# Patient Record
Sex: Female | Born: 1960 | Race: White | Hispanic: No | Marital: Married | Smoking: Never smoker
Health system: Southern US, Community
[De-identification: ages and names within clinical notes are randomized; demographics above are authoritative.]

## PROBLEM LIST (undated history)

## (undated) DIAGNOSIS — R740 Nonspecific elevation of levels of transaminase and lactic acid dehydrogenase [LDH]: Secondary | ICD-10-CM

## (undated) DIAGNOSIS — K2970 Gastritis, unspecified, without bleeding: Secondary | ICD-10-CM

## (undated) DIAGNOSIS — R7401 Elevation of levels of liver transaminase levels: Secondary | ICD-10-CM

## (undated) DIAGNOSIS — N951 Menopausal and female climacteric states: Secondary | ICD-10-CM

## (undated) DIAGNOSIS — J329 Chronic sinusitis, unspecified: Secondary | ICD-10-CM

## (undated) DIAGNOSIS — M549 Dorsalgia, unspecified: Secondary | ICD-10-CM

## (undated) DIAGNOSIS — J309 Allergic rhinitis, unspecified: Secondary | ICD-10-CM

## (undated) DIAGNOSIS — R7402 Elevation of levels of lactic acid dehydrogenase (LDH): Secondary | ICD-10-CM

## (undated) DIAGNOSIS — K2990 Gastroduodenitis, unspecified, without bleeding: Secondary | ICD-10-CM

## (undated) DIAGNOSIS — M2550 Pain in unspecified joint: Secondary | ICD-10-CM

## (undated) DIAGNOSIS — S92909A Unspecified fracture of unspecified foot, initial encounter for closed fracture: Secondary | ICD-10-CM

## (undated) DIAGNOSIS — R51 Headache: Secondary | ICD-10-CM

## (undated) HISTORY — DX: Pain in unspecified joint: M25.50

## (undated) HISTORY — PX: COLONOSCOPY: SHX174

## (undated) HISTORY — DX: Headache: R51

## (undated) HISTORY — DX: Gastroduodenitis, unspecified, without bleeding: K29.90

## (undated) HISTORY — DX: Allergic rhinitis, unspecified: J30.9

## (undated) HISTORY — DX: Unspecified fracture of unspecified foot, initial encounter for closed fracture: S92.909A

## (undated) HISTORY — DX: Nonspecific elevation of levels of transaminase and lactic acid dehydrogenase (ldh): R74.0

## (undated) HISTORY — DX: Chronic sinusitis, unspecified: J32.9

## (undated) HISTORY — DX: Dorsalgia, unspecified: M54.9

## (undated) HISTORY — DX: Gastritis, unspecified, without bleeding: K29.70

## (undated) HISTORY — DX: Elevation of levels of liver transaminase levels: R74.01

## (undated) HISTORY — DX: Elevation of levels of lactic acid dehydrogenase (LDH): R74.02

## (undated) HISTORY — DX: Menopausal and female climacteric states: N95.1

---

## 1999-09-12 ENCOUNTER — Other Ambulatory Visit: Admission: RE | Admit: 1999-09-12 | Discharge: 1999-09-12 | Payer: Self-pay | Admitting: Obstetrics & Gynecology

## 2000-10-14 ENCOUNTER — Other Ambulatory Visit: Admission: RE | Admit: 2000-10-14 | Discharge: 2000-10-14 | Payer: Self-pay | Admitting: Obstetrics & Gynecology

## 2000-12-27 ENCOUNTER — Encounter: Payer: Self-pay | Admitting: Internal Medicine

## 2000-12-27 ENCOUNTER — Encounter: Admission: RE | Admit: 2000-12-27 | Discharge: 2000-12-27 | Payer: Self-pay | Admitting: Internal Medicine

## 2001-10-21 ENCOUNTER — Other Ambulatory Visit: Admission: RE | Admit: 2001-10-21 | Discharge: 2001-10-21 | Payer: Self-pay | Admitting: Obstetrics & Gynecology

## 2002-01-26 ENCOUNTER — Emergency Department (HOSPITAL_COMMUNITY): Admission: EM | Admit: 2002-01-26 | Discharge: 2002-01-26 | Payer: Self-pay | Admitting: Emergency Medicine

## 2002-01-26 ENCOUNTER — Encounter: Payer: Self-pay | Admitting: Emergency Medicine

## 2002-05-29 ENCOUNTER — Encounter: Payer: Self-pay | Admitting: Obstetrics & Gynecology

## 2002-05-29 ENCOUNTER — Encounter: Admission: RE | Admit: 2002-05-29 | Discharge: 2002-05-29 | Payer: Self-pay | Admitting: Obstetrics & Gynecology

## 2002-11-18 ENCOUNTER — Other Ambulatory Visit: Admission: RE | Admit: 2002-11-18 | Discharge: 2002-11-18 | Payer: Self-pay | Admitting: Obstetrics & Gynecology

## 2003-09-01 ENCOUNTER — Ambulatory Visit: Admission: RE | Admit: 2003-09-01 | Discharge: 2003-09-01 | Payer: Self-pay | Admitting: Orthopedic Surgery

## 2003-10-13 ENCOUNTER — Emergency Department (HOSPITAL_COMMUNITY): Admission: EM | Admit: 2003-10-13 | Discharge: 2003-10-13 | Payer: Self-pay | Admitting: Emergency Medicine

## 2003-11-23 ENCOUNTER — Other Ambulatory Visit: Admission: RE | Admit: 2003-11-23 | Discharge: 2003-11-23 | Payer: Self-pay | Admitting: Obstetrics and Gynecology

## 2003-11-30 ENCOUNTER — Encounter: Admission: RE | Admit: 2003-11-30 | Discharge: 2003-11-30 | Payer: Self-pay | Admitting: Orthopedic Surgery

## 2003-11-30 IMAGING — NM NM BONE 3 PHASE
10 series · 10 of 10 positions shown · non-contrast
Comparison: none

CLINICAL DATA: Tendonitis.  Rule out stress fracture.

[st statics, dual detec · 2.33mm/px · 1 of 1 slices shown (1 of 5)]
[im 1/1]
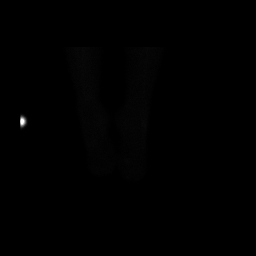

[bf bone flow · 2.36mm/px · 1 of 1 slices shown (1 of 5)]
[im 1/1]
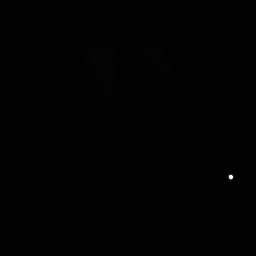

[bf bone flow · 2.33mm/px · 1 of 1 slices shown (2 of 5)]
[im 1/1]
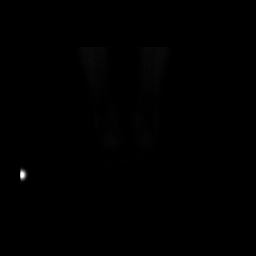

[st statics, dual detec · 2.36mm/px · 1 of 1 slices shown (2 of 5)]
[im 1/1]
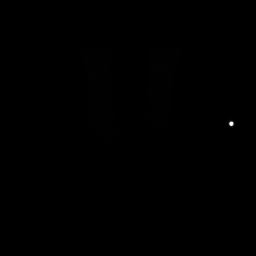

[st statics, dual detec · 2.33mm/px · 1 of 1 slices shown (3 of 5)]
[im 1/1]
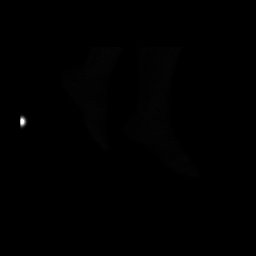

[st statics, dual detec · 2.36mm/px · 1 of 1 slices shown (4 of 5)]
[im 1/1]
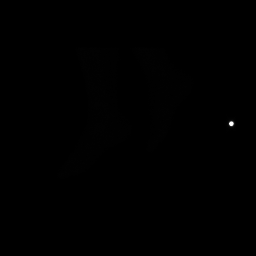

[bf bone flow · 2.33mm/px · 1 of 1 slices shown (3 of 5)]
[im 1/1]
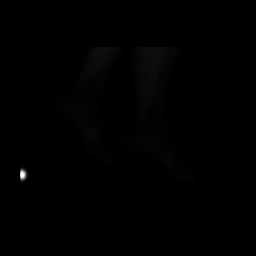

[bf bone flow · 2.36mm/px · 1 of 1 slices shown (4 of 5)]
[im 1/1]
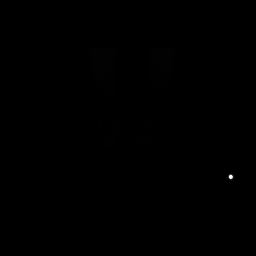

[st statics, dual detec · 2.36mm/px · 1 of 1 slices shown (5 of 5)]
[im 1/1]
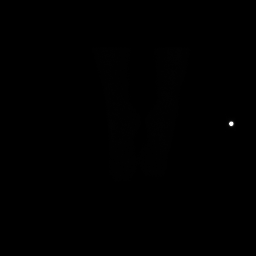

[bf bone flow · 2.36mm/px · 1 of 1 slices shown (5 of 5)]
[im 1/1]
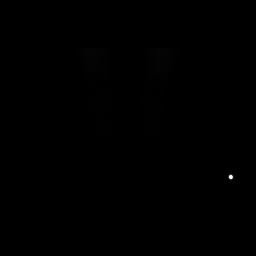

[10 of 10 positions shown; findings below may reference images not displayed]

NUCLEAR MEDICINE BONE SCAN ? THREE PHASE STUDY

 Following the IV injection of 23.1 mCi of [JQ] labeled MDP, flow scans were obtained followed by static soft tissue images and static bone images of the ankles and feet.  There are no areas of increased uptake seen to suggest a stress fracture.  Flow and soft tissue images do not show significant asymmetry. 

 IMPRESSION

 Normal study.

## 2003-12-30 ENCOUNTER — Ambulatory Visit (HOSPITAL_BASED_OUTPATIENT_CLINIC_OR_DEPARTMENT_OTHER): Admission: RE | Admit: 2003-12-30 | Discharge: 2003-12-30 | Payer: Self-pay | Admitting: Orthopedic Surgery

## 2003-12-30 ENCOUNTER — Encounter (INDEPENDENT_AMBULATORY_CARE_PROVIDER_SITE_OTHER): Payer: Self-pay | Admitting: *Deleted

## 2003-12-30 ENCOUNTER — Ambulatory Visit (HOSPITAL_COMMUNITY): Admission: RE | Admit: 2003-12-30 | Discharge: 2003-12-30 | Payer: Self-pay | Admitting: Orthopedic Surgery

## 2004-08-20 HISTORY — PX: ESOPHAGOGASTRODUODENOSCOPY: SHX1529

## 2004-08-20 HISTORY — PX: OTHER SURGICAL HISTORY: SHX169

## 2004-11-09 ENCOUNTER — Ambulatory Visit: Payer: Self-pay | Admitting: Family Medicine

## 2004-12-05 ENCOUNTER — Other Ambulatory Visit: Admission: RE | Admit: 2004-12-05 | Discharge: 2004-12-05 | Payer: Self-pay | Admitting: Obstetrics & Gynecology

## 2005-01-08 ENCOUNTER — Ambulatory Visit: Payer: Self-pay | Admitting: Family Medicine

## 2005-03-21 ENCOUNTER — Ambulatory Visit: Payer: Self-pay | Admitting: Family Medicine

## 2005-05-03 ENCOUNTER — Ambulatory Visit: Payer: Self-pay | Admitting: Family Medicine

## 2005-07-10 ENCOUNTER — Ambulatory Visit (HOSPITAL_COMMUNITY): Admission: EM | Admit: 2005-07-10 | Discharge: 2005-07-11 | Payer: Self-pay | Admitting: Emergency Medicine

## 2005-07-10 ENCOUNTER — Encounter (INDEPENDENT_AMBULATORY_CARE_PROVIDER_SITE_OTHER): Payer: Self-pay | Admitting: Specialist

## 2005-07-10 IMAGING — US US ABDOMEN COMPLETE
1 series · 14 of 25 positions shown · non-contrast
Comparison: None

CLINICAL DATA: Epigastric pain

ABDOMEN ULTRASOUND
TECHNIQUE: Complete abdominal ultrasound examination was performed including
evaluation of the liver, gallbladder, bile ducts, pancreas, kidneys, spleen,
IVC, and abdominal aorta.

[Series 1: unknown · 0.33mm/px · 14 of 50 slices shown]
[im 1/50]
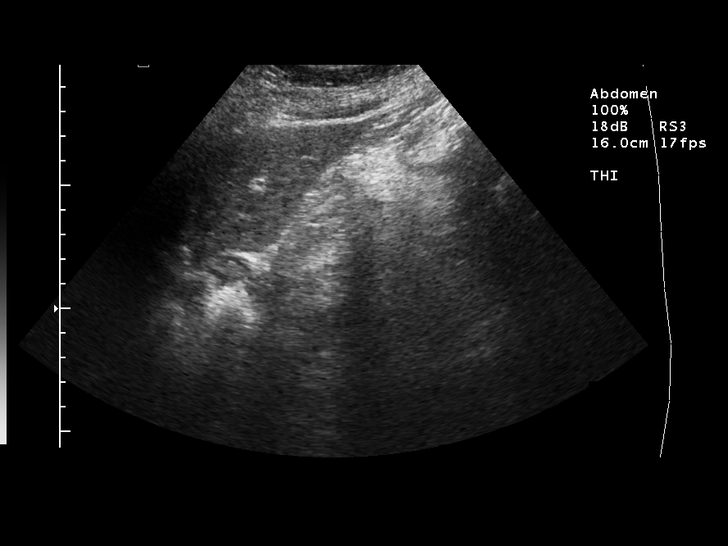
[im 5/50]
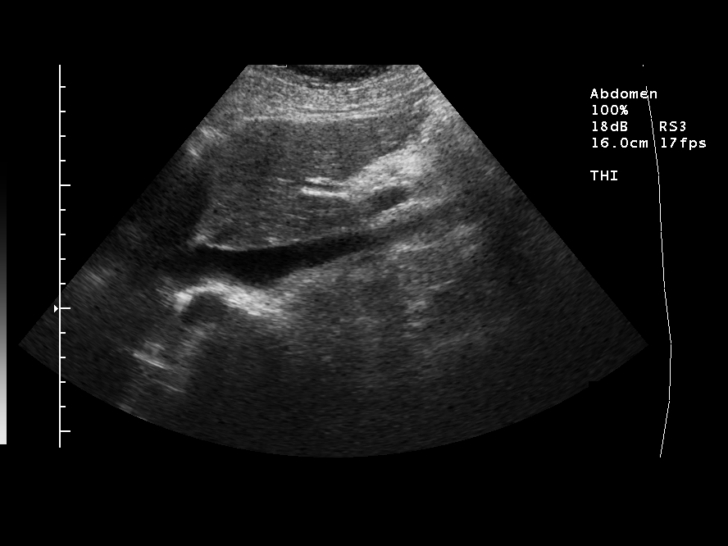
[im 9/50]
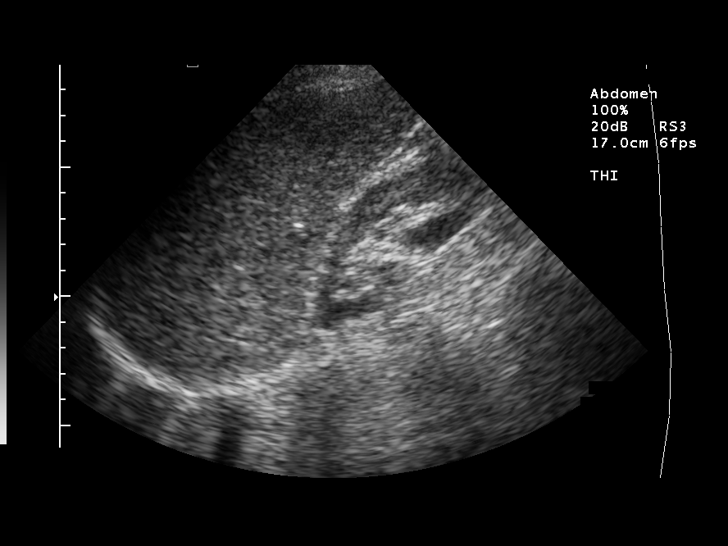
[im 13/50]
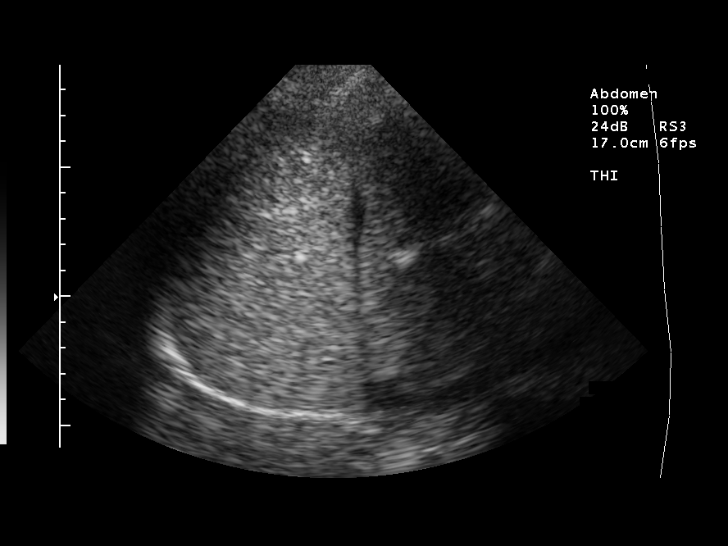
[im 17/50]
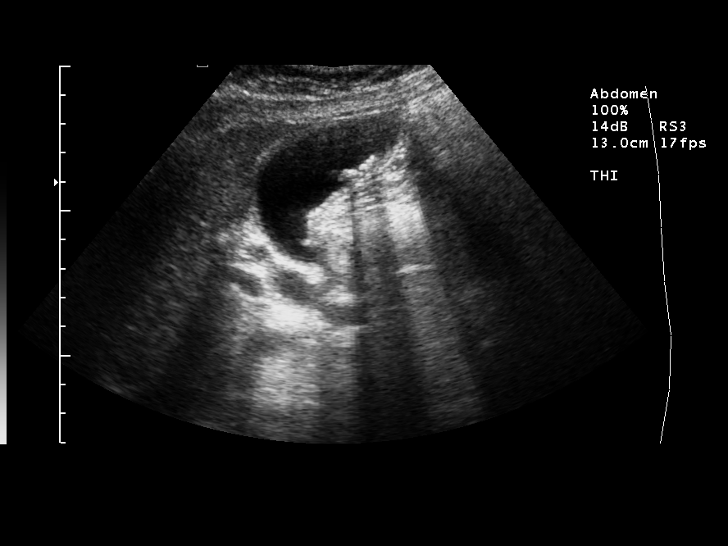
[im 19/50]
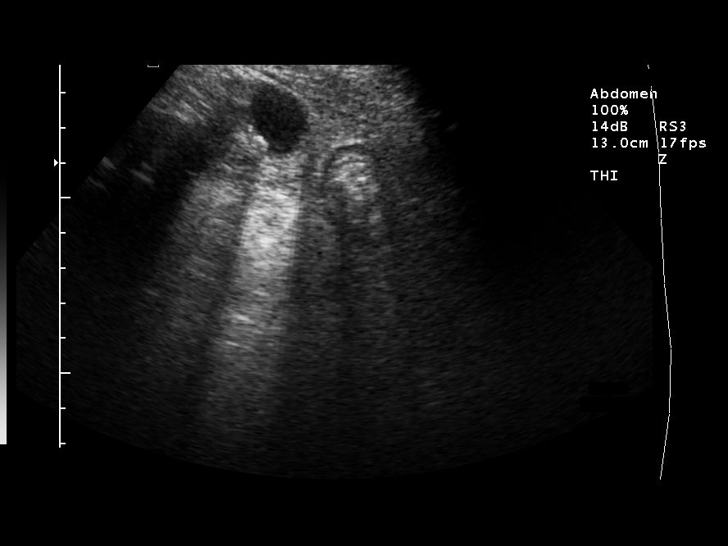
[im 23/50]
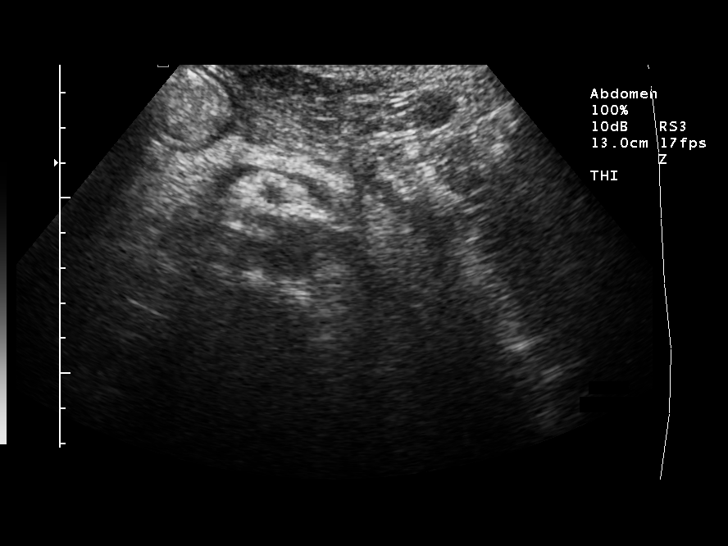
[im 27/50]
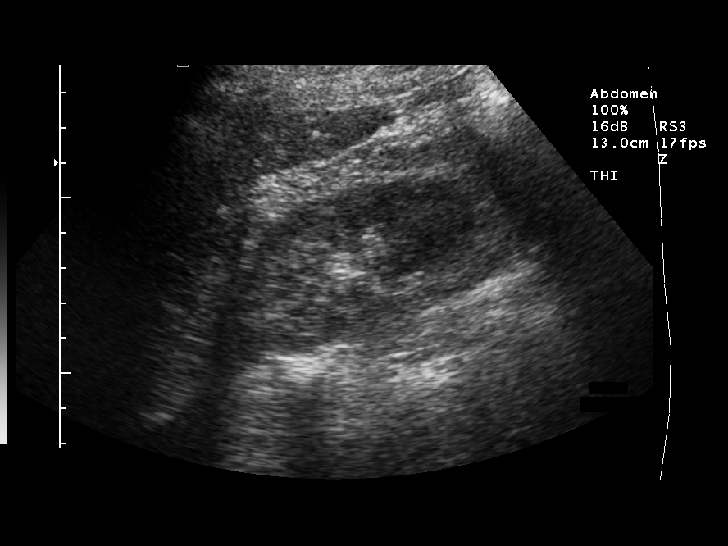
[im 31/50]
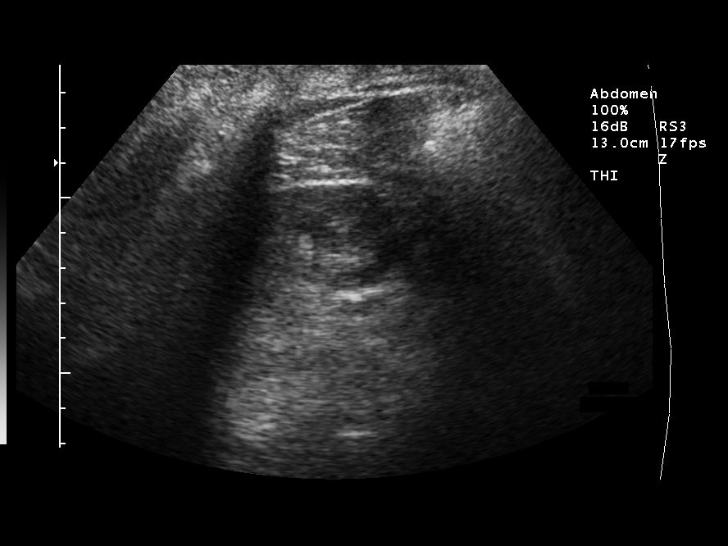
[im 33/50]
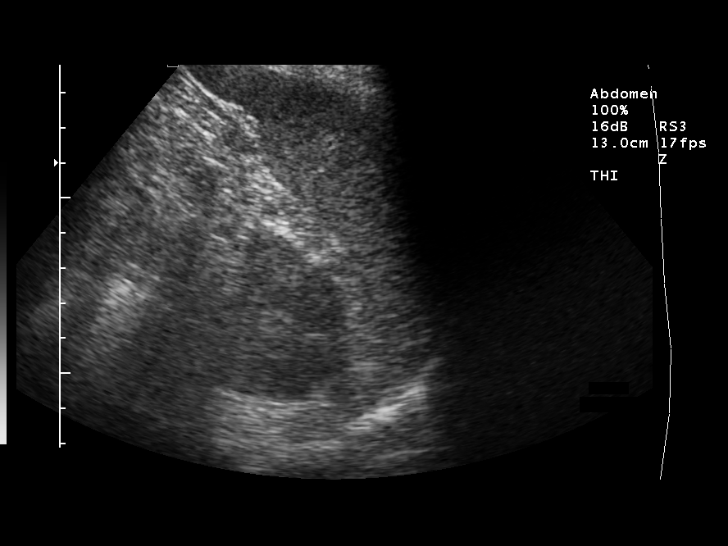
[im 37/50]
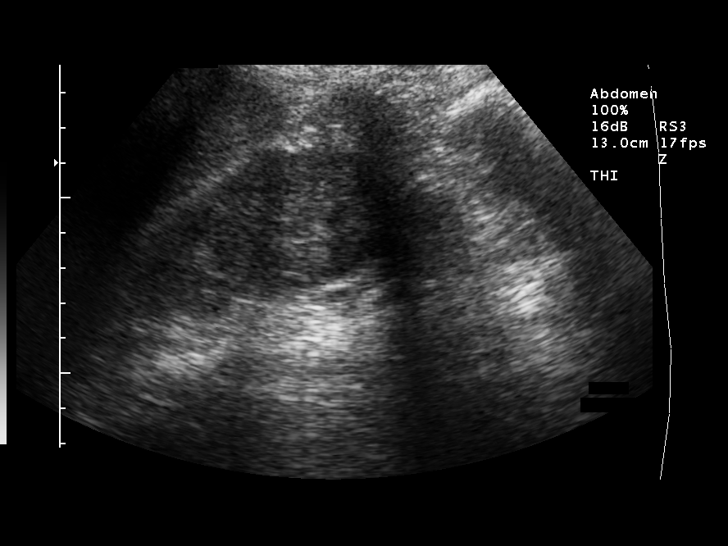
[im 41/50]
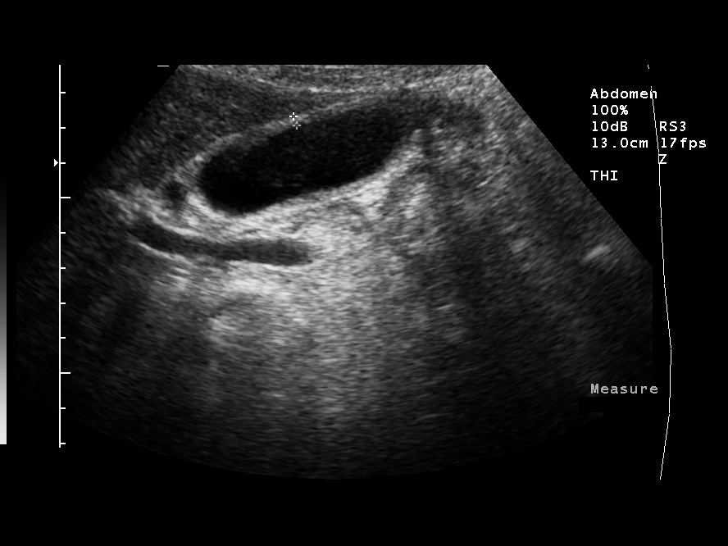
[im 45/50]
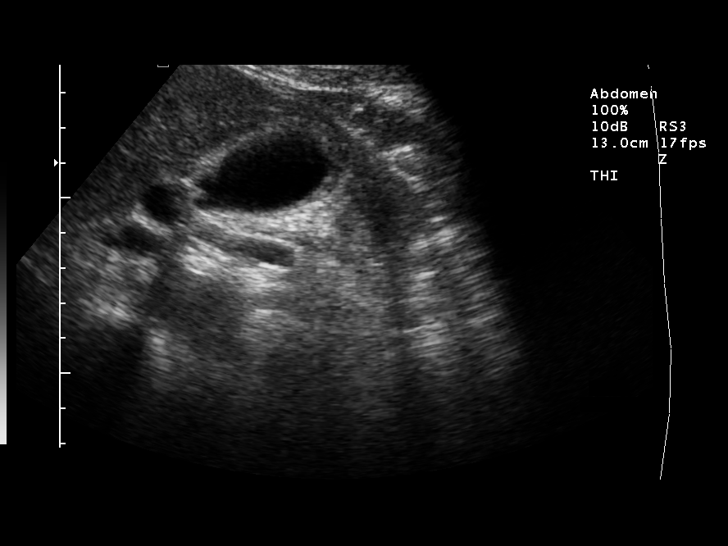
[im 50/50]
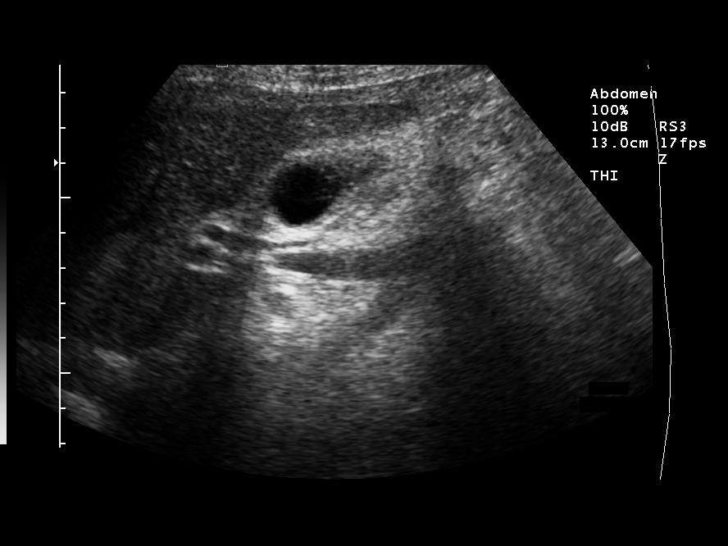

[14 of 25 positions shown; findings below may reference images not displayed]

FINDINGS: Small gallstones noted within the gallbladder. Gallbladder wall is
borderline in thickness at 3 mm. Common bile duct is normal at 5 mm. No focal
lesions in the liver, spleen, pancreas, or kidneys. Visualized IVC and abdominal
aorta unremarkable.

IMPRESSION

Cholelithiasis. Borderline gallbladder wall thickening. Can't exclude early
acute cholecystitis.

## 2005-07-10 IMAGING — CR DG CHEST 2V
2 series · 2 of 2 positions shown · non-contrast
Comparison: None.

CLINICAL DATA: Epigastric pain, colonoscopy yesterday.
 CHEST ? 2 VIEW:

[view not recorded (1 of 2)]
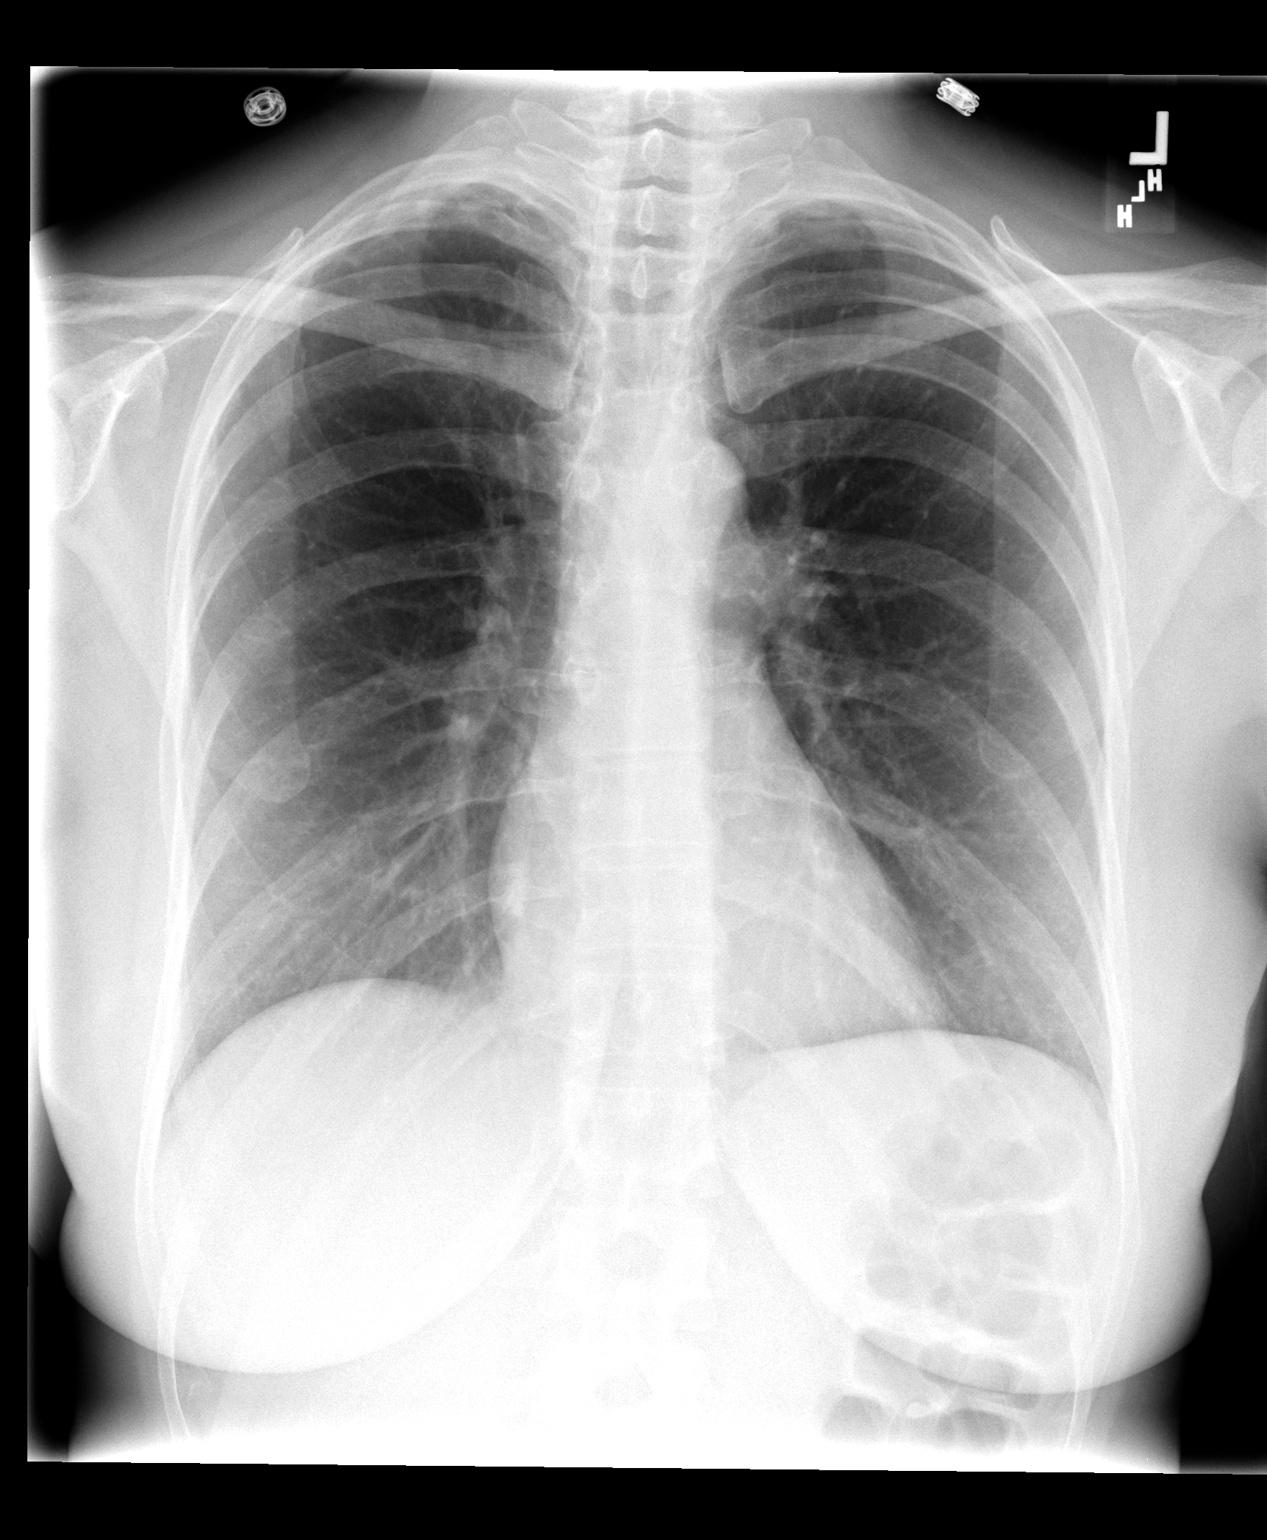

[view not recorded (2 of 2)]
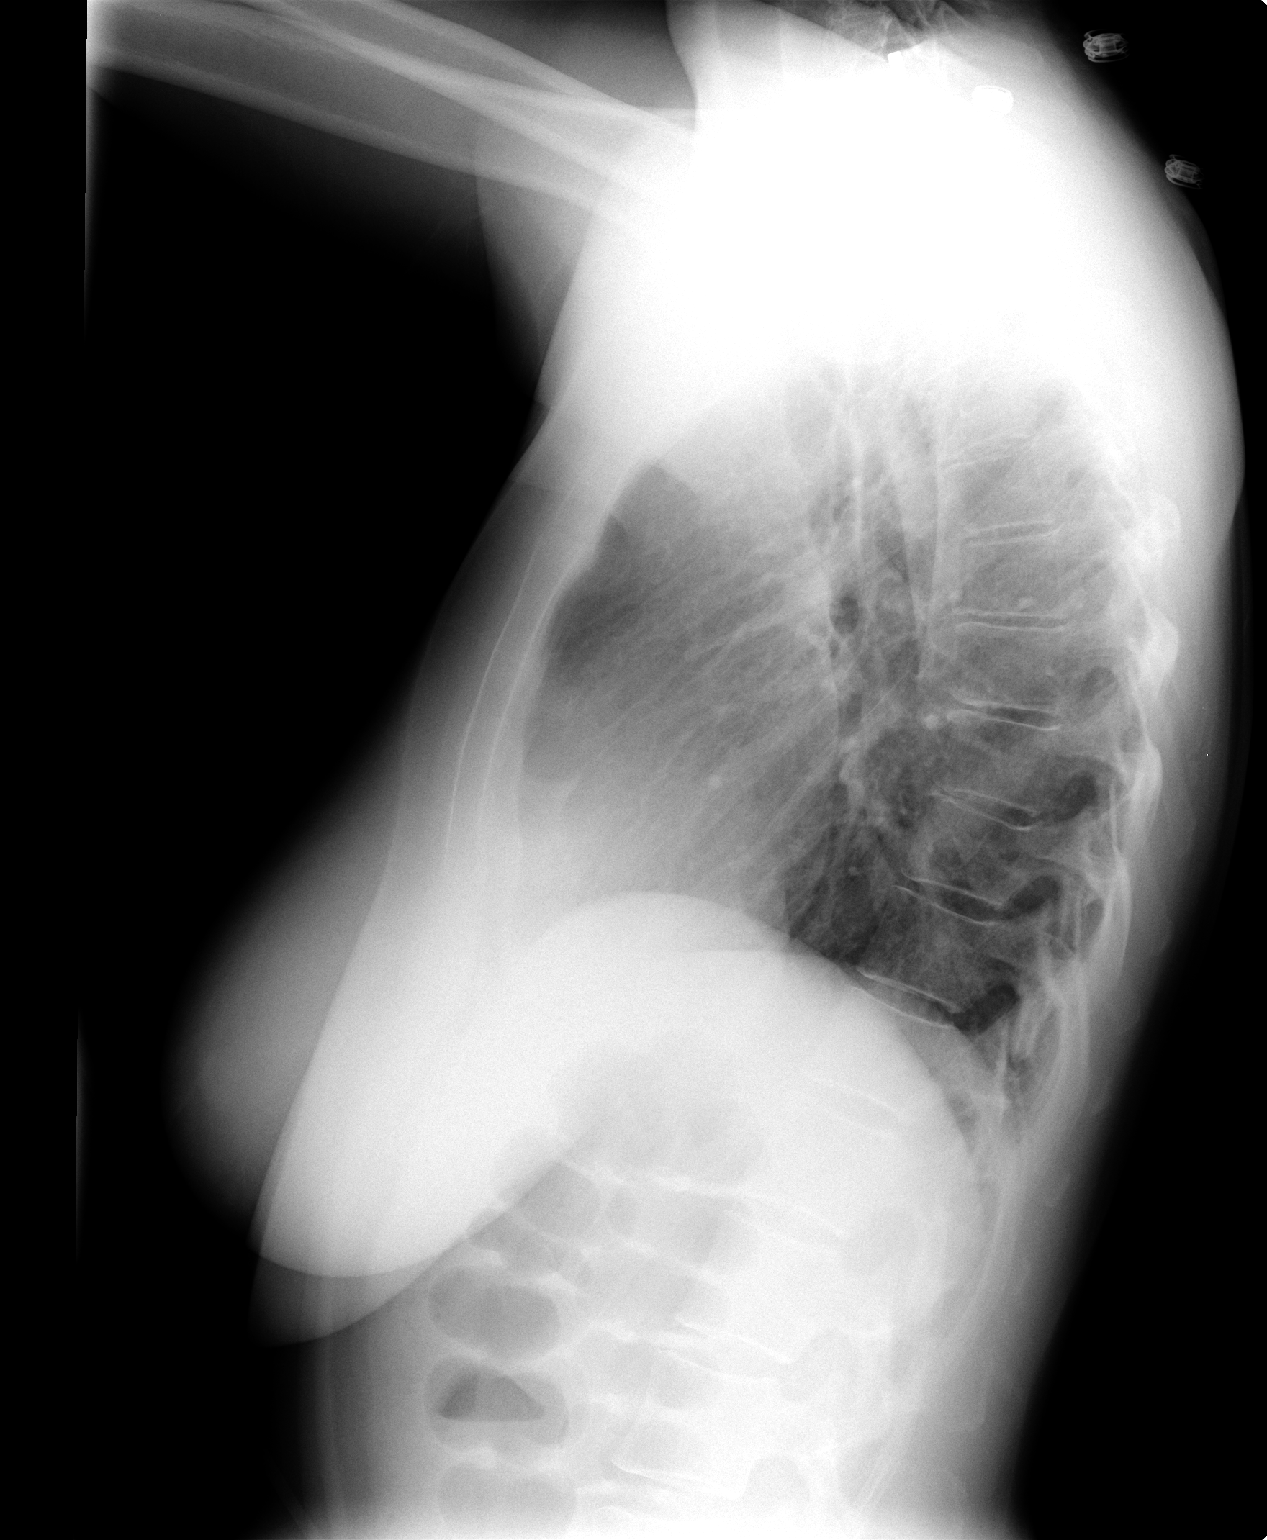

[2 of 2 positions shown; findings below may reference images not displayed]

FINDINGS: Heart size is normal.  There is no heart failure, infiltrate or effusion.  There is no free air under the diaphragm.  There is apical scarring bilaterally.
IMPRESSION: No acute abnormality.

## 2005-07-10 IMAGING — RF DG CHOLANGIOGRAM OPERATIVE
1 series · 3 of 3 positions shown · non-contrast
Comparison: none

CLINICAL DATA: Cholelithiasis.

INTRAOPERATIVE CHOLANGIOGRAM:
TECHNIQUE: Multiple fluoroscopic spot radiographs were obtained during
intraoperative cholangiogram, and are submitted for interpretation
post-operatively.

[Series 1: run · 3 of 3 slices shown]
[im 1/3]
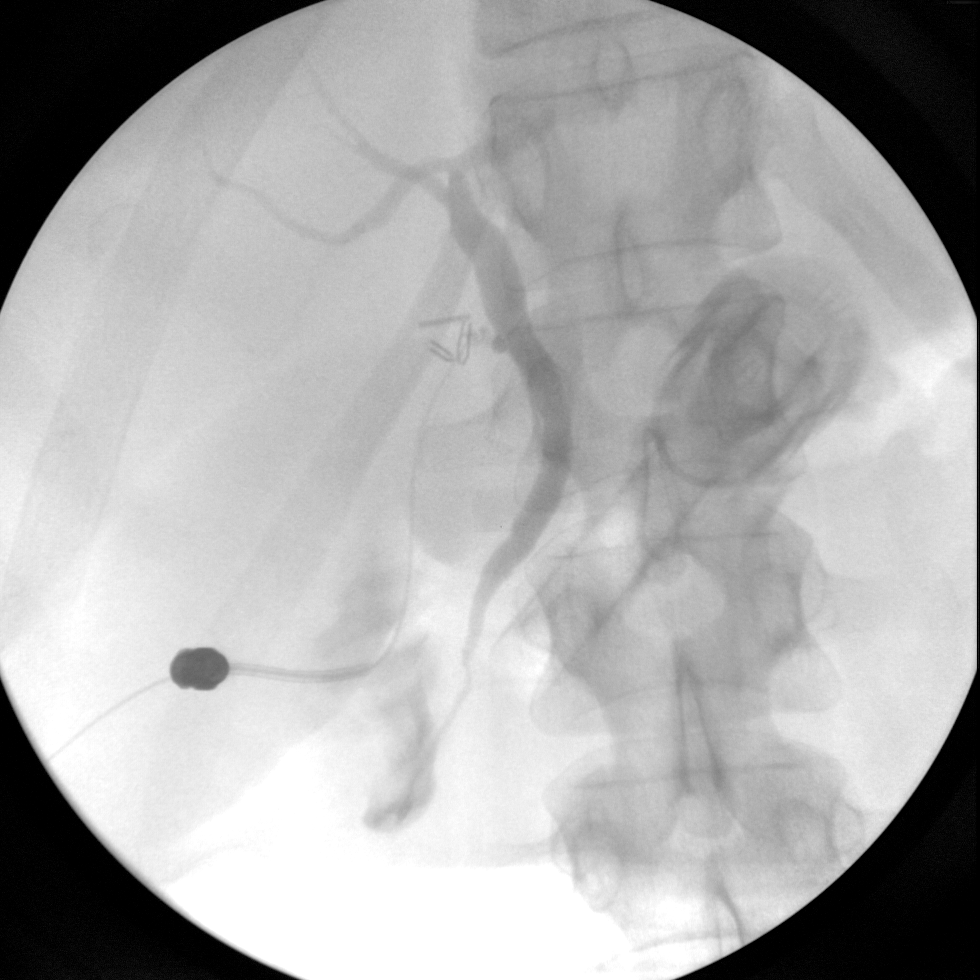
[im 2/3]
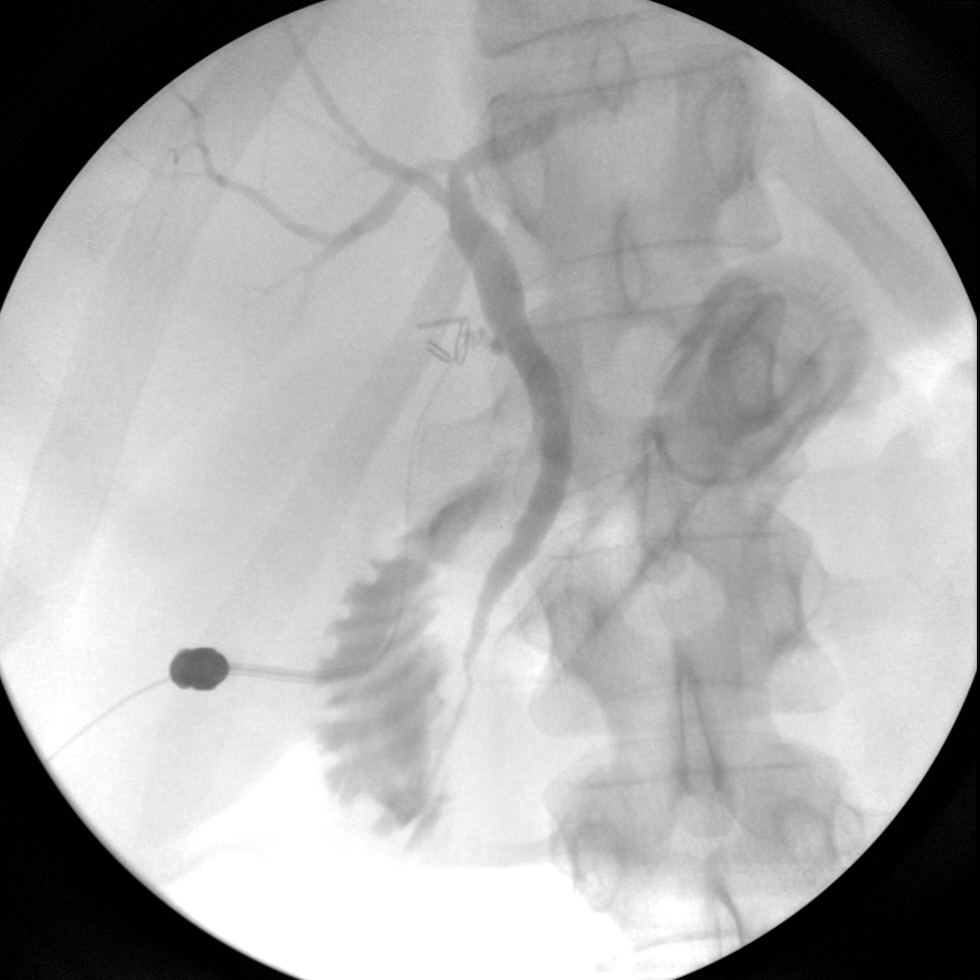
[im 3/3]
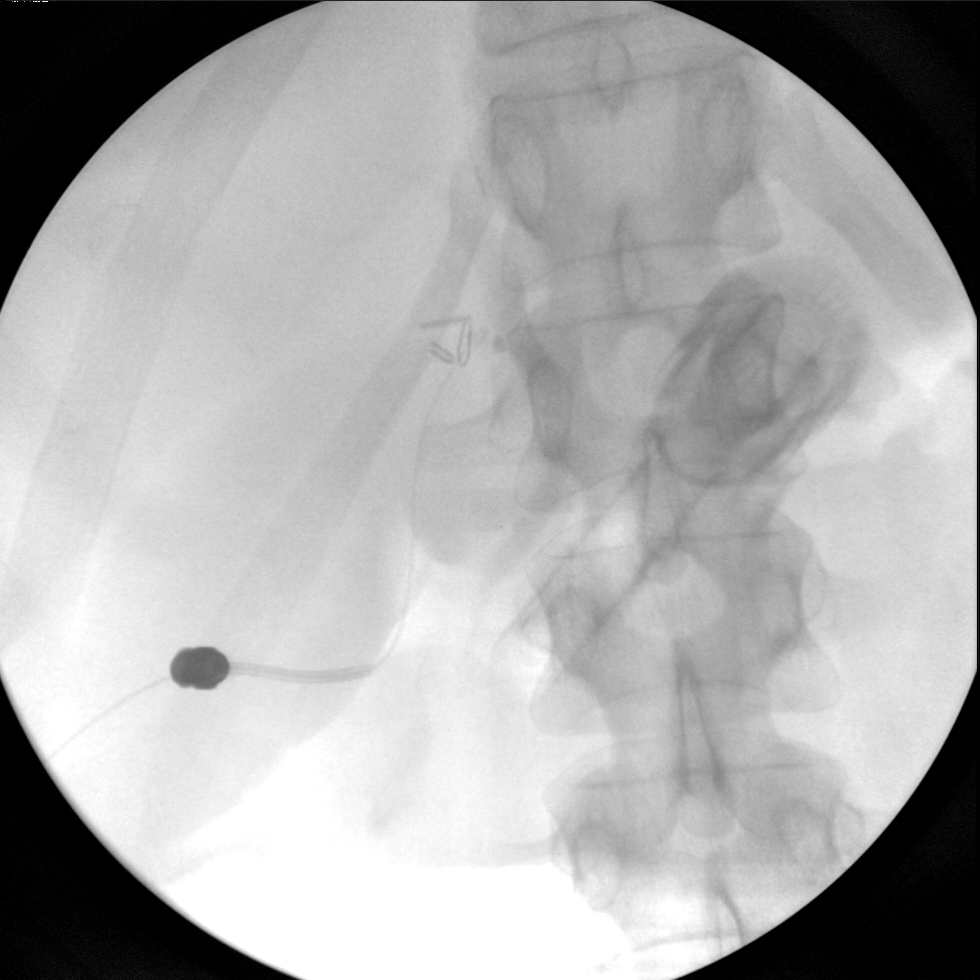

[3 of 3 positions shown; findings below may reference images not displayed]

FINDINGS: No calculi are identified within the common bile duct.  There is no
evidence of biliary stricture or obstruction.
IMPRESSION: Negative intraoperative cholangiogram.

## 2006-02-27 ENCOUNTER — Ambulatory Visit: Payer: Self-pay | Admitting: Family Medicine

## 2006-07-04 ENCOUNTER — Ambulatory Visit: Payer: Self-pay | Admitting: Family Medicine

## 2006-10-22 ENCOUNTER — Ambulatory Visit: Payer: Self-pay | Admitting: Family Medicine

## 2006-11-12 ENCOUNTER — Ambulatory Visit: Payer: Self-pay | Admitting: Family Medicine

## 2006-12-11 ENCOUNTER — Ambulatory Visit: Payer: Self-pay | Admitting: Family Medicine

## 2007-07-01 ENCOUNTER — Ambulatory Visit: Payer: Self-pay | Admitting: Family Medicine

## 2007-07-02 ENCOUNTER — Telehealth (INDEPENDENT_AMBULATORY_CARE_PROVIDER_SITE_OTHER): Payer: Self-pay | Admitting: *Deleted

## 2007-10-27 ENCOUNTER — Ambulatory Visit: Payer: Self-pay | Admitting: Family Medicine

## 2008-03-05 ENCOUNTER — Ambulatory Visit: Payer: Self-pay | Admitting: Family Medicine

## 2008-07-16 ENCOUNTER — Encounter: Admission: RE | Admit: 2008-07-16 | Discharge: 2008-07-16 | Payer: Self-pay | Admitting: Sports Medicine

## 2008-07-30 ENCOUNTER — Encounter: Admission: RE | Admit: 2008-07-30 | Discharge: 2008-07-30 | Payer: Self-pay | Admitting: Sports Medicine

## 2008-08-20 HISTORY — PX: FOOT FRACTURE SURGERY: SHX645

## 2008-09-16 ENCOUNTER — Ambulatory Visit: Payer: Self-pay | Admitting: Family Medicine

## 2008-09-16 DIAGNOSIS — K2970 Gastritis, unspecified, without bleeding: Secondary | ICD-10-CM | POA: Insufficient documentation

## 2008-09-16 DIAGNOSIS — J309 Allergic rhinitis, unspecified: Secondary | ICD-10-CM | POA: Insufficient documentation

## 2008-09-16 DIAGNOSIS — K2990 Gastroduodenitis, unspecified, without bleeding: Secondary | ICD-10-CM

## 2008-09-17 ENCOUNTER — Telehealth: Payer: Self-pay | Admitting: Family Medicine

## 2008-12-17 ENCOUNTER — Ambulatory Visit: Payer: Self-pay | Admitting: Family Medicine

## 2008-12-17 DIAGNOSIS — J019 Acute sinusitis, unspecified: Secondary | ICD-10-CM | POA: Insufficient documentation

## 2009-01-07 ENCOUNTER — Ambulatory Visit: Payer: Self-pay | Admitting: Family Medicine

## 2009-02-11 ENCOUNTER — Telehealth: Payer: Self-pay | Admitting: Family Medicine

## 2009-04-11 ENCOUNTER — Ambulatory Visit: Payer: Self-pay | Admitting: Family Medicine

## 2009-07-12 ENCOUNTER — Ambulatory Visit: Payer: Self-pay | Admitting: Family Medicine

## 2009-07-12 DIAGNOSIS — N951 Menopausal and female climacteric states: Secondary | ICD-10-CM

## 2009-07-21 ENCOUNTER — Telehealth: Payer: Self-pay | Admitting: Family Medicine

## 2009-11-29 ENCOUNTER — Encounter (INDEPENDENT_AMBULATORY_CARE_PROVIDER_SITE_OTHER): Payer: Self-pay | Admitting: *Deleted

## 2009-12-21 ENCOUNTER — Ambulatory Visit: Payer: Self-pay | Admitting: Family Medicine

## 2009-12-21 DIAGNOSIS — M2550 Pain in unspecified joint: Secondary | ICD-10-CM | POA: Insufficient documentation

## 2009-12-22 ENCOUNTER — Encounter: Payer: Self-pay | Admitting: Family Medicine

## 2009-12-26 LAB — CONVERTED CEMR LAB
BUN: 11 mg/dL (ref 6–23)
CO2: 32 meq/L (ref 19–32)
Chloride: 105 meq/L (ref 96–112)
Creatinine, Ser: 0.7 mg/dL (ref 0.4–1.2)
Eosinophils Absolute: 0.2 10*3/uL (ref 0.0–0.7)
Eosinophils Relative: 3.6 % (ref 0.0–5.0)
GFR calc non Af Amer: 90.81 mL/min (ref >60)
Glucose, Bld: 92 mg/dL (ref 70–99)
HCT: 41 % (ref 36.0–46.0)
Lymphs Abs: 1.8 10*3/uL (ref 0.7–4.0)
MCHC: 34.1 g/dL (ref 30.0–36.0)
Monocytes Relative: 6.2 % (ref 3.0–12.0)
Neutrophils Relative %: 59.7 % (ref 43.0–77.0)
Platelets: 297 10*3/uL (ref 150.0–400.0)
Potassium: 4.1 meq/L (ref 3.5–5.1)
RDW: 12.9 % (ref 11.5–14.6)
TSH: 2.46 microintl units/mL (ref 0.35–5.50)
Total Bilirubin: 0.4 mg/dL (ref 0.3–1.2)
Total Protein: 7.4 g/dL (ref 6.0–8.3)
WBC: 5.9 10*3/uL (ref 4.5–10.5)

## 2009-12-29 ENCOUNTER — Telehealth: Payer: Self-pay | Admitting: Family Medicine

## 2010-01-06 ENCOUNTER — Ambulatory Visit: Payer: Self-pay | Admitting: Family Medicine

## 2010-03-24 ENCOUNTER — Ambulatory Visit: Payer: Self-pay | Admitting: Family Medicine

## 2010-04-03 ENCOUNTER — Telehealth: Payer: Self-pay | Admitting: Family Medicine

## 2010-04-10 ENCOUNTER — Ambulatory Visit: Payer: Self-pay | Admitting: Family Medicine

## 2010-04-10 ENCOUNTER — Ambulatory Visit: Payer: Self-pay | Admitting: Cardiology

## 2010-04-10 ENCOUNTER — Telehealth: Payer: Self-pay | Admitting: Family Medicine

## 2010-04-10 DIAGNOSIS — R51 Headache: Secondary | ICD-10-CM

## 2010-04-10 DIAGNOSIS — R519 Headache, unspecified: Secondary | ICD-10-CM | POA: Insufficient documentation

## 2010-04-10 IMAGING — CT CT PARANASAL SINUSES LIMITED
1 of 2 series · 16 of 25 positions shown, 20 images · non-contrast
Comparison: None.

CLINICAL DATA: Left frontal and facial pain and pressure over the
past 2 weeks which did not resolve with antibiotic therapy.

CT PARANASAL SINUS LIMITED WITHOUT CONTRAST [DATE]:
TECHNIQUE: Multidetector CT images of the paranasal sinuses were
obtained in a single coronal plane without contrast.  A metallic BB
was placed on the right supraorbital region in order to reliably
differentiate right from left.

[Series 4: ltd sinus 3.0 h30s · axial · 0.29mm/px · z∈[-112,-14]mm · 16 of 24 slices shown, 20 images]
[im 2/24  brain]
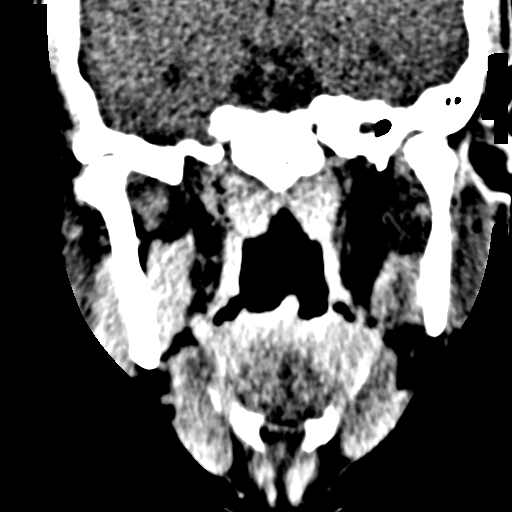
[im 2/24  bone]
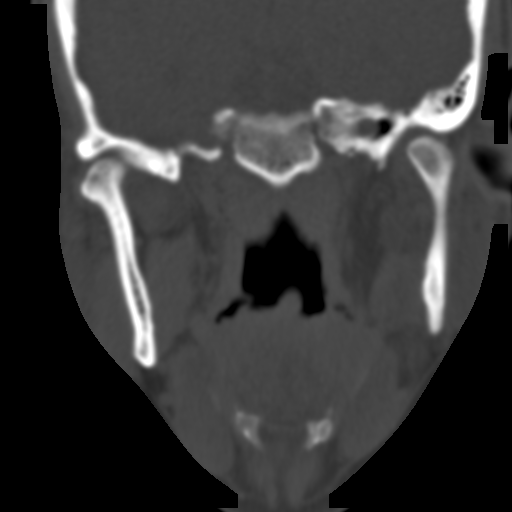
[im 4/24  bone]
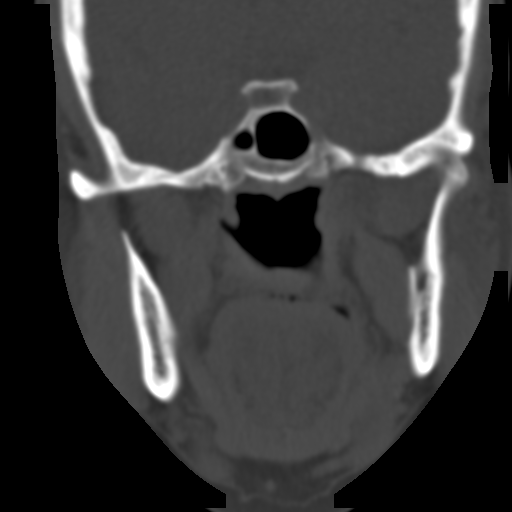
[im 5/24  bone]
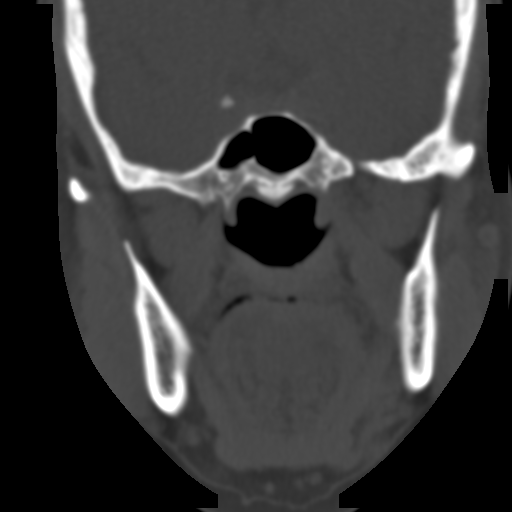
[im 6/24  bone]
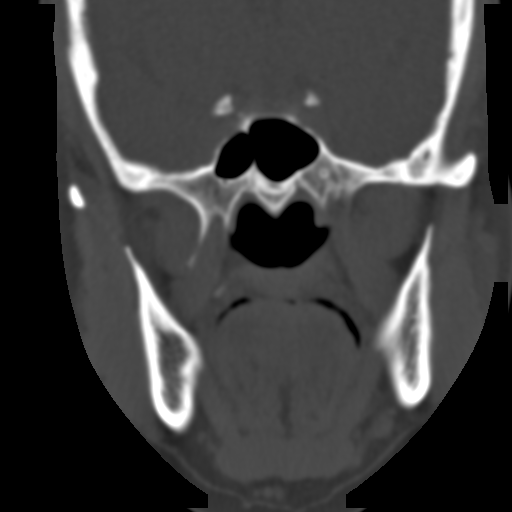
[im 8/24  brain]
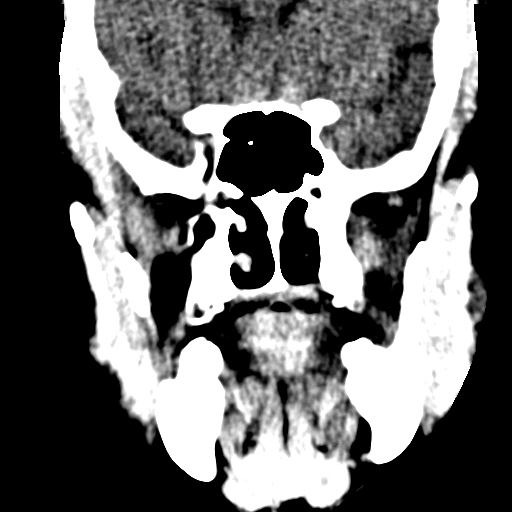
[im 8/24  bone]
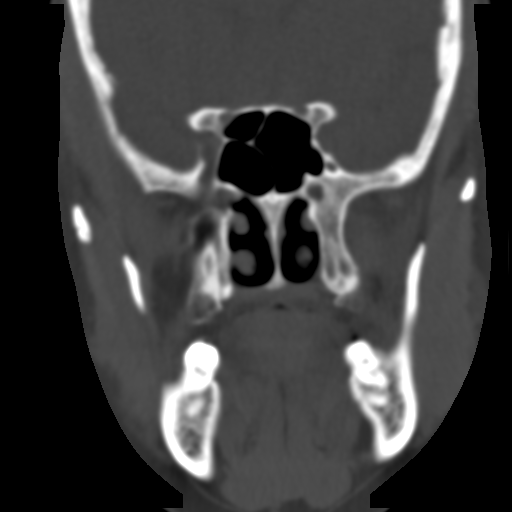
[im 9/24  bone]
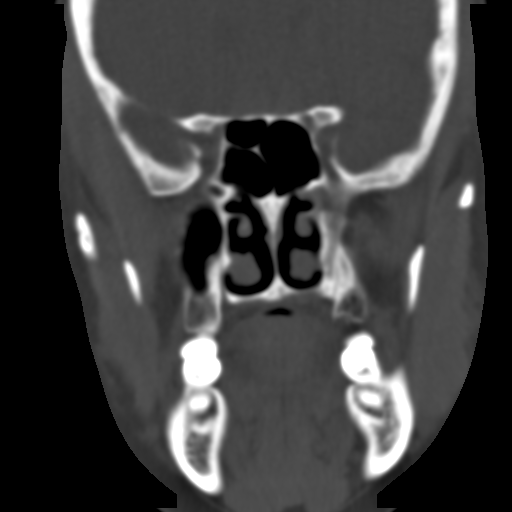
[im 10/24  bone]
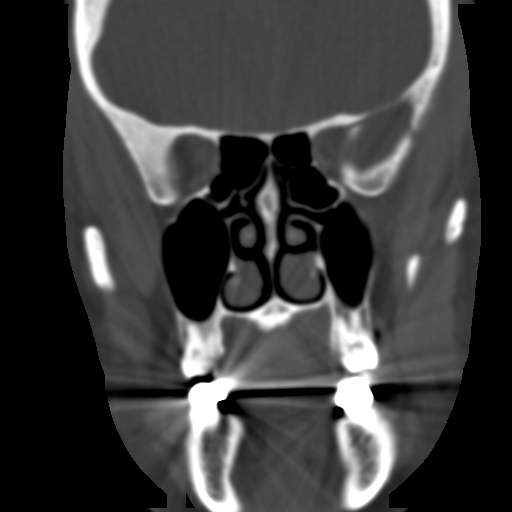
[im 12/24  bone]
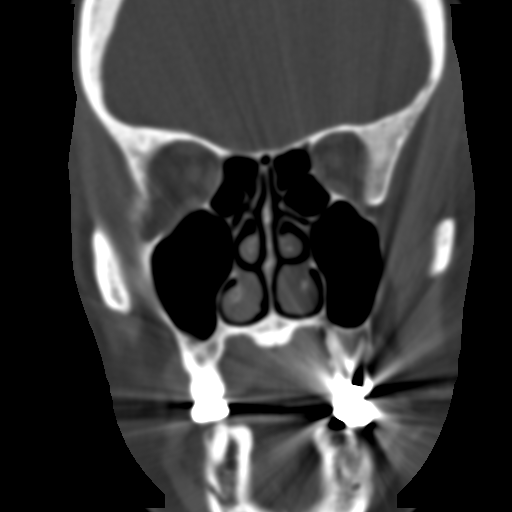
[im 13/24  brain]
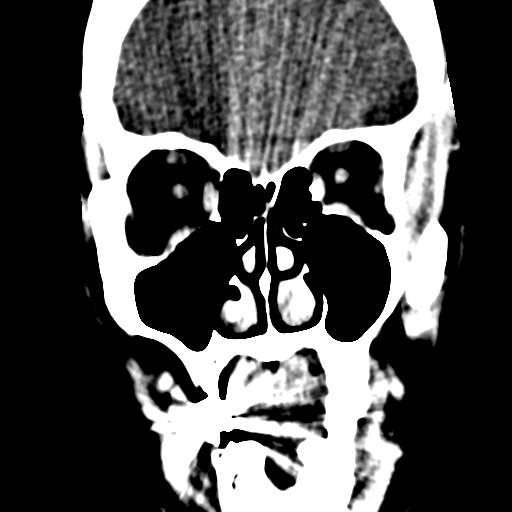
[im 13/24  bone]
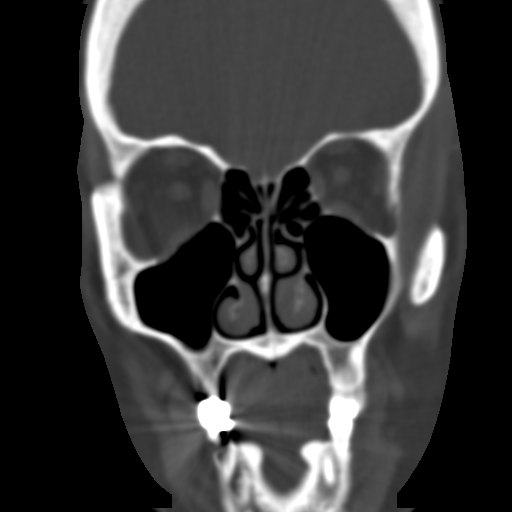
[im 15/24  bone]
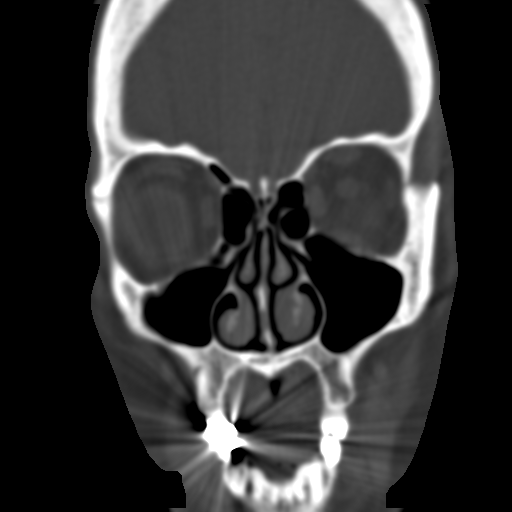
[im 16/24  bone]
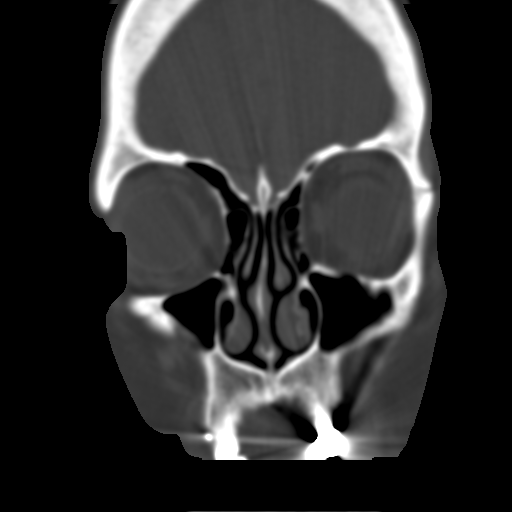
[im 17/24  bone]
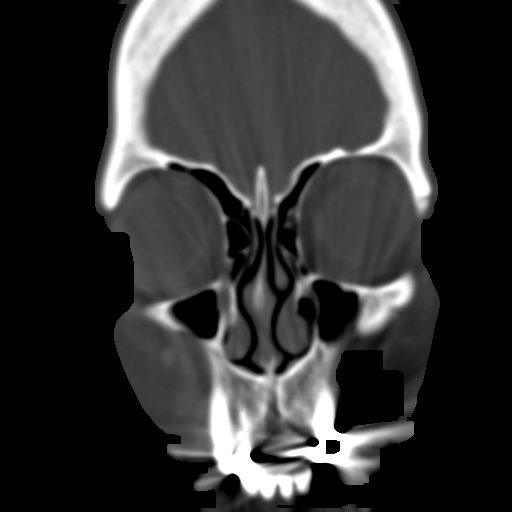
[im 19/24  brain]
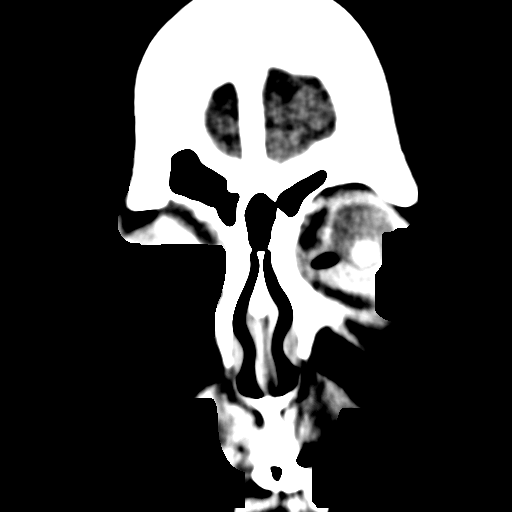
[im 19/24  bone]
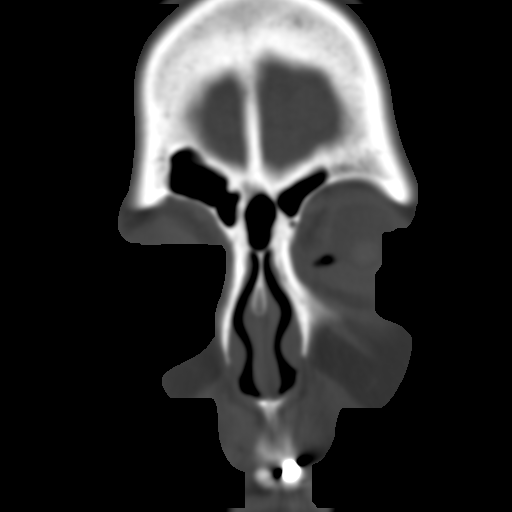
[im 20/24  bone]
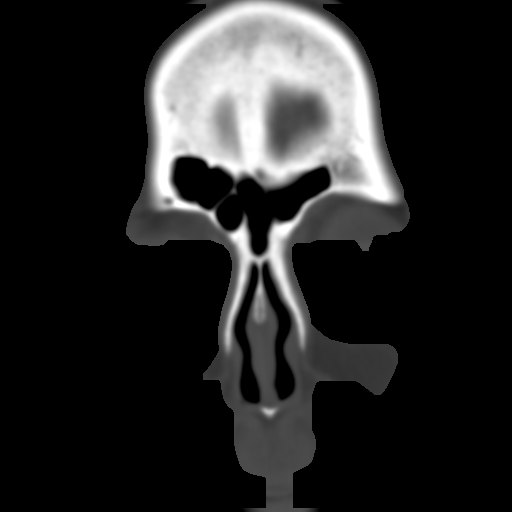
[im 21/24  bone]
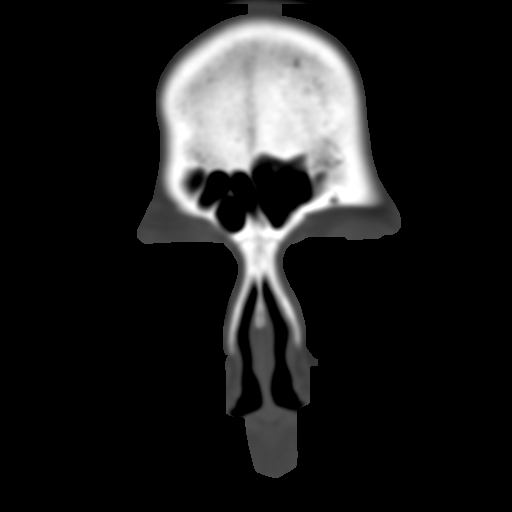
[im 23/24  bone]
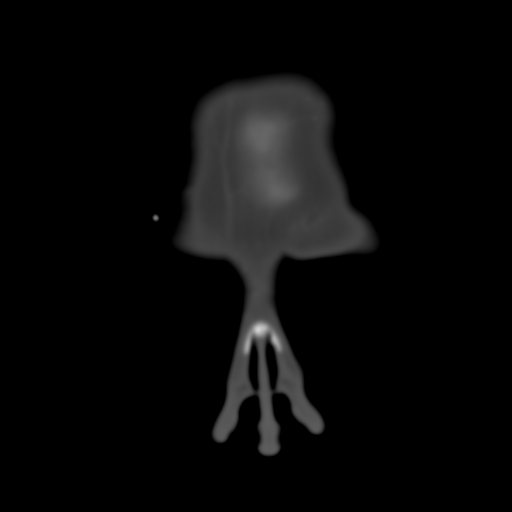

[16 of 25 positions shown; findings below may reference images not displayed]

FINDINGS: All of the paranasal sinuses are well-aerated.  No
evidence of mucous retention cysts or polyps, mucosal thickening,
or air-fluid levels.  Bony nasal septum midline.  No visible
anatomic variants.  Visualized temporomandibular joints intact.
IMPRESSION: No evidence of sinusitis.

## 2010-04-11 LAB — CONVERTED CEMR LAB
Basophils Absolute: 0 10*3/uL (ref 0.0–0.1)
Basophils Relative: 0.5 % (ref 0.0–3.0)
Eosinophils Absolute: 0.1 10*3/uL (ref 0.0–0.7)
Eosinophils Relative: 1.4 % (ref 0.0–5.0)
HCT: 42.6 % (ref 36.0–46.0)
Lymphocytes Relative: 26.8 % (ref 12.0–46.0)
Lymphs Abs: 1.6 10*3/uL (ref 0.7–4.0)
Monocytes Absolute: 0.3 10*3/uL (ref 0.1–1.0)
Neutro Abs: 3.8 10*3/uL (ref 1.4–7.7)
Neutrophils Relative %: 65.4 % (ref 43.0–77.0)
WBC: 5.9 10*3/uL (ref 4.5–10.5)

## 2010-08-22 ENCOUNTER — Telehealth: Payer: Self-pay | Admitting: Family Medicine

## 2010-08-25 ENCOUNTER — Encounter: Payer: Self-pay | Admitting: Family Medicine

## 2010-08-25 ENCOUNTER — Ambulatory Visit
Admission: RE | Admit: 2010-08-25 | Discharge: 2010-08-25 | Payer: Self-pay | Source: Home / Self Care | Attending: Family Medicine | Admitting: Family Medicine

## 2010-08-25 DIAGNOSIS — R740 Nonspecific elevation of levels of transaminase and lactic acid dehydrogenase [LDH]: Secondary | ICD-10-CM

## 2010-08-30 LAB — CONVERTED CEMR LAB
Alkaline Phosphatase: 80 units/L (ref 39–117)
Bilirubin, Direct: 0.1 mg/dL (ref 0.0–0.3)

## 2010-09-08 ENCOUNTER — Ambulatory Visit
Admission: RE | Admit: 2010-09-08 | Discharge: 2010-09-08 | Payer: Self-pay | Source: Home / Self Care | Attending: Family Medicine | Admitting: Family Medicine

## 2010-09-10 ENCOUNTER — Encounter: Payer: Self-pay | Admitting: Sports Medicine

## 2010-09-13 ENCOUNTER — Encounter: Payer: Self-pay | Admitting: Family Medicine

## 2010-09-19 NOTE — Assessment & Plan Note (Signed)
Summary: ROA FOR 2 WEEK FOLLOW-UP/JRR   Vital Signs:  Patient profile:   50 year old female Height:      64 inches Weight:      157.50 pounds BMI:     27.13 Temp:     97.8 degrees F oral Pulse rate:   76 / minute Pulse rhythm:   regular BP sitting:   126 / 82  (left arm) Cuff size:   regular  Vitals Entered By: Lewanda Rife LPN (Jan 06, 2010 9:05 AM) CC: 2 wk f/u after labs   History of Present Illness: here for f/u of menopausal symptoms/ joint and muscle pain and swelling  labs came back nl -- rev all of them incl rheumatoid labs   stopped her aleve tried diff forms of tylenol -- that helps a little  stopping the aleve did not help swelling very much   had headache from zyrtec -- then tried allegra or claritin and they did not work  using nyquil- that helps more  now is having significant facial pain and malaise no fever  she thinks she is getting a sinus infection  fatigue -- is working long hours  is getting exercise machine -- elliptical - she is excited about that    wt is stable   bp s 126/82  wants to wait on diuretic - cannot urinate frequently with her lifestyle and work schedule    Allergies: 1)  ! Codeine 2)  ! Celebrex 3)  ! Ultram 4)  ! Morphine 5)  ! Darvocet 6)  ! Ibuprofen  Past History:  Past Medical History: Last updated: 07/12/2009 back pain - with epidural injections gastritis rhinitis    ortho-- Farris Has  GI [id number]  Past Surgical History: Last updated: 12/21/2009 2006 [facility] EGD- gastritis 2006 colonoscopy EGD gastritis foot fx 2010  Family History: Last updated: 12/21/2009 father disc dz  mother  father's side - arthitis , disc dz brother disk dz   Social History: Last updated: 09/16/2008 non smoker  has children  Review of Systems General:  Complains of fatigue; denies fever, loss of appetite, and malaise. Eyes:  Denies blurring and eye pain. ENT:  Complains of nasal congestion, postnasal drainage, and sinus  pressure; denies earache. CV:  Complains of swelling of feet and weight gain; denies chest pain or discomfort, palpitations, shortness of breath with exertion, and swelling of hands. Resp:  Denies cough, shortness of breath, and wheezing. GI:  Denies abdominal pain, bloody stools, change in bowel habits, and nausea. GU:  Denies urinary frequency. MS:  Complains of joint pain and stiffness; denies cramps. Derm:  Denies itching, lesion(s), poor wound healing, and rash. Neuro:  Denies headaches, numbness, tingling, and weakness. Psych:  mood is about the same . Endo:  Complains of heat intolerance; denies cold intolerance, excessive thirst, and excessive urination. Heme:  Denies abnormal bruising and bleeding.  Physical Exam  General:  Well-developed,well-nourished,in no acute distress; alert,appropriate and cooperative throughout examination Head:  marked frontal sinus tenderness normocephalic, atraumatic, and no abnormalities observed.   Eyes:  vision grossly intact, pupils equal, pupils round, and pupils reactive to light.  no conjunctival pallor, injection or icterus  Ears:  R ear normal and L ear normal.   Nose:  nares are injected and congested bilaterally  Mouth:  pharynx pink and moist.  - some post nasal drainage Neck:  supple with full rom and no masses or thyromegally, no JVD or carotid bruit  Chest Wall:  No deformities, masses, or  tenderness noted. Lungs:  Normal respiratory effort, chest expands symmetrically. Lungs are clear to auscultation, no crackles or wheezes. Heart:  Normal rate and regular rhythm. S1 and S2 normal without gallop, murmur, click, rub or other extra sounds. Msk:  no acute joint tenderness nl rom spine Pulses:  R and L carotid,radial,femoral,dorsalis pedis and posterior tibial pulses are full and equal bilaterally Extremities:  trace edema in ankles  Neurologic:  sensation intact to light touch, gait normal, and DTRs symmetrical and normal.   Skin:   Intact without suspicious lesions or rashes Cervical Nodes:  No lymphadenopathy noted Psych:  normal affect, talkative and pleasant    Impression & Recommendations:  Problem # 1:  SINUSITIS - ACUTE-NOS (ICD-461.9) Assessment New  will tx with augmentin and sympt care update if not imp next week  Her updated medication list for this problem includes:    Flonase 50 Mcg/act Susp (Fluticasone propionate) .Marland Kitchen... 2 sprays in each nostril once daily as needed    Nyquil D Cold/flu 60-12.01-16-999 Mg/52ml Liqd (Pseudoeph-doxylamine-dm-apap) ..... Otc as directed.    Augmentin 875-125 Mg Tabs (Amoxicillin-pot clavulanate) .Marland Kitchen... 1 by mouth two times a day for 10 days for sinus infection  Orders: Prescription Created Electronically [phone])  Problem # 2:  EDEMA (ICD-782.3) Assessment: Improved is tolerable -- adv to avoid nsaids and excess sodium handout given from [facility] on DASH diet  elevate feet  stay active not interested in diuretic at this time lab rev in detail  Problem # 3:  FATIGUE (ICD-780.79) Assessment: Unchanged likely multifactorial with menopause and schedule rev labs  watch for s/s of depression start exercise as planned   Problem # 4:  ARTHRALGIA (ICD-719.40) Assessment: Improved is imp slt with tylenol rev lab  wil call for rheum consult if desired exercise low impact recommended   Problem # 5:  MENOPAUSAL SYNDROME (ICD-627.2) Assessment: Unchanged will disc with her gyn -- with mult symptoms The following medications were removed from the medication list:    Estrace 1 Mg Tabs (Estradiol) .Marland Kitchen... Take twice a week Her updated medication list for this problem includes:    Estrace 0.1 Mg/gm Crea (Estradiol) ..... Insert vaginally twice a week  Complete Medication List: 1)  B-complex/b-12 Tabs (B complex vitamins) .... Take 1 tablet by mouth once a day 2)  Amitriptyline Hcl 25 Mg Tabs (Amitriptyline hcl) .... Take 1 tablet by mouth at bedtime as needed 3)  Bl  Maximum One Daily Tabs (Multiple vitamins-minerals) .... Take 1 tablet by mouth once a day 4)  Fluconazole 150 Mg Tabs (Fluconazole) .... Take 1 tablet by mouth each week as needed 5)  Flonase 50 Mcg/act Susp (Fluticasone propionate) .... 2 sprays in each nostril once daily as needed 6)  Re Chlordiazepoxide/clidinium 5-2.5 Mg Caps (Clidinium-chlordiazepoxide) .... Take one by mouth every 4 hours as needed 7)  Alphagan P 0.1 % Soln (Brimonidine tartrate) .... Use two times a day 8)  Vitamin D3 5000 Unit/ml Liqd (Cholecalciferol) .... Take one daily 9)  Calcium 1000mg  and Magnesium and Vitamin D  .... Take three daily 10)  Fish Oil Oil (Fish oil) .... Take two daily 11)  Zegerid Otc 20-1100 Mg Caps (Omeprazole-sodium bicarbonate) .... Otc as directed. 12)  Estrace 0.1 Mg/gm Crea (Estradiol) .... Insert vaginally twice a week 13)  Tylenol Rapid Release  .... Otc as directed. 14)  Nyquil D Cold/flu 60-12.01-16-999 Mg/11ml Liqd (Pseudoeph-doxylamine-dm-apap) .... Otc as directed. 15)  Augmentin 875-125 Mg Tabs (Amoxicillin-pot clavulanate) .Marland Kitchen.. 1 by mouth two times  a day for 10 days for sinus infection  Patient Instructions: 1)  take the augmentin as directed  2)  drink lots of water and try nasal saline spray for sinus infection 3)  update me if not improving next week  4)  no change in medicines  Prescriptions: AUGMENTIN 875-125 MG TABS (AMOXICILLIN-POT CLAVULANATE) 1 by mouth two times a day for 10 days for sinus infection  #20 x 0   Entered and Authorized by:   Judith Part MD   Signed by:   Judith Part MD on 01/06/2010   Method used:   [facility] to        [id number]       [address]       [place], [place]  [address]       Ph: [phone]       Fax: [phone]   RxID:   [id number]   Current Allergies (reviewed today): ! CODEINE ! CELEBREX ! ULTRAM ! MORPHINE ! DARVOCET ! IBUPROFEN

## 2010-09-19 NOTE — Progress Notes (Signed)
Summary: still has sore throat  Phone Note Call from Patient Call back at Work Phone [phone]   Caller: Patient Summary of Call: Pt was seen on 8/5 for a sore throat.   She was given augmentin and was told to call if problems.  She finished this last night and still has left side sore throat, drainage.  [facility] [address]. Initial call taken by: Lowella Petties CMA,  April 03, 2010 9:31 AM  Follow-up for Phone Call        did she improve and then worsen again?  facial / sinus pain or fever? thanks - please update me  Follow-up by: Judith Part MD,  April 03, 2010 11:05 AM  Additional Follow-up for Phone Call Additional follow up Details #1::        Patient stated that the sore throat has been coming and going and never completely went away, has facial pressure when bending over, headache, some blood when blowing nose, but no fever. Patient stated that the fluid is the ears has improved. Sydell Axon LPN  April 03, 2010 11:21 AM lets continue the augmentin for 5 more days - then update me -- if not improved may consider sinus CT or specialist ref px written on EMR for call in  Additional Follow-up by: Judith Part MD,  April 03, 2010 11:25 AM    New/Updated Medications: AUGMENTIN 875-125 MG TABS (AMOXICILLIN-POT CLAVULANATE) 1 by mouth two times a day for 5 more days Prescriptions: AUGMENTIN 875-125 MG TABS (AMOXICILLIN-POT CLAVULANATE) 1 by mouth two times a day for 5 more days  #10 x 0   Entered by:   Delilah Shan CMA (AAMA)   Authorized by:   Judith Part MD   Signed by:   Delilah Shan CMA (AAMA) on 04/03/2010   Method used:   [facility]        [id number]       [address]       [place], [place]  [address]       Ph: [phone]       Fax: [phone]   RxID:   [id number]   Appended Document: still has sore throat Patient Advised.

## 2010-09-19 NOTE — Progress Notes (Signed)
Summary: regarding allergy sxs  Phone Note Call from Patient Call back at Work Phone [phone]   Caller: Patient Summary of Call: Pt has had a headache for 3 days, has a lot of sinus drainage.  She has been taking zyrtec at night but this isnt helping.  What other allergy meds can she try that will help with the drainage?  [facility] [address]. Initial call taken by: Lowella Petties CMA,  Dec 29, 2009 12:05 PM  Follow-up for Phone Call        claritin or allegra -- both over the counter now  reasonable to try flonase nasal spray too - looks like on med list  OK to try short term use of sudafed as well Follow-up by: Hannah Beat MD,  Dec 29, 2009 12:43 PM  Additional Follow-up for Phone Call Additional follow up Details #1::        Patient advised.Consuello Masse CMA  Additional Follow-up by: Benny Lennert CMA Duncan Dull),  Dec 29, 2009 2:48 PM

## 2010-09-19 NOTE — Letter (Signed)
Summary: Out of Work  [facility] [employer] [facility]  [address] [place], [place] [address]   Phone: [phone]  Fax: [phone]    April 10, 2010   Employee:  [name]    To Whom It May Concern:   For Medical reasons, please excuse the above named employee from work for the following dates:  Start:   04/10/2010  End:   can return 04/12/2010 if she is feeling better  If you need additional information, please feel free to contact our office.         Sincerely,    Judith Part MD

## 2010-09-19 NOTE — Assessment & Plan Note (Signed)
Summary: NOSE TO EAR PAIN,SWELLING/CLE   Vital Signs:  Patient profile:   50 year old female Height:      64 inches Weight:      155.50 pounds BMI:     26.79 Temp:     97.9 degrees F oral Pulse rate:   76 / minute Pulse rhythm:   regular BP sitting:   126 / 78  (left arm) Cuff size:   regular  Vitals Entered By: Lewanda Rife LPN (April 10, 2010 12:28 PM) CC: nose to left ear pain and swelling, left side of face hurts and h/a, Abdominal Pain   History of Present Illness: was treated recently for sinusitis- with augmentin  finished 10 day course and then ext for another 5 d due to one sided throat swelling and facial pain  called today- L facial pain bad headache for past 2-3 days  taking tylenol sinus  L side of face is sore today (woke her up in middle of night) -- used ice pack hurt so bad could not open mouth well - sipped through a straw  feels swollen L face to ear and neck  had hair colored sat- some irritation on neck  finished augmentin in sat  called the eye doctor on 15th - told to stop her drops (in case the drops were making sinus problems worse)  headache is throbbing and behind L eye   loose bowels a little - little nausea from drainage- no vomiting   no dental issues - cleaning in july and no problems there (does need bridge replaced but not infected)  has night gaurd -- and not worn in a year because it does not fit   throat is no longer sore-- just drainage  [place]uel C[place] itches   has had a few migraines in the past  ? if this feels like one   Allergies: 1)  ! Codeine 2)  ! Celebrex 3)  ! Ultram 4)  ! Morphine 5)  ! Darvocet 6)  ! Ibuprofen  Past History:  Past Medical History: Last updated: 07/12/2009 back pain - with epidural injections gastritis rhinitis    ortho-- Farris Has  GI [id number]  Past Surgical History: Last updated: 12/21/2009 2006 [facility] EGD- gastritis 2006 colonoscopy EGD gastritis foot fx 2010  Family History: Last updated:  12/21/2009 father disc dz  mother  father's side - arthitis , disc dz brother disk dz   Social History: Last updated: 09/16/2008 non smoker  has children  Review of Systems General:  Complains of fatigue; denies chills, fever, loss of appetite, and malaise. Eyes:  Denies blurring and eye pain. ENT:  Complains of earache, nasal congestion, postnasal drainage, and sinus pressure; denies sore throat. CV:  Denies chest pain or discomfort and lightheadness. Resp:  Denies cough, pleuritic, and wheezing. GI:  Denies abdominal pain, change in bowel habits, indigestion, and nausea. Derm:  Denies itching, lesion(s), poor wound healing, and rash. Neuro:  Denies numbness and tingling. Endo:  Denies cold intolerance, excessive thirst, excessive urination, and heat intolerance. Heme:  Denies abnormal bruising and bleeding.  Physical Exam  General:  Well-developed,well-nourished,in no acute distress; alert,appropriate and cooperative throughout examination Head:  tender over L maxillary and ethmoid sinuses tender over temple tender over TMJ with no crepitice  Eyes:  vision grossly intact, pupils equal, pupils round, pupils reactive to light, and no injection.   Ears:  R ear normal and L ear normal.   Nose:  nares are mildly congested  Mouth:  pharynx pink  and moist, no erythema, and no exudates.   Neck:  supple with full rom and no masses or thyromegally, no JVD or carotid bruit  Chest Wall:  No deformities, masses, or tenderness noted. Lungs:  Normal respiratory effort, chest expands symmetrically. Lungs are clear to auscultation, no crackles or wheezes. Heart:  Normal rate and regular rhythm. S1 and S2 normal without gallop, murmur, click, rub or other extra sounds. Msk:  No deformity or scoliosis noted of thoracic or lumbar spine.   Extremities:  No clubbing, cyanosis, edema, or deformity noted with normal full range of motion of all joints.   Neurologic:  cranial nerves II-XII intact,  strength normal in all extremities, sensation intact to light touch, gait normal, DTRs symmetrical and normal, and toes down bilaterally on Babinski.   Skin:  Intact without suspicious lesions or rashes Cervical Nodes:  No lymphadenopathy noted Psych:  normal affect, talkative and pleasant  pt is uncomfortable, however    Impression & Recommendations:  Problem # 1:  HEADACHE (ICD-784.0) see below  diff incl sinusitis/ tmj/ less likely TA  lab today ibuprofen as needed for pain and inflammation- as tolerated update if worse Her updated medication list for this problem includes:    Ibuprofen 800 Mg Tabs (Ibuprofen) .Marland Kitchen... 1 by mouth up to three times a day as needed pain with food  Orders: Venipuncture ([address]) TLB-CBC Platelet - w/Differential (85025-CBCD) TLB-Sedimentation Rate (ESR) (85652-ESR) Radiology Referral (Radiology) Prescription Created Electronically [phone])  Problem # 2:  SINUSITIS - ACUTE-NOS (ICD-461.9) pt still having facial painand green nasal d/c after 15 days of augmentin  sent for limited sinus CT  The following medications were removed from the medication list:    Augmentin 875-125 Mg Tabs (Amoxicillin-pot clavulanate) .Marland Kitchen... 1 by mouth two times a day for 5 more days Her updated medication list for this problem includes:    Flonase 50 Mcg/act Susp (Fluticasone propionate) .Marland Kitchen... 2 sprays in each nostril once daily as needed  Orders: Venipuncture ([address]) TLB-CBC Platelet - w/Differential (85025-CBCD) TLB-Sedimentation Rate (ESR) (91478-GNF) Radiology Referral (Radiology) Prescription Created Electronically [id number])  Complete Medication List: 1)  B-complex/b-12 Tabs (B complex vitamins) .... Take 1 tablet by mouth once a day 2)  Amitriptyline Hcl 25 Mg Tabs (Amitriptyline hcl) .... Take 1 tablet by mouth at bedtime as needed 3)  Bl Maximum One Daily Tabs (Multiple vitamins-minerals) .... Take 1 tablet by mouth once a day 4)  Fluconazole 150 Mg Tabs  (Fluconazole) .... Take 1 tablet by mouth each week as needed 5)  Flonase 50 Mcg/act Susp (Fluticasone propionate) .... 2 sprays in each nostril once daily as needed 6)  Re Chlordiazepoxide/clidinium 5-2.5 Mg Caps (Clidinium-chlordiazepoxide) .... Take one by mouth every 4 hours as needed 7)  Alphagan P 0.1 % Soln (Brimonidine tartrate) .... Use two times a day 8)  Vitamin D3 5000 Unit/ml Liqd (Cholecalciferol) .... Take one daily 9)  Calcium 1000mg  and Magnesium and Vitamin D  .... Take three daily 10)  Fish Oil Oil (Fish oil) .... Take one daily 11)  Zegerid Otc 20-1100 Mg Caps (Omeprazole-sodium bicarbonate) .... Otc as directed. 12)  Estrace 0.1 Mg/gm Crea (Estradiol) .... Insert vaginally twice a week 13)  Tylenol Rapid Release  .... Otc as directed. 14)  Ibuprofen 800 Mg Tabs (Ibuprofen) .Marland Kitchen.. 1 by mouth up to three times a day as needed pain with food   Patient Instructions: 1)  labs today 2)  we will set up sinus CT at check out  3)  take ibuprofen with food up to three times a day for pain and inflammation  4)  use warm or cold compress on your face 5)  no gum or chewy substances -- use straw to drink  6)  update me if worse  Prescriptions: IBUPROFEN 800 MG TABS (IBUPROFEN) 1 by mouth up to three times a day as needed pain with food  #60 x 1   Entered and Authorized by:   Judith Part MD   Signed by:   Judith Part MD on 04/10/2010   Method used:   [facility] to        [id number]       [address]       [place], [place]  [address]       Ph: [phone]       Fax: [phone]   RxID:   [id number]   Current Allergies (reviewed today): ! CODEINE ! CELEBREX ! ULTRAM ! MORPHINE ! DARVOCET ! IBUPROFEN

## 2010-09-19 NOTE — Progress Notes (Signed)
Summary: face is hurting, has appt today  Phone Note Call from Patient   Caller: Patient Summary of Call: Pt called to report that she has had headaches, left side of face is hurting and swollen- tender to touch.  Pain with opening her mouth. Finished abx on saturday.  She has appt to see you today, just wanted you to be aware of what was going on. Initial call taken by: Lowella Petties CMA,  April 10, 2010 8:25 AM  Follow-up for Phone Call        thanks for the update- will see her later today if she thinks she needs to go to ER before then-- if severe swelling or high fever-- please do so Follow-up by: Judith Part MD,  April 10, 2010 8:46 AM  Additional Follow-up for Phone Call Additional follow up Details #1::        Patient notified as instructed by telephone. Was informed by patient that she does not feel that she needs to go to the ER. Patient states that she does not have a fever and the pain has eased up some since overnight. Patient stated that she will keep appointment as scheduled. Additional Follow-up by: Sydell Axon LPN,  April 10, 2010 10:01 AM

## 2010-09-19 NOTE — Assessment & Plan Note (Signed)
Summary: joint pain, muscle pain/alc   Vital Signs:  Patient profile:   50 year old female Height:      64 inches Weight:      157.50 pounds BMI:     27.13 Temp:     97.5 degrees F oral Pulse rate:   80 / minute Pulse rhythm:   regular BP sitting:   126 / 82  (left arm) Cuff size:   regular  Vitals Entered By: Lewanda Rife LPN (Dec 22, 4006 3:58 PM) CC: Fingers, wrist and hip joints hurt, muscle pain in legs and legs swollen. Face appears puffy and feels tired.   History of Present Illness: is having a lot of joint pain  now is having feeling of weakness and pain in muscles and legs  now pain in joints in her wrists  ? if menopausal   is wondering about thyroid   also swelling in ankles   some swelling in joints in fingers  had to have her rings re- sized   broke her foot in feb was less active and in boot 2 mo   is in menopause taking estroven  vaginal dryness- uses estrace cream is hot all night cannot sleep well    face is puffy   feels tired  no fevers  no rash  had a tick bite sunday- got it off immediately - and no rash from that    had back and hip injections in 2009- helped some   has to be careful with nsaids - aleve 1 two times a day as long as she is taking zegerid  has had egd before  takes tylenol - it occ helps      Allergies: 1)  ! Codeine 2)  ! Celebrex 3)  ! Ultram 4)  ! Morphine 5)  ! Darvocet 6)  ! Ibuprofen  Past History:  Past Medical History: Last updated: 07/12/2009 back pain - with epidural injections gastritis rhinitis    ortho-- Farris Has  GI [id number]  Family History: Last updated: 12/21/2009 father disc dz  mother  father's side - arthitis , disc dz brother disk dz   Social History: Last updated: 09/16/2008 non smoker  has children  Past Surgical History: 2006 [facility] EGD- gastritis 2006 colonoscopy EGD gastritis foot fx 2010  Family History: father disc dz  mother  father's side - arthitis , disc  dz brother disk dz   Review of Systems General:  Complains of fatigue; denies chills, fever, loss of appetite, and malaise. Eyes:  Denies blurring and eye irritation. CV:  Denies chest pain or discomfort, palpitations, shortness of breath with exertion, and swelling of feet. Resp:  Denies cough and wheezing. GI:  Denies abdominal pain, bloody stools, change in bowel habits, and nausea. GU:  Denies abnormal vaginal bleeding, discharge, and dysuria. MS:  Complains of joint pain, joint swelling, muscle aches, and stiffness; denies joint redness, cramps, and muscle weakness. Derm:  Denies itching, lesion(s), poor wound healing, and rash. Neuro:  Denies headaches, numbness, and tingling. Psych:  mood is fair . Endo:  Denies cold intolerance, excessive thirst, excessive urination, and heat intolerance. Heme:  Denies abnormal bruising and bleeding.  Physical Exam  General:  Well-developed,well-nourished,in no acute distress; alert,appropriate and cooperative throughout examination Head:  normocephalic, atraumatic, and no abnormalities observed.   Eyes:  vision grossly intact, pupils equal, pupils round, and pupils reactive to light.  no conjunctival pallor, injection or icterus  Mouth:  pharynx pink and moist.   Neck:  supple with full rom and no masses or thyromegally, no JVD or carotid bruit  Chest Wall:  No deformities, masses, or tenderness noted. Lungs:  Normal respiratory effort, chest expands symmetrically. Lungs are clear to auscultation, no crackles or wheezes. Heart:  Normal rate and regular rhythm. S1 and S2 normal without gallop, murmur, click, rub or other extra sounds. Abdomen:  Bowel sounds positive,abdomen soft and non-tender without masses, organomegaly or hernias noted. no renal bruits  Msk:  mildly tender mcp and mtp joints  tender quad muscles full rom all joints no defomity or crepitice  nl gait - still with healing foot fx   Pulses:  R and L  carotid,radial,femoral,dorsalis pedis and posterior tibial pulses are full and equal bilaterally Extremities:  trace edema in ankles  Neurologic:  sensation intact to light touch, gait normal, and DTRs symmetrical and normal.   Skin:  Intact without suspicious lesions or rashes Cervical Nodes:  No lymphadenopathy noted Inguinal Nodes:  No significant adenopathy Psych:  normal affect, talkative and pleasant    Impression & Recommendations:  Problem # 1:  ARTHRALGIA (ICD-719.40) Assessment New with no deformity and no crepitice also some muscle soreness and subjective weakness ? if this is menopausal  check lab and f/u 2 wk to disc further will use caution with nsaids due to edema and stomach Orders: Venipuncture ([address]) TLB-CBC Platelet - w/Differential (85025-CBCD) TLB-Hepatic/Liver Function Pnl (80076-HEPATIC) TLB-BMP (Basic Metabolic Panel-BMET) (80048-METABOL) TLB-TSH (Thyroid Stimulating Hormone) (84443-TSH) TLB-T4 (Thyrox), Free [id number] TLB-Rheumatoid Factor (RA) (19147-WG) TLB-Sedimentation Rate (ESR) (85652-ESR) T-Antinuclear Antib (ANA) (95621-30865) T-Fibrinogen (78469-62952)  Problem # 2:  FATIGUE (ICD-780.79) suspect menopausal with wt gain and poor sleep lab today- thyroid may play a role disc at 2 wk f/u Orders: Venipuncture (84132) TLB-CBC Platelet - w/Differential (85025-CBCD) TLB-Hepatic/Liver Function Pnl (80076-HEPATIC) TLB-BMP (Basic Metabolic Panel-BMET) (80048-METABOL) TLB-TSH (Thyroid Stimulating Hormone) (84443-TSH) TLB-T4 (Thyrox), Free [phone]) TLB-Rheumatoid Factor (RA) (36644-IH) TLB-Sedimentation Rate (ESR) (85652-ESR) T-Antinuclear Antib (ANA) (47425-95638) T-Fibrinogen (75643-32951)  Problem # 3:  EDEMA (ICD-782.3) Assessment: New ankles and fingers  corresp with wt gain andmenop lab today  disc at f/u disc elevation/ salt restricion and good water intake  Complete Medication List: 1)  B-complex/b-12 Tabs (B complex  vitamins) .... Take 1 tablet by mouth once a day 2)  Amitriptyline Hcl 25 Mg Tabs (Amitriptyline hcl) .... Take 1 tablet by mouth at bedtime as needed 3)  Bl Maximum One Daily Tabs (Multiple vitamins-minerals) .... Take 1 tablet by mouth once a day 4)  Fluconazole 150 Mg Tabs (Fluconazole) .... Take 1 tablet by mouth each week as needed 5)  Flonase 50 Mcg/act Susp (Fluticasone propionate) .... 2 sprays in each nostril once daily as needed 6)  Re Chlordiazepoxide/clidinium 5-2.5 Mg Caps (Clidinium-chlordiazepoxide) .... Take one by mouth every 4 hours as needed 7)  Alphagan P 0.1 % Soln (Brimonidine tartrate) .... Use two times a day 8)  Vitamin D3 5000 Unit/ml Liqd (Cholecalciferol) .... Take one daily 9)  Calcium 1000mg  and Magnesium and Vitamin D  .... Take three daily 10)  Fish Oil Oil (Fish oil) .... Take two daily 11)  Zegerid Otc 20-1100 Mg Caps (Omeprazole-sodium bicarbonate) .... Otc as directed. 12)  Aleve 220 Mg Tabs (Naproxen sodium) .... Take one tab twice a day 13)  Estrace 1 Mg Tabs (Estradiol) .... Take twice a week  Patient Instructions: 1)  doing labs today for fatigue and joint pain (including thyroid tests )  2)  follow up with me in about 2 weeks  3)  use aleve minimally  4)  drink lots of water 5)  avoid salt  6)  low impact exercise when cleared to do so   Current Allergies (reviewed today): ! CODEINE ! CELEBREX ! ULTRAM ! MORPHINE ! DARVOCET ! IBUPROFEN

## 2010-09-19 NOTE — Letter (Signed)
Summary: Colonoscopy Letter  [facility]  [address] [place], [place] [address]   Phone: [phone]  Fax: [phone]      November 29, 2009 MRN: [id number]   [facility] [address] [place], [place]  [address]   Dear Ms. Haan,   According to your medical record, it is time for you to schedule a Colonoscopy. The American Cancer Society recommends this procedure as a method to detect early colon cancer. Patients with a family history of colon cancer, or a personal history of colon polyps or inflammatory bowel disease are at increased risk.  This letter has beeen generated based on the recommendations made at the time of your procedure. If you feel that in your particular situation this may no longer apply, please contact our office.  Please call our office at [phone] to schedule this appointment or to update your records at your earliest convenience.  Thank you for cooperating with [place] to provide you with the very best care possible.   Sincerely,  Judie Petit T. Russella Dar, M.D.  [facility] [phone]

## 2010-09-19 NOTE — Assessment & Plan Note (Signed)
Summary: sinus issues/alc   Vital Signs:  Patient profile:   50 year old female Height:      64 inches Weight:      154.25 pounds BMI:     26.57 Temp:     98 degrees F oral Pulse rate:   84 / minute Pulse rhythm:   regular BP sitting:   114 / 74  (left arm) Cuff size:   regular  Vitals Entered By: Lewanda Rife LPN (March 24, 2010 10:22 AM) CC: sinus drainage, h/a, nausea, pressure in face when bends over and when turns to fast gets light headed.   History of Present Illness: has had symptoms for 2 weeks uri symptoms  ears popping and hurting now more facial pain  some dizziness if she turns quickly hurts head to bend over   mucous is now colored and thick  burns in her nose  no fever   tried day quil sinus - as needed     Allergies: 1)  ! Codeine 2)  ! Celebrex 3)  ! Ultram 4)  ! Morphine 5)  ! Darvocet 6)  ! Ibuprofen  Past History:  Past Medical History: Last updated: 07/12/2009 back pain - with epidural injections gastritis rhinitis    ortho-- Farris Has  GI [id number]  Past Surgical History: Last updated: 12/21/2009 2006 [facility] EGD- gastritis 2006 colonoscopy EGD gastritis foot fx 2010  Family History: Last updated: 12/21/2009 father disc dz  mother  father's side - arthitis , disc dz brother disk dz   Social History: Last updated: 09/16/2008 non smoker  has children  Review of Systems General:  Complains of fatigue; denies chills, fever, loss of appetite, and malaise. Eyes:  Complains of eye pain; denies discharge and eye irritation. ENT:  Complains of nasal congestion, postnasal drainage, sinus pressure, and sore throat. CV:  Denies chest pain or discomfort and palpitations. Resp:  Complains of cough; denies wheezing. GI:  Denies diarrhea, nausea, and vomiting. Derm:  Denies rash. Neuro:  Complains of headaches.  Physical Exam  General:  Well-developed,well-nourished,in no acute distress; alert,appropriate and cooperative throughout  examination Head:  normocephalic, atraumatic, and no abnormalities observed.  tender frontal and ethmoid sinuses bilat  Eyes:  vision grossly intact, pupils equal, pupils round, and pupils reactive to light.  no conjunctival pallor, injection or icterus  Ears:  TMs are dull bilaterally  Nose:  nares are congested and injected bilat  Mouth:  pharynx pink and moist, no erythema, and no exudates.   Neck:  supple with full rom and no masses or thyromegally, no JVD or carotid bruit  Lungs:  Normal respiratory effort, chest expands symmetrically. Lungs are clear to auscultation, no crackles or wheezes. Heart:  Normal rate and regular rhythm. S1 and S2 normal without gallop, murmur, click, rub or other extra sounds. Skin:  Intact without suspicious lesions or rashes Cervical Nodes:  No lymphadenopathy noted Psych:  normal affect, talkative and pleasant    Impression & Recommendations:  Problem # 1:  SINUSITIS - ACUTE-NOS (ICD-461.9) Assessment Deteriorated  with congestion and facial pain after 2wk of uri  recommend sympt care- see pt instructions   cover with augmentin as directed recommend regular saline rinses to prevent problems - may help allergies too The following medications were removed from the medication list:    Nyquil D Cold/flu 60-12.01-16-999 Mg/61ml Liqd (Pseudoeph-doxylamine-dm-apap) ..... Otc as directed.    Augmentin 875-125 Mg Tabs (Amoxicillin-pot clavulanate) .Marland Kitchen... 1 by mouth two times a day for 10 days  for sinus infection Her updated medication list for this problem includes:    Flonase 50 Mcg/act Susp (Fluticasone propionate) .Marland Kitchen... 2 sprays in each nostril once daily as needed    Augmentin 875-125 Mg Tabs (Amoxicillin-pot clavulanate) .Marland Kitchen... 1 by mouth two times a day for 10 days for sinus infection  Orders: Prescription Created Electronically [phone])  Complete Medication List: 1)  B-complex/b-12 Tabs (B complex vitamins) .... Take 1 tablet by mouth once a day 2)   Amitriptyline Hcl 25 Mg Tabs (Amitriptyline hcl) .... Take 1 tablet by mouth at bedtime as needed 3)  Bl Maximum One Daily Tabs (Multiple vitamins-minerals) .... Take 1 tablet by mouth once a day 4)  Fluconazole 150 Mg Tabs (Fluconazole) .... Take 1 tablet by mouth each week as needed 5)  Flonase 50 Mcg/act Susp (Fluticasone propionate) .... 2 sprays in each nostril once daily as needed 6)  Re Chlordiazepoxide/clidinium 5-2.5 Mg Caps (Clidinium-chlordiazepoxide) .... Take one by mouth every 4 hours as needed 7)  Alphagan P 0.1 % Soln (Brimonidine tartrate) .... Use two times a day 8)  Vitamin D3 5000 Unit/ml Liqd (Cholecalciferol) .... Take one daily 9)  Calcium 1000mg  and Magnesium and Vitamin D  .... Take three daily 10)  Fish Oil Oil (Fish oil) .... Take one daily 11)  Zegerid Otc 20-1100 Mg Caps (Omeprazole-sodium bicarbonate) .... Otc as directed. 12)  Estrace 0.1 Mg/gm Crea (Estradiol) .... Insert vaginally twice a week 13)  Tylenol Rapid Release  .... Otc as directed. 14)  Augmentin 875-125 Mg Tabs (Amoxicillin-pot clavulanate) .Marland Kitchen.. 1 by mouth two times a day for 10 days for sinus infection  Patient Instructions: 1)  try nasal saline spray or netti pot for congestion  2)  take augmentin as directed - I sent to pharmacy  3)  also mucinex may help  4)  update me if not improved in 10 days or if worse Prescriptions: AUGMENTIN 875-125 MG TABS (AMOXICILLIN-POT CLAVULANATE) 1 by mouth two times a day for 10 days for sinus infection  #20 x 0   Entered and Authorized by:   Judith Part MD   Signed by:   Judith Part MD on 03/24/2010   Method used:   Electronically to        [facility]  [address] (retail)       [address]       [place], [place]  [address]       Ph: [phone] or [phone]       Fax: [phone]   RxID:   [id number]   Current Allergies (reviewed today): ! CODEINE ! CELEBREX ! ULTRAM ! MORPHINE ! DARVOCET ! IBUPROFEN

## 2010-09-19 NOTE — Progress Notes (Signed)
Summary: called CT report  Phone Note From Other Clinic   Caller: [place] [facility] Summary of Call: Called report- no evidence of sinusitis.  Report is in pt's chart. Initial call taken by: Lowella Petties CMA,  April 10, 2010 4:13 PM  Follow-up for Phone Call        comment is under report  Follow-up by: Judith Part MD,  April 10, 2010 8:31 PM

## 2010-09-21 NOTE — Assessment & Plan Note (Signed)
Summary: F/U AFTER LABS/RI   Vital Signs:  Patient profile:   50 year old female Weight:      163.25 pounds BMI:     28.12 Temp:     98.2 degrees F oral Pulse rate:   84 / minute Pulse rhythm:   regular BP sitting:   122 / 78  (left arm) Cuff size:   regular  Vitals Entered By: Selena Batten Dance CMA Duncan Dull) (September 08, 2010 2:07 PM) CC: Follow up on labs   History of Present Illness: pt called to say her liver enzymes were up on ins labs we repeated hepatic labs along with hepatis studies- all nl   she does not have copy of labs yet - just the letter from the ins co  is a life insurance renewal    wt is up 8 lb with bmi of 28  bp good   tylenol -- is off and on and then goes 3-4 days witout anything for headache  occ ibuprofen  when she takes tylenol -- does not take much -- usually one dose a day maximum    alcohol- no alcohol at all   has also had ccy   chronic congestion - worse at night  using nasal saline  no better -- months -- does not get better  CT was normal in past     Allergies: 1)  ! Codeine 2)  ! Celebrex 3)  ! Ultram 4)  ! Morphine 5)  ! Darvocet 6)  ! Ibuprofen  Past History:  Past Medical History: Last updated: 07/12/2009 back pain - with epidural injections gastritis rhinitis    ortho-- Farris Has  GI [id number]  Past Surgical History: Last updated: 12/21/2009 2006 [facility] EGD- gastritis 2006 colonoscopy EGD gastritis foot fx 2010  Family History: Last updated: 12/21/2009 father disc dz  mother  father's side - arthitis , disc dz brother disk dz   Social History: Last updated: 09/16/2008 non smoker  has children  Review of Systems General:  Denies chills, fatigue, fever, loss of appetite, malaise, and weight loss. Eyes:  Denies blurring and eye irritation. ENT:  Complains of nasal congestion, postnasal drainage, and sinus pressure; denies sore throat. CV:  Denies chest pain or discomfort and palpitations. Resp:  Denies cough,  shortness of breath, and wheezing. GI:  Denies abdominal pain, change in bowel habits, diarrhea, indigestion, loss of appetite, nausea, vomiting, and yellowish skin color. GU:  Denies urinary frequency. MS:  Denies muscle aches. Derm:  Denies itching, lesion(s), and rash. Neuro:  Denies numbness and tingling. Endo:  Denies cold intolerance and heat intolerance. Heme:  Denies abnormal bruising and bleeding.  Physical Exam  General:  overweight but generally well appearing  Head:  normocephalic, atraumatic, and no abnormalities observed.   Eyes:  vision grossly intact, pupils equal, pupils round, and pupils reactive to light.  no conjunctival pallor, injection or icterus  Ears:  R ear normal and L ear normal.   Nose:  nares are boggy/ congested Mouth:  pharynx pink and moist.   Neck:  supple with full rom and no masses or thyromegally, no JVD or carotid bruit  Lungs:  Normal respiratory effort, chest expands symmetrically. Lungs are clear to auscultation, no crackles or wheezes. Heart:  Normal rate and regular rhythm. S1 and S2 normal without gallop, murmur, click, rub or other extra sounds. Abdomen:  Bowel sounds positive,abdomen soft and non-tender without masses, organomegaly or hernias noted. no renal bruits  Skin:  no jaundice or pallor  Cervical Nodes:  No lymphadenopathy noted Psych:  normal affect, talkative and pleasant    Impression & Recommendations:  Problem # 1:  NONSPEC ELEVATION OF LEVELS OF TRANSAMINASE/LDH (ICD-790.4) Assessment Improved this was in some insurance labwork- and pt still does not have a copy  will send one when she gets her hepatic panel fine here and also neg hepatitis prof no alcohol occ tylenol--? if that was the source  mildly overwt and had ccy  Problem # 2:  HEADACHE (ICD-784.0) Assessment: Deteriorated ongoing sinus headache and facial pressure / with severe congestion despite nasal sprays  neg Ct in past  will ref to ENT  Her updated  medication list for this problem includes:    Ibuprofen 800 Mg Tabs (Ibuprofen) .Marland Kitchen... 1 by mouth up to three times a day as needed pain with food  Orders: ENT Referral (ENT)  Complete Medication List: 1)  B-complex/b-12 Tabs (B complex vitamins) .... Take 1 tablet by mouth once a day 2)  Amitriptyline Hcl 25 Mg Tabs (Amitriptyline hcl) .... Take 1 tablet by mouth at bedtime as needed 3)  Bl Maximum One Daily Tabs (Multiple vitamins-minerals) .... Take 1 tablet by mouth once a day 4)  Fluconazole 150 Mg Tabs (Fluconazole) .... Take 1 tablet by mouth each week as needed 5)  Re Chlordiazepoxide/clidinium 5-2.5 Mg Caps (Clidinium-chlordiazepoxide) .... Take one by mouth every 4 hours as needed 6)  Vitamin D3 5000 Unit/ml Liqd (Cholecalciferol) .... Take one daily 7)  Calcium 1000mg  and Magnesium and Vitamin D  .... Take three daily 8)  Fish Oil Oil (Fish oil) .... Take one daily 9)  Zegerid Otc 20-1100 Mg Caps (Omeprazole-sodium bicarbonate) .... Otc as directed. 10)  Estrace 0.1 Mg/gm Crea (Estradiol) .... Insert vaginally twice a week 11)  Tylenol Rapid Release  .... Otc as directed. 12)  Ibuprofen 800 Mg Tabs (Ibuprofen) .Marland Kitchen.. 1 by mouth up to three times a day as needed pain with food 13)  Latanoprost 0.005 % Soln (Latanoprost) .Marland Kitchen.. 1 drop in each eye at bedtime  Patient Instructions: 1)  we will refer you to ENT for chronic sinus congestion and headache  2)  send me your insurance labs when you get a chance    Orders Added: 1)  ENT Referral [ENT] 2)  Est. Patient Level IV [[address]]    Current Allergies (reviewed today): ! CODEINE ! CELEBREX ! ULTRAM ! MORPHINE ! DARVOCET ! IBUPROFEN

## 2010-09-21 NOTE — Progress Notes (Signed)
Summary: enzymes were elevated   Phone Note Call from Patient Call back at Work Phone [phone]   Caller: Patient Call For: Kerby Nora MD Summary of Call: Patient had to renew her life insurance about a month ago and a nurse came to her home to do labs. It came back that her enzymes were elevated. Patient is asking if she should be concerned in any way and if she should have it rechecked. Please advise.  Initial call taken by: Melody Comas,  August 22, 2010 1:33 PM  Follow-up for Phone Call        What enzymes?  liver? cardiac? Get copy of labs please.    Spoke with patient and she said that the letter said her liver enzymes were elevated. She doesnt have a copy of the labs just wants to know if you think anything should be done at time.Consuello Masse CMA   Follow-up by: Kerby Nora MD,  August 22, 2010 2:05 PM  Additional Follow-up for Phone Call Additional follow up Details #1::        schedule labs for hepatic function and also hepatitis profile (acute) for 790.4 and then f/u Additional Follow-up by: Judith Part MD,  August 22, 2010 2:46 PM    Additional Follow-up for Phone Call Additional follow up Details #2::    Left message for patient to call back. Lewanda Rife LPN  August 22, 2010 4:41 PM   Patient notified as instructed by telephone. lab appointment scheduled as instructed 08/25/10 at 3:30 and f/u appt with Dr Milinda Antis on 09/08/10 at 2pm.Rena [facility] LPN  August 23, 2010 8:58 AM

## 2010-09-27 NOTE — Letter (Signed)
Summary: [facility]  [facility]   Imported By: Kassie Mends 09/22/2010 10:51:53  _____________________________________________________________________  External Attachment:    Type:   Image     Comment:   External Document

## 2010-12-13 ENCOUNTER — Encounter: Payer: Self-pay | Admitting: Family Medicine

## 2010-12-13 ENCOUNTER — Ambulatory Visit (INDEPENDENT_AMBULATORY_CARE_PROVIDER_SITE_OTHER): Payer: BC Managed Care – PPO | Admitting: Family Medicine

## 2010-12-13 ENCOUNTER — Telehealth: Payer: Self-pay | Admitting: *Deleted

## 2010-12-13 VITALS — BP 128/90 | HR 60 | Temp 97.8°F | Ht 64.0 in | Wt 159.8 lb

## 2010-12-13 DIAGNOSIS — R21 Rash and other nonspecific skin eruption: Secondary | ICD-10-CM | POA: Insufficient documentation

## 2010-12-13 MED ORDER — TRIAMCINOLONE ACETONIDE 0.1 % EX CREA: TOPICAL_CREAM | Freq: Two times a day (BID) | CUTANEOUS | Status: DC

## 2010-12-13 NOTE — Assessment & Plan Note (Signed)
More consistent with irritant dermatitis, ?due to ROC cream with retinoids. Treat with steroid cream ,return if worsening or not improving as expected. Doubt shingles, but advised if any blisters to call us.

## 2010-12-13 NOTE — Telephone Encounter (Signed)
Pt was given triamcinolone at office visit today.  She needs this for her face, but she said the instructions read not to use on the face, armpits or groin.  Advised pt's husband ok for her to use on her face, dont get in eyes, nose or mouth.

## 2010-12-13 NOTE — Patient Instructions (Signed)
I think this is an irritant reaction to something, maybe the ROC cream (retinoid). Stop that cream, start steroid onto affected area. I hope it gets better quickly. If rash changing or worsening, let us know. Irritant Dermatitis Skin inflammation due to exposure to a substance that causes a physical or chemical irritation of the skin is called irritant dermatitis or irritant contact dermatitis.  CAUSES Most commonly this dermatitis is due to repeated exposure to mildly irritating substances such as ingredients in:  Make-up.   Soaps.   Detergents.   Bleaches.   Acids.   Metal salts such as nickel.  SYMPTOMS Pain and burning are usually caused by the irritants, and less commonly mild to severe itching. Repeated exposure to the substance leads to skin that is thickened, cracked, and dry. Treatment includes protecting the skin from further contact with the irritating substance through avoidance of that substance if possible. Barrier creams, powders, and gloves may be helpful. Dry, cracked skin should be treated with ointments (salves) or creams.  More harsh chemicals can cause skin damage that is like a burn with only a brief period of contact, for example alkalis or acids. You should flush your skin for 15-20 minutes with cold water after such an exposure. Chemical burns may not appear to be bad at first, but they can progress over several days to cause severe skin damage. Immediate medical care should be sought after exposure. Special dressings, antibiotics, and pain medicine may be needed for severely irritated skin. TREATMENT Hydrocortisone and prescription cortisone creams applied four times daily to the affected area will help speed healing; cortisone pills or injections may be needed in some cases. See your doctor for follow-up care to make sure your skin is healing properly. Document Released: 09/13/2004 Document Re-Released: 12/23/2008 [facility] Patient Information 2011 [place],  [place].

## 2010-12-13 NOTE — Progress Notes (Signed)
  Subjective:    Patient ID: Grace [name], female    DOB: 06-Oct-1960, 50 y.o.   MRN: [id number]  HPI CC: rash  Yesterday morning woke up with rash on L cheek.  No itching.  No burning/tingling prior to rash.  + burning when washing face or using cream.  Had shrimp on Tuesday night.  Using ROC OTC face cream for women for last 3-4 months (daily moisturizer and night time cream) may have small amt retinoid.    Takes benadryl at night to help with sinus/allergies.  No new medicines, no new lotions, detergents, soaps, shampoos.  No recent exposure outside.  Never had rash like this before.  Review of Systems Per HPI    Objective:   Physical Exam  Vitals reviewed. Constitutional: She appears well-developed and well-nourished. No distress.  HENT:  Head: Normocephalic and atraumatic.    Mouth/Throat: Oropharynx is clear and moist. No oropharyngeal exudate.       Macular faint papular rash slightly raised predominantly left cheek, not pruritic.  Slight rough skin overlying rash.  Nonvesicular.  tender to touch.  Some erythema  Eyes: Conjunctivae and EOM are normal. Pupils are equal, round, and reactive to light. No scleral icterus.  Neck: Normal range of motion. Neck supple.  Lymphadenopathy:    She has no cervical adenopathy.  Skin: Skin is warm and dry. Rash noted.          Assessment & Plan:

## 2010-12-14 NOTE — Telephone Encounter (Signed)
Agree. Ok to use on face.  Short term therapy, 0.1% cream.

## 2010-12-14 NOTE — Telephone Encounter (Signed)
Agree.  Short term therapy (2 wks).  Thanks.

## 2011-01-05 NOTE — Op Note (Signed)
Grace [name], Grace [name]                ACCOUNT NO.:  [id number]   MEDICAL RECORD NO.:  [id number]          PATIENT TYPE:  EMS   LOCATION:  MAJO                         FACILITY:  [facility]   PHYSICIAN:  Sandria Bales. Ezzard Standing, M.D.  DATE OF BIRTH:  [birth year]   DATE OF PROCEDURE:  07/10/2005  DATE OF DISCHARGE:                                 OPERATIVE REPORT   PREOPERATIVE DIAGNOSIS:  Cholecystitis, cholelithiasis.   POSTOPERATIVE DIAGNOSIS:  1.  Cholecystitis with cholelithiasis.  2.  The gallbladder was under the left lobe of the liver so it was a little      bit of an ectopic gallbladder.   OPERATION/PROCEDURE:  Laparoscopic cholecystectomy with intraoperative  cholangiogram.   SURGEON:  Sandria Bales. Ezzard Standing, M.D.   FIRST ASSISTANT:  None.   ANESTHESIA:  General endotracheal anesthesia.   ESTIMATED BLOOD LOSS:  Minimal.   INDICATIONS:  Grace [name] is a 50 year old white female who has been seen by  Dr. Ritta Slot for vague and intermittent right upper quadrant epigastric  abdominal pain.  She had upper endoscopy which showed some gastritis and she  had a colonoscopy yesterday, but then had the onset of epigastric and right  upper quadrant pain and presented to the emergency room.  Ultrasound has  shown an edematous gallbladder wall with gallstones consistent with acute  cholecystitis.  The patient now comes for attempted laparoscopic  cholecystectomy.  The indications and potential complications were explained  to the patient.  The potential complications include but are not limited to  bleeding, infection, bile duct injury, and the necessity of open surgery.   DESCRIPTION OF PROCEDURE:  The patient had a prior umbilical incision.  Therefore, I went through an infraumbilical incision transversly to get to  the abdominal cavity.  I placed a 10 mm laparoscope through a 12 mm  Hasson  trocar and secured this with a 0 Vicryl suture,  i then placed a 10 mm  trocar in the epigastric area, a 5  mm right mid subcostal and a 5 mm lateral  subcostal.  Because of the history of the colonoscopy yesterday, the day of  her acute pain, I did try to examined her abdomen. I could see omentum of  the transverse colon.  I could see some of her sigmoid colon, but I saw no  evidence of any colonic injury or free fluid that would be unusual.  The  right and left lobes of the liver were unremarkable.  The stomach was  unremarkable.  Interestingly, the gallbladder hung actually under the medial  lobe of the left lobe of the liver, almost but not directly under the  falciform ligament.  The rest of her anatomy looked normal.   The gallbladder was grasped, rotated cephalad.  The gallbladder wall was  edematous consistent with acute cholecystitis.  She did have adhesions of  the duodenum to the gallbladder consistent with chronic cholecystitis.  I  took these adhesions down, identified a cystic duct and cystic artery.  I  put the clip on the gallbladder side of the cystic duct and shot an  intraoperative cholangiogram.   The intraoperative cholangiogram was shot using a cut-off Taut catheter  inserted through the side of the cut cystic duct and secured with an endo-  clip.  I used half-strength Hypaque solution, approximately 8 mL, and  checked that this was into the cystic duct which flowed freely into the  common bile duct into the duodenum and the hepatic radicals.  There was no  filling defect.  There was no mass within the common bile duct and this was  felt to be a normal intraoperative cholangiogram.   The Taut catheter was removed.  The cystic duct was triply endoclipped and  divided.  The cystic artery triply endoclipped and divided.  The gallbladder  was then sharply and bluntly dissected from the gallbladder bed using  primarily hook Bovie coagulation.   Prior to complete division from the gallbladder bed, I revisualized the  gallbladder bed and the triangle of Calot.  I saw no  bleeding, no bile leak.  Other than the abnormal location of the gallbladder, everything else  actually looked like typical acute cholecystitis with an edematous  gallbladder wall.  I delivered the gallbladder through the umbilicus and  sent it to pathology.  I irrigated the abdomen out with about 1 L of saline.  I closed the umbilical port with 2-0 Vicryl sutures.  I closed the skin on  each side with a 5-0 Vicryl suture, painted the wounds with tincture of  Benzoin and steri-stripped them.   The patient tolerated the procedure well and was transported to the recovery  room in good condition.  Sponge and needle counts were correct at the end of  the case.      Sandria Bales. Ezzard Standing, M.D.  Electronically Signed     DHN/MEDQ  D:  07/10/2005  T:  07/11/2005  Job:  [id number]   cc:   Griffith Citron, M.D.  Fax: [phone]

## 2011-01-05 NOTE — Op Note (Signed)
NAMEMAYLANI, [name]                          ACCOUNT NO.:  [id number]   MEDICAL RECORD NO.:  [id number]                   PATIENT TYPE:  AMB   LOCATION:  DSC                                  FACILITY:  [facility]   PHYSICIAN:  Nadara Mustard, M.D.                DATE OF BIRTH:  15-Mar-1961   DATE OF PROCEDURE:  12/30/2003  DATE OF DISCHARGE:                                 OPERATIVE REPORT   PREOPERATIVE DIAGNOSIS:  Subluxing peroneal tendons left foot.   POSTOPERATIVE DIAGNOSIS:  Subluxing peroneal tendons left foot.   PROCEDURE:  1. Synovectomy around peroneal tendons.  2. Excision fibrinous tissue within the peroneal tendons.  3. Transposition of the calcaneal fibular ligament.   SURGEON:  Nadara Mustard, M.D.   ANESTHESIA:  LMA plus popliteal block.   ESTIMATED BLOOD LOSS:  Minimal.   TOURNIQUET TIME:  Esmarch to the ankle for approximately 30 minutes.   DISPOSITION:  To PACU in stable condition.  Excised tissue sent to  pathology.   HISTORY OF PRESENT ILLNESS:  The patient is a 50 year old woman with 22-month  history of left foot pain and swelling.  She has a palpable apparent mass in  the peroneal tendons and they do sublux with ankle range of motion.  Palpation over the peroneal tendons reproduces her pain. She has swelling  within the forefoot.  MRI scan showed no bony or soft tissue abnormalities  other than some mild edema in the peroneal tendons.   ASSESSMENT:  Subluxing peroneal tendons, left foot.   PLAN:  The patient is scheduled for synovectomy around the peroneal tendons  as well as debridement of the peroneal tendons and transposition of the  calcaneal ligament.  The risks and benefits were discussed including  infection, neurovascular injury, persistent pain, need for additional  surgery.  The patient states that she understands and wishes to proceed at  this time.   DESCRIPTION OF PROCEDURE:  The patient was brought to OR room 5 after  undergoing a  popliteal block.  She then underwent general LMA anesthesia.  After adequate levels of anesthesia were obtained the patient's left lower  extremity was prepped using duraprep and draped in a sterile field.  Grace [name]  was used to cover all exposed skin.  A curvilinear incision was made on the  posterior border of the fibula. This as carried down to the re tenaculum  over the peroneal tendons; this was incised.  The peroneal tendons had a  significant amount of synovitis around the tendons and this was debrided.  There was a hard, palpable mass within the peroneus longus tendon and this  was debrided from the tendon.  There was lipoma-type mass deep to the  peroneal tendons and this was removed and sent to pathology.  The  calcaneofibular ligament was transected, the peroneal tendons were reduced  beneath the calcaneofibular ligament and the calcaneofibular ligament was  repaired using a #20 FiberWire.   The patient's ankle was placed through a range of motion and the tracking of  the peroneal tendons was stable without impingement.  The retinaculum over  the peroneal tendons was then reapproximated using the #2-0 Vicryl.  The  skin was closed using a vertical mattress with 2-0 Nylon.  The wounds were  covered with Adaptic, orthopedic sponges, sterile Webril and a Coban  dressing.  The patient was extubated and taken to PACU in stable condition.  Plan for discharge to home, nonweight bearing on the left.  Follow up in the  office in 1-2 weeks.                                               Nadara Mustard, M.D.    MVD/MEDQ  D:  12/30/2003  T:  12/31/2003  Job:  [id number]

## 2011-01-05 NOTE — H&P (Signed)
Grace [name], Grace [name]                ACCOUNT NO.:  [id number]   MEDICAL RECORD NO.:  [id number]          PATIENT TYPE:  EMS   LOCATION:  MAJO                         FACILITY:  [facility]   PHYSICIAN:  Adolph Pollack, M.D.DATE OF BIRTH:  1961/07/25   DATE OF ADMISSION:  07/10/2005  DATE OF DISCHARGE:                                HISTORY & PHYSICAL   CHIEF COMPLAINT:  Severe epigastric abdominal pain.   HISTORY OF PRESENT ILLNESS:  This is a 50 year old female who since the end  of September has been having post-prandial epigastric and right upper  quadrant-type pains.  She has had an evaluation by Dr. Asencion Partridge. Medoff.  She had an upper endoscopy that showed some gastritis, and she was put on  Aciphex; however, she continued to have some of the pain with increasing  episodes.  She had a colonoscopy yesterday and had supper and about three  hours later had severe and increasing epigastric and right upper quadrant  pain radiating to the back, associated with nausea.  No fever but some  chills.  She subsequently called Dr. Kinnie Scales who told her to present to the  emergency department.  She was to have an abdominal ultrasound today at 12  noon.   PAST MEDICAL HISTORY:  1.  Gastritis.  2.  Irritable bowel syndrome.   PAST SURGICAL HISTORY:  Partial hysterectomy for endometriosis.   ALLERGIES:  CODEINE, MORPHINE, CELEBREX.  \.   MEDICATIONS:  1.  Elavil.  2.  Fluconazole.  3.  Aciphex.  4.  NSAID's.   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  No tobacco or alcohol use.   FAMILY HISTORY:  Positive for hypertension and heart disease in her mother.   REVIEW OF SYSTEMS:  CARDIAC:  Denies hypertension or heart disease.  PULMONARY:  No pneumonia, asthma, tuberculosis exposure.  GI:  No peptic  ulcer disease.  No hepatitis.  GENITOURINARY:  No kidney stones or recurrent  urinary tract infections.  ENDOCRINE:  No thyroid disease, diabetes  or  hypercholesterolemia.  HEMATOLOGIC:  No bleeding disorder, blood clots or  transfusions.   PHYSICAL EXAMINATION:  GENERAL:  A well-developed and well-nourished female,  in no acute distress.  VITAL SIGNS:  Temperature 97.9 degrees, blood pressure 140/94, pulse 97.  SKIN:  Flushed and warm with no jaundice present.  HEENT:  Normocephalic and atraumatic.  EOMI.  NECK:  Supple without masses or obvious thyroid enlargement.  LUNGS:  Breath sounds equal and clear.  Respirations unlabored.  CARDIOVASCULAR:  Demonstrates a regular rate and rhythm.  No murmur heard.  No jugular venous distention, no lower extremity edema.  ABDOMEN:  Soft, there is a small sub-umbilical scar present.  There is right  upper quadrant tenderness and epigastric tenderness with some mild guarding.  No obvious masses or organomegaly.  Hypoactive bowel sounds noted.  MUSCULOSKELETAL:  She has good muscle tone, good range of motion.   LABORATORY DATA:  White cell count 9200, with a slight leftward shift.  Hemoglobin 14.7.  Liver function tests, amylase, lipase  within normal  limits.   An abdominal ultrasound demonstrates cholelithiasis and mild gallbladder  wall thickening, suggesting a possibility of acute cholecystitis.   IMPRESSION:  Cholelithiasis with possible evolving acute cholecystitis.  Also recent post-colonoscopy state, although I doubt there is a complication  of that, given that I would expect her to be a lot more ill.   PLAN:  Will admit to the hospital.  Will check an upright PA and lateral  chest x-ray.  Will empirically treat with antibiotics and plan for a  laparoscopic cholecystectomy.  I have discussed the procedure and the risks  with her and with her husband.  The risks include but are not limited to  bleeding, infection, wound healing problems, anesthesia, accidental damage  to the common bile duct/liver/small intestine, bowel leak and post-  cholecystectomy diarrhea and food intolerance.   They both seem to understand  and agree with the plan.      Adolph Pollack, M.D.  Electronically Signed     TJR/MEDQ  D:  07/10/2005  T:  07/10/2005  Job:  [id number]

## 2011-03-07 ENCOUNTER — Telehealth: Payer: Self-pay

## 2011-03-07 NOTE — Telephone Encounter (Signed)
Patient returned my call and states that she is having a Upper Endoscopy with Dr. Kinnie Scales tomorrow and wanted to know if this phone call was regarding that. I called patient back and left her another message stating that as of now she has switched GI care when she saw Dr. Kinnie Scales for office visits or procedures. Also informed patient that those are the same type of physicians and they both do Colonoscopies. Told her if she has not had her Colonoscopy, that she is due now and can have them both done at the same time at Dr. Jennye Boroughs office.

## 2011-07-09 ENCOUNTER — Encounter: Payer: Self-pay | Admitting: Family Medicine

## 2011-07-09 ENCOUNTER — Ambulatory Visit (INDEPENDENT_AMBULATORY_CARE_PROVIDER_SITE_OTHER): Payer: BC Managed Care – PPO | Admitting: Family Medicine

## 2011-07-09 VITALS — BP 118/76 | HR 88 | Temp 98.2°F | Ht 64.0 in | Wt 159.2 lb

## 2011-07-09 DIAGNOSIS — J020 Streptococcal pharyngitis: Secondary | ICD-10-CM | POA: Insufficient documentation

## 2011-07-09 DIAGNOSIS — J029 Acute pharyngitis, unspecified: Secondary | ICD-10-CM

## 2011-07-09 MED ORDER — AMOXICILLIN 500 MG PO CAPS: 500.0000 mg | ORAL_CAPSULE | Freq: Three times a day (TID) | ORAL | Status: AC

## 2011-07-09 NOTE — Assessment & Plan Note (Signed)
With positive rapid strep as well as sore throat and fever (I think congestion may be related to a viral cold) tx with amox 500 mg tid for 10 d- inst to finish whole course inst to update if worse or not improving in 1 week  Disc symptomatic care - see instructions on AVS

## 2011-07-09 NOTE — Patient Instructions (Addendum)
Take amoxicillin as directed  mucinex cold and sinus is ok  Get extra rest and drink lots of fluids  If not improving later in the week let me know  No work until Wednesday

## 2011-07-09 NOTE — Progress Notes (Signed)
  Subjective:    Patient ID: Grace [name], female    DOB: 1961-06-22, 50 y.o.   MRN: [id number]  HPI Here for uri symptoms with sore throat/ fever and congestion  Her rapid strep test is positive today   Husband had a cold and exp to someone with a cold   Symptoms started fri- bad sore throat Sat hurt all over/ fever/ nasal congestion- started mucinex Today - chest is feeling full and rattly- cough is not prod yet  ? How high her fever was -usually runs low  Has had the chills   No n/v/d -- just a little queasy from drainage     Review of Systems Review of Systems  Constitutional: Negative for, appetite change,  and unexpected weight change. pos for fatigue and fever ENT pos for ST and congestion  Eyes: Negative for pain and visual disturbance.  Respiratory: Negative for shortness of breath.  pos for mild cough from ST Cardiovascular: Negative for cp or palpitations    Gastrointestinal: Negative for nausea, diarrhea and constipation.  Genitourinary: Negative for urgency and frequency.  Skin: Negative for pallor or rash   Neurological: Negative for weakness, light-headedness, numbness and headaches.  Hematological: Negative for adenopathy. Does not bruise/bleed easily.  Psychiatric/Behavioral: Negative for dysphoric mood. The patient is not nervous/anxious.          Objective:   Physical Exam  Constitutional: She appears well-developed and well-nourished. No distress.       Fatigued but well appearing female  HENT:  Head: Normocephalic and atraumatic.  Right Ear: External ear normal.  Left Ear: External ear normal.  Mouth/Throat: No oropharyngeal exudate.       Nares are injected and congested   No sinus tenderness Throat - mild  Diffuse injection without swelling or exudate  Eyes: Conjunctivae and EOM are normal. Pupils are equal, round, and reactive to light. Right eye exhibits no discharge. Left eye exhibits no discharge.  Neck: Normal range of motion. Neck  supple.  Cardiovascular: Normal rate and regular rhythm.   Pulmonary/Chest: Effort normal and breath sounds normal. No respiratory distress. She has no wheezes.  Lymphadenopathy:    She has no cervical adenopathy.  Neurological: She is alert.  Skin: Skin is warm and dry. No rash noted. No erythema. No pallor.  Psychiatric: She has a normal mood and affect.          Assessment & Plan:

## 2011-10-08 ENCOUNTER — Encounter: Payer: Self-pay | Admitting: Family Medicine

## 2011-10-08 ENCOUNTER — Ambulatory Visit (INDEPENDENT_AMBULATORY_CARE_PROVIDER_SITE_OTHER): Payer: BC Managed Care – PPO | Admitting: Family Medicine

## 2011-10-08 VITALS — BP 120/78 | HR 94 | Temp 98.0°F | Ht 64.0 in | Wt 163.0 lb

## 2011-10-08 DIAGNOSIS — J019 Acute sinusitis, unspecified: Secondary | ICD-10-CM

## 2011-10-08 MED ORDER — HYDROCODONE-HOMATROPINE 5-1.5 MG/5ML PO SYRP: ORAL_SOLUTION | ORAL | Status: AC

## 2011-10-08 MED ORDER — AMOXICILLIN 500 MG PO CAPS: 1000.0000 mg | ORAL_CAPSULE | Freq: Two times a day (BID) | ORAL | Status: AC

## 2011-10-08 NOTE — Progress Notes (Signed)
  Patient Name: Grace [name] Date of Birth: 11-07-1960 Age: 51 y.o. Medical Record Number: [id number] Gender: female Date of Encounter: 10/08/2011  History of Present Illness:  Grace [name] is a 51 y.o. very pleasant female patient who presents with the following:  Was visiting her daughter and last Tuesday, got up and had a lot of drainage and tight throat with a sore throat. Went to an urgent care center, and had a negative sore throat culture. Had a strep throat in November. Has been home since Friday afternoon. Started to get a diarrhea from yesterday.   Bloody drainage from nose Not sleeping well Bad facial pain  Past Medical History, Surgical History, Social History, Family History, Problem List, Medications, and Allergies have been reviewed and updated if relevant.  Review of Systems: ROS: GEN: Acute illness details above GI: Tolerating PO intake GU: maintaining adequate hydration and urination Pulm: No SOB Interactive and getting along well at home.  Otherwise, ROS is as per the HPI.   Physical Examination: Filed Vitals:   10/08/11 0900  BP: 120/78  Pulse: 94  Temp: 98 F (36.7 C)  TempSrc: Oral  Height: 5\' 4"  (1.626 m)  Weight: 163 lb (73.936 kg)  SpO2: 99%    Body mass index is 27.98 kg/(m^2).   Gen: WDWN, NAD; alert,appropriate and cooperative throughout exam  HEENT: Normocephalic and atraumatic. Throat clear, w/o exudate, no LAD, R TM clear, L TM - good landmarks, No fluid present. rhinnorhea.  Left frontal and maxillary sinuses: Tender Right frontal and maxillary sinuses: Tender  Neck: No ant or post LAD CV: RRR, No M/G/R Pulm: Breathing comfortably in no resp distress. no w/c/r Abd: S,NT,ND,+BS Extr: no c/c/e Psych: full affect, pleasant   Assessment and Plan: 1. Acute sinus infection  amoxicillin (AMOXIL) 500 MG capsule, HYDROcodone-homatropine (HYCODAN) 5-1.5 MG/5ML syrup    Also with diarrhea -- suspect that prob from zpak last  week  Acute sinusitis: ABX as below.  Refer to the patient instructions sections for details of plan shared with patient.  Reviewed symptomatic care as well as ABX in this case.

## 2011-10-15 ENCOUNTER — Telehealth: Payer: Self-pay | Admitting: Family Medicine

## 2011-10-15 ENCOUNTER — Encounter: Payer: Self-pay | Admitting: *Deleted

## 2011-10-15 NOTE — Telephone Encounter (Signed)
Patient was seen last Monday,February 18.  Patient forgot to get a note for work at the appointment.  Patient went back to work on February 19.  Patient is requesting note be mailed to her home address.

## 2011-10-15 NOTE — Telephone Encounter (Signed)
Patient note written and mailed to her home address

## 2011-12-13 ENCOUNTER — Encounter: Payer: Self-pay | Admitting: Family Medicine

## 2011-12-13 ENCOUNTER — Encounter: Payer: Self-pay | Admitting: Gastroenterology

## 2012-02-13 ENCOUNTER — Ambulatory Visit (INDEPENDENT_AMBULATORY_CARE_PROVIDER_SITE_OTHER): Payer: BC Managed Care – PPO | Admitting: Family Medicine

## 2012-02-13 ENCOUNTER — Encounter: Payer: Self-pay | Admitting: Family Medicine

## 2012-02-13 VITALS — BP 141/87 | HR 79 | Temp 98.4°F | Wt 161.2 lb

## 2012-02-13 DIAGNOSIS — R5383 Other fatigue: Secondary | ICD-10-CM | POA: Insufficient documentation

## 2012-02-13 DIAGNOSIS — H6120 Impacted cerumen, unspecified ear: Secondary | ICD-10-CM

## 2012-02-13 DIAGNOSIS — H918X9 Other specified hearing loss, unspecified ear: Secondary | ICD-10-CM

## 2012-02-13 NOTE — Patient Instructions (Addendum)
Cerumen irrigation performed today. Good to see you today, call us with questions. Thyroid check today.  We will call you with results. Ear canal can be a bit irritated after irrigation - if continued pain call me for ear drops.  Cerumen Impaction A cerumen impaction is when the wax in your ear forms a plug. This plug usually causes reduced hearing. Sometimes it also causes an earache or dizziness. Removing a cerumen impaction can be difficult and painful. The wax sticks to the ear canal. The canal is sensitive and bleeds easily. If you try to remove a heavy wax buildup with a cotton tipped swab, you may push it in further. Irrigation with water, suction, and small ear curettes may be used to clear out the wax. If the impaction is fixed to the skin in the ear canal, ear drops may be needed for a few days to loosen the wax. People who build up a lot of wax frequently can use ear wax removal products available in your local drugstore. SEEK MEDICAL CARE IF:  You develop an earache, increased hearing loss, or marked dizziness. Document Released: 09/13/2004 Document Revised: 07/26/2011 Document Reviewed: 11/03/2009 [facility] Patient Information 2012 [place], [place].

## 2012-02-13 NOTE — Assessment & Plan Note (Signed)
Along with hypothyroid sxs and strong family history - reasonable to check thyroid function.

## 2012-02-13 NOTE — Progress Notes (Signed)
  Subjective:    Patient ID: Grace [name], female    DOB: 1961-02-24, 51 y.o.   MRN: [id number]  HPI CC: ear pain  Last few days having right pain, feels muffled hearing when ear gets stopped up.  R>L.  Wears night guard, doesn't think TMJ.  Occasional pain at TMJ and at external ear.  Some PNDrainage and coughing.    H/o cerumen impaction.  Doesn't use qtips.  No recent swimming.  No fevers/chills, tinnitus, HA.  No abd pain.  Occasional use of benadryl.  Has checkup appt with OBGYN in September, usually gets physical with blood work then.    ?thyroid issues - would like TSH checked.  Feels tired, thinning hair, dry skin, some hoarseness, weight gain.  Strong fmhx thyroid disease (both hyper and hypo).  Wt Readings from Last 3 Encounters:  02/13/12 161 lb 4 oz (73.143 kg)  10/08/11 163 lb (73.936 kg)  07/09/11 159 lb 4 oz (72.235 kg)    Review of Systems Per HPI    Objective:   Physical Exam  Nursing note and vitals reviewed. Constitutional: She appears well-developed and well-nourished. No distress.  HENT:  Head: Normocephalic and atraumatic.  Right Ear: External ear normal. Decreased hearing is noted.  Left Ear: Hearing and external ear normal.  Mouth/Throat: Oropharynx is clear and moist. No oropharyngeal exudate.       no TMJ tenderness.  Bilateral cerumen impaction R>L, affecting hearing Manual disimpaction unsuccessful so irrigation performed, only partially successful, then repeat manual disimpaction with plastic curette performed - large amt wax removed bilaterally. Improved hearing afterwards.  TMs visualized and clear  Eyes: Conjunctivae and EOM are normal. Pupils are equal, round, and reactive to light.  Neck: Normal range of motion. Neck supple. Thyromegaly (mild on right side, no nodule appreciated) present.  Musculoskeletal: She exhibits no edema.  Skin: Skin is warm and dry. No rash noted.  Psychiatric: She has a normal mood and affect.         Assessment & Plan:

## 2012-02-13 NOTE — Assessment & Plan Note (Signed)
Irrigation then disimpaction performed. Pt tolerated well. Discussed home care. To update [place] if sxs of external infection/irritation for abx drops.

## 2012-02-14 LAB — T4, FREE: Free T4: 0.69 ng/dL (ref 0.60–1.60)

## 2012-06-23 ENCOUNTER — Other Ambulatory Visit: Payer: Self-pay | Admitting: Dermatology

## 2012-08-28 ENCOUNTER — Encounter: Payer: Self-pay | Admitting: *Deleted

## 2012-08-28 ENCOUNTER — Encounter: Payer: Self-pay | Admitting: Family Medicine

## 2012-08-28 ENCOUNTER — Ambulatory Visit (INDEPENDENT_AMBULATORY_CARE_PROVIDER_SITE_OTHER): Payer: BC Managed Care – PPO | Admitting: Family Medicine

## 2012-08-28 VITALS — BP 124/82 | HR 82 | Temp 98.2°F | Wt 154.5 lb

## 2012-08-28 DIAGNOSIS — J329 Chronic sinusitis, unspecified: Secondary | ICD-10-CM

## 2012-08-28 MED ORDER — AMOXICILLIN-POT CLAVULANATE 875-125 MG PO TABS: 1.0000 | ORAL_TABLET | Freq: Two times a day (BID) | ORAL | Status: AC

## 2012-08-28 NOTE — Patient Instructions (Signed)
You have a sinus infection, likely viral. Take medicine as prescribed if symptoms worsen or fail to improve - fill augmentin antibiotic course. Push fluids and plenty of rest. Nasal saline irrigation or neti pot to help drain sinuses. May use simple mucinex with plenty of fluid to help mobilize mucous. Let us know if fever >101.5, trouble opening/closing mouth, difficulty swallowing, or worsening - you may need to be seen again.

## 2012-08-28 NOTE — Assessment & Plan Note (Addendum)
Given short duration anticipate more viral illness. Discussed this. Supportive care discussed. Red flags to return discussed. Fill abx if sxs persist past 10 d or worsening.

## 2012-08-28 NOTE — Progress Notes (Signed)
  Subjective:    Patient ID: Grace [name], female    DOB: June 13, 1961, 52 y.o.   MRN: [id number]  HPI CC: cold sxs  sxs started 5d ago with PNDrainage.  ST.  sxs progressing.  Blowing nose with green/bloody mucous more on right side, also with dry cough.  + tooth ache.  Sinus pressure headache, worse with leaning forward.  Using tylenol, cough drops, and mucinex   No fevers/chills, abd pain, ear pain.  Has been exposed to a lot of illness - grandchildren, children, brother with flu, mother with flu. Did receive flu shot. No smokers at home. No h/o asthma.  No recent abx use.  Past Medical History  Diagnosis Date  . Pain in joint, site unspecified   . Unspecified gastritis and gastroduodenitis without mention of hemorrhage   . Headache   . Symptomatic menopausal or female climacteric states   . Nonspecific elevation of levels of transaminase or lactic acid dehydrogenase (LDH)   . Allergic rhinitis, cause unspecified   . Unspecified sinusitis (chronic)   . Back pain     epidural injections  . Foot fracture     Review of Systems Per HPI    Objective:   Physical Exam  Nursing note and vitals reviewed. Constitutional: She appears well-developed and well-nourished. No distress.       Mildly hoarse. Evidently congested.  HENT:  Head: Normocephalic and atraumatic.  Right Ear: Hearing, tympanic membrane, external ear and ear canal normal.  Left Ear: Hearing, tympanic membrane, external ear and ear canal normal.  Nose: No mucosal edema or rhinorrhea. Right sinus exhibits maxillary sinus tenderness and frontal sinus tenderness. Left sinus exhibits maxillary sinus tenderness and frontal sinus tenderness.  Mouth/Throat: Uvula is midline and mucous membranes are normal. Posterior oropharyngeal erythema present. No oropharyngeal exudate, posterior oropharyngeal edema or tonsillar abscesses.       Mild sinus tenderness to plapation  Eyes: Conjunctivae normal and EOM are normal.  Pupils are equal, round, and reactive to light. No scleral icterus.  Neck: Normal range of motion. Neck supple.  Cardiovascular: Normal rate, regular rhythm, normal heart sounds and intact distal pulses.   No murmur heard. Pulmonary/Chest: Effort normal and breath sounds normal. No respiratory distress. She has no wheezes. She has no rales.  Lymphadenopathy:    She has no cervical adenopathy.  Skin: Skin is warm and dry. No rash noted.       Assessment & Plan:

## 2013-03-23 ENCOUNTER — Ambulatory Visit (INDEPENDENT_AMBULATORY_CARE_PROVIDER_SITE_OTHER): Payer: BC Managed Care – PPO | Admitting: Family Medicine

## 2013-03-23 ENCOUNTER — Ambulatory Visit: Payer: BC Managed Care – PPO | Admitting: Family Medicine

## 2013-03-23 ENCOUNTER — Ambulatory Visit (INDEPENDENT_AMBULATORY_CARE_PROVIDER_SITE_OTHER)
Admission: RE | Admit: 2013-03-23 | Discharge: 2013-03-23 | Disposition: A | Discharge status: Home / Self Care | Payer: BC Managed Care – PPO | Source: Ambulatory Visit | Attending: Family Medicine | Admitting: Family Medicine

## 2013-03-23 ENCOUNTER — Encounter: Payer: Self-pay | Admitting: Family Medicine

## 2013-03-23 VITALS — BP 126/84 | HR 81 | Temp 98.6°F | Ht 64.0 in | Wt 143.5 lb

## 2013-03-23 DIAGNOSIS — R071 Chest pain on breathing: Secondary | ICD-10-CM

## 2013-03-23 DIAGNOSIS — R0789 Other chest pain: Secondary | ICD-10-CM

## 2013-03-23 IMAGING — CR DG RIBS 2V*L*
4 series · 4 of 4 positions shown · non-contrast
Comparison: [DATE] chest radiograph

CLINICAL DATA: Left-sided chest wall pain

LEFT RIBS - 2 VIEW

[view not recorded (1 of 4)]
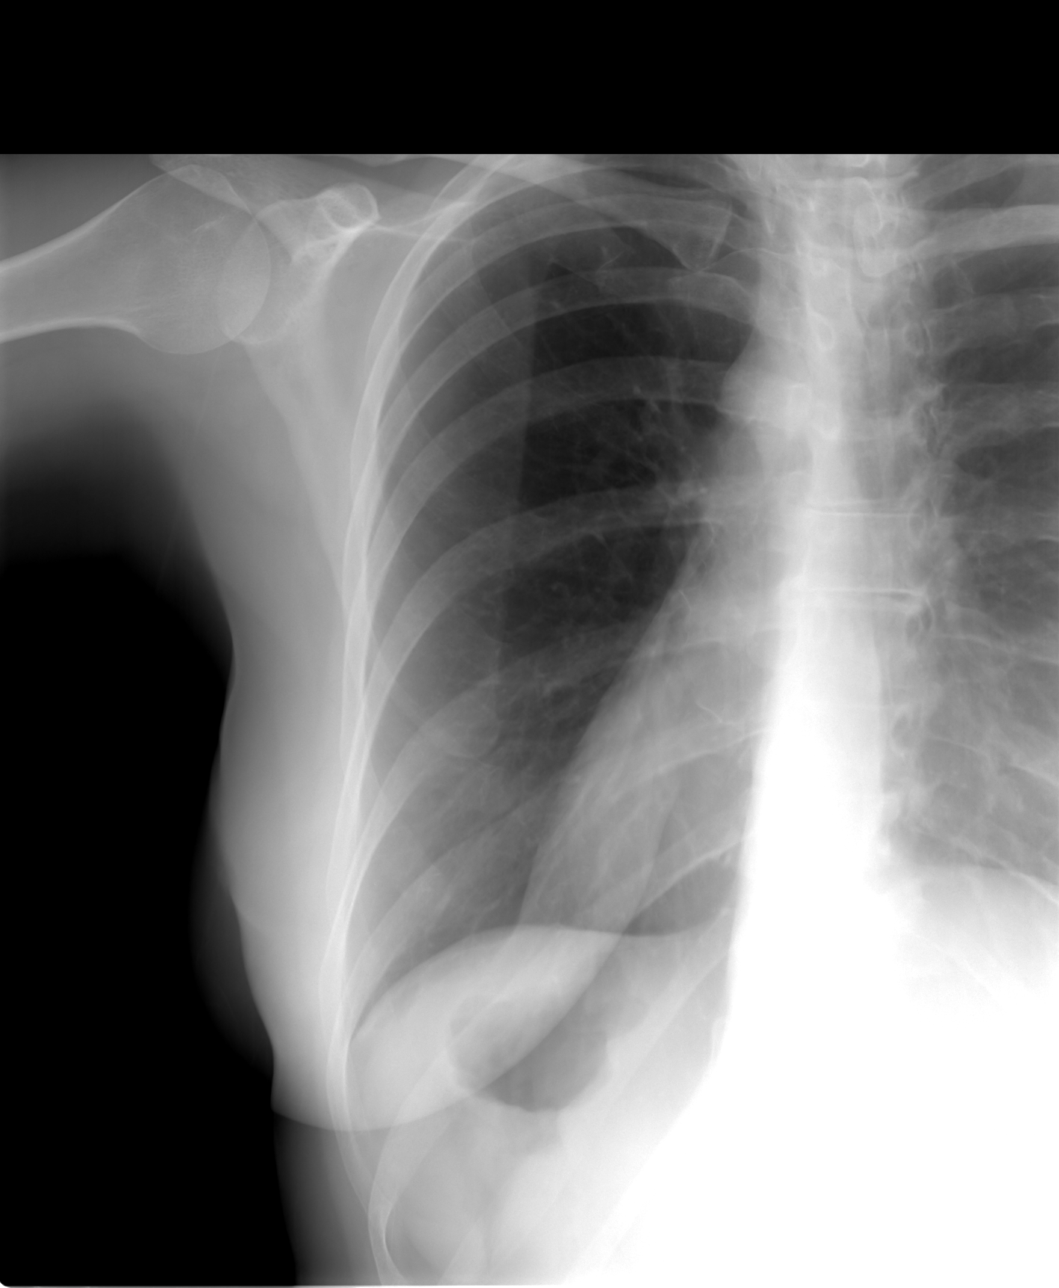

[view not recorded (2 of 4)]
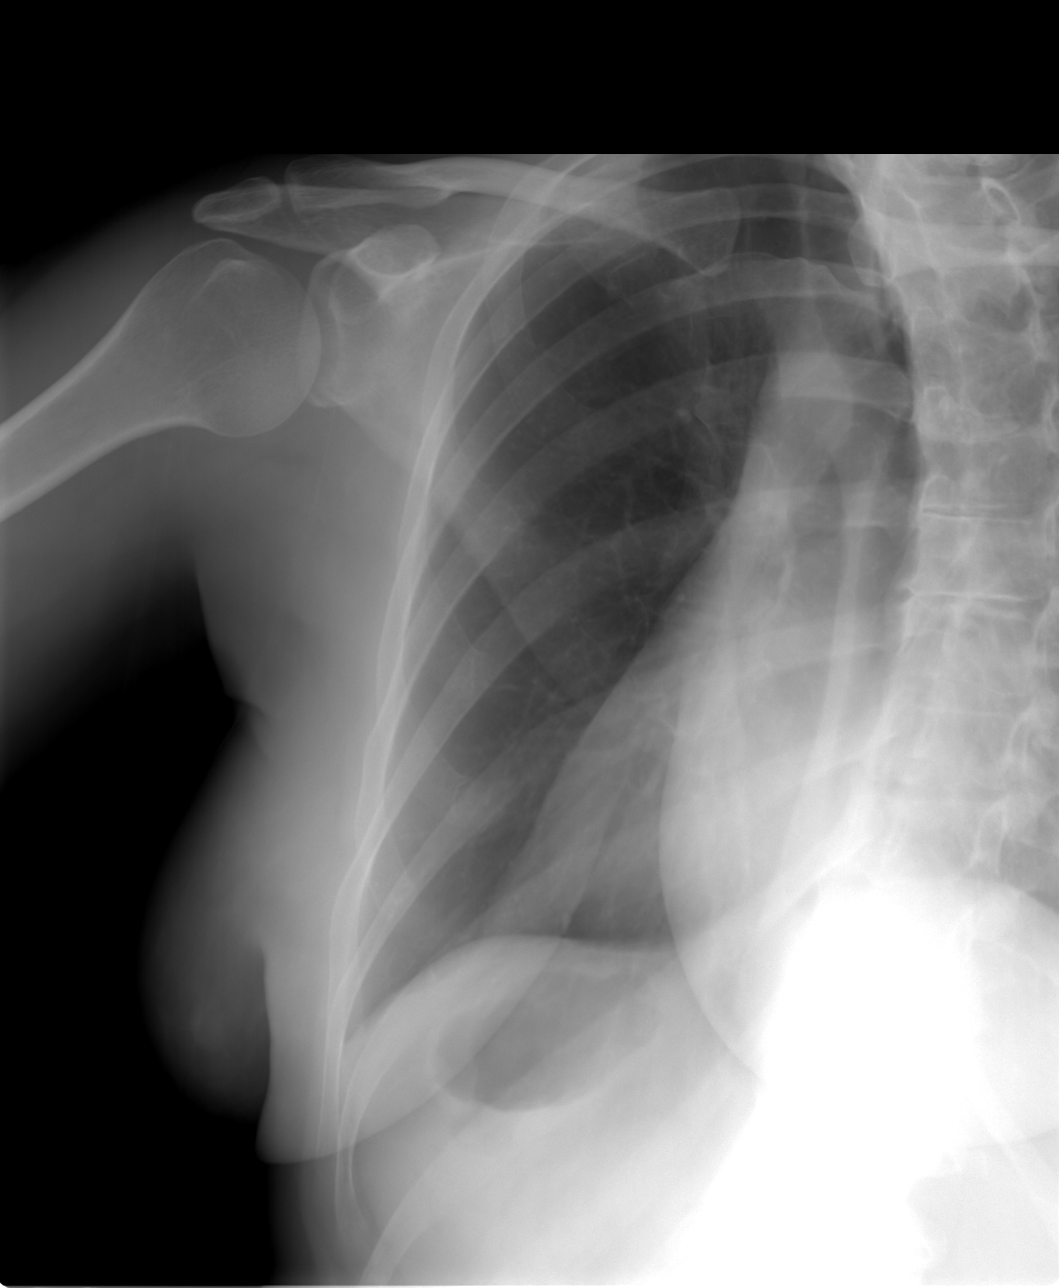

[view not recorded (3 of 4)]
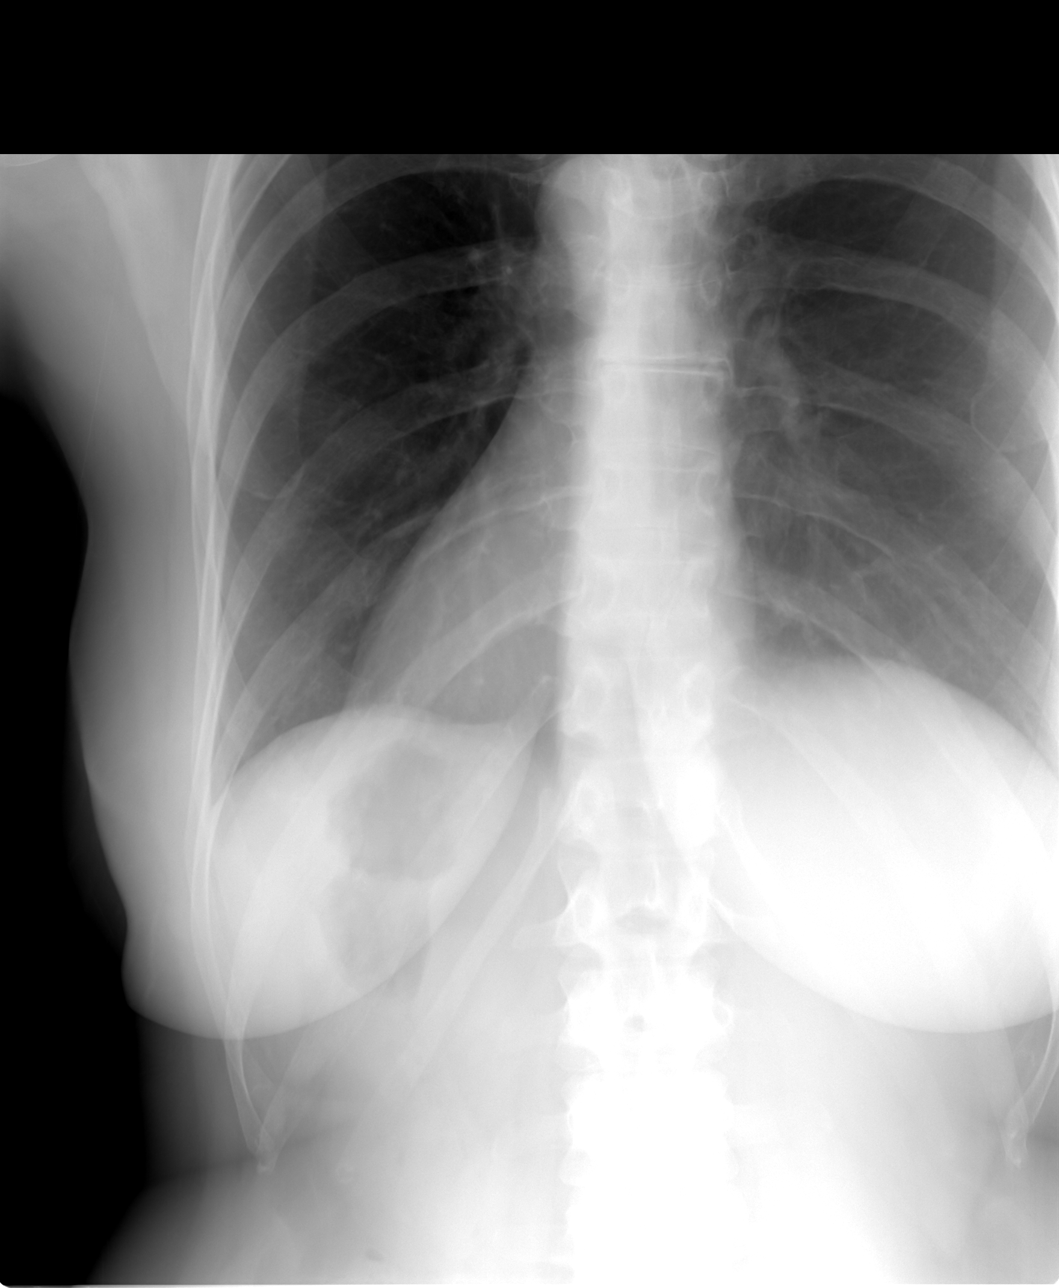

[view not recorded (4 of 4)]
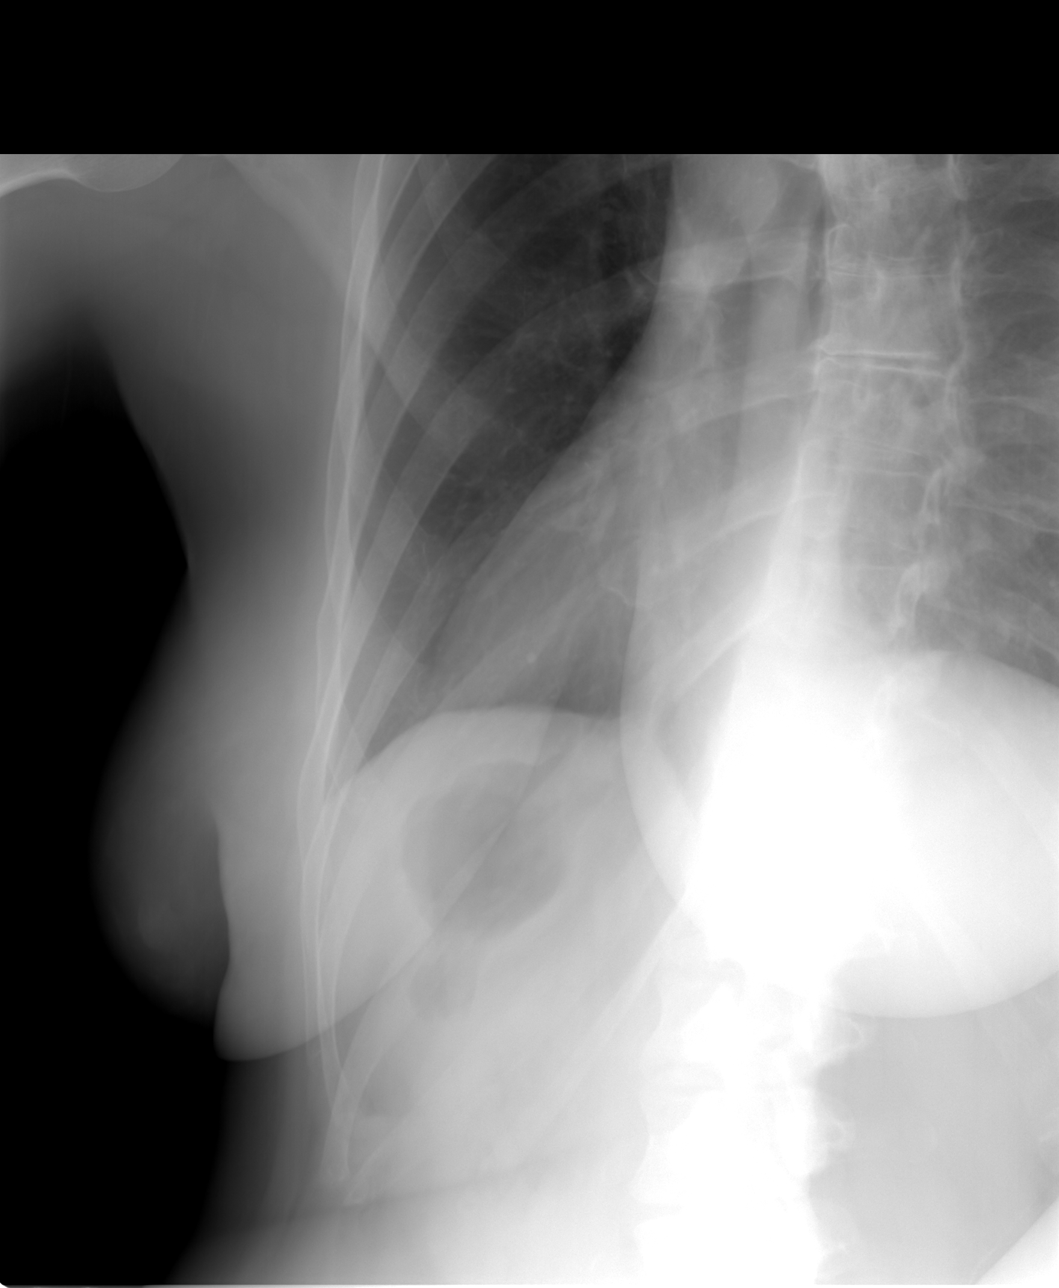

[4 of 4 positions shown; findings below may reference images not displayed]

FINDINGS: No displaced left-sided rib fracture.  No change from
recent prior exam.
IMPRESSION: No displaced left-sided rib fracture identified.

## 2013-03-23 IMAGING — CR DG CHEST 2V
2 series · 2 of 2 positions shown · non-contrast
Comparison: [DATE]

CLINICAL DATA: Chest pain left-sided

CHEST - 2 VIEW

[view not recorded (1 of 2)]
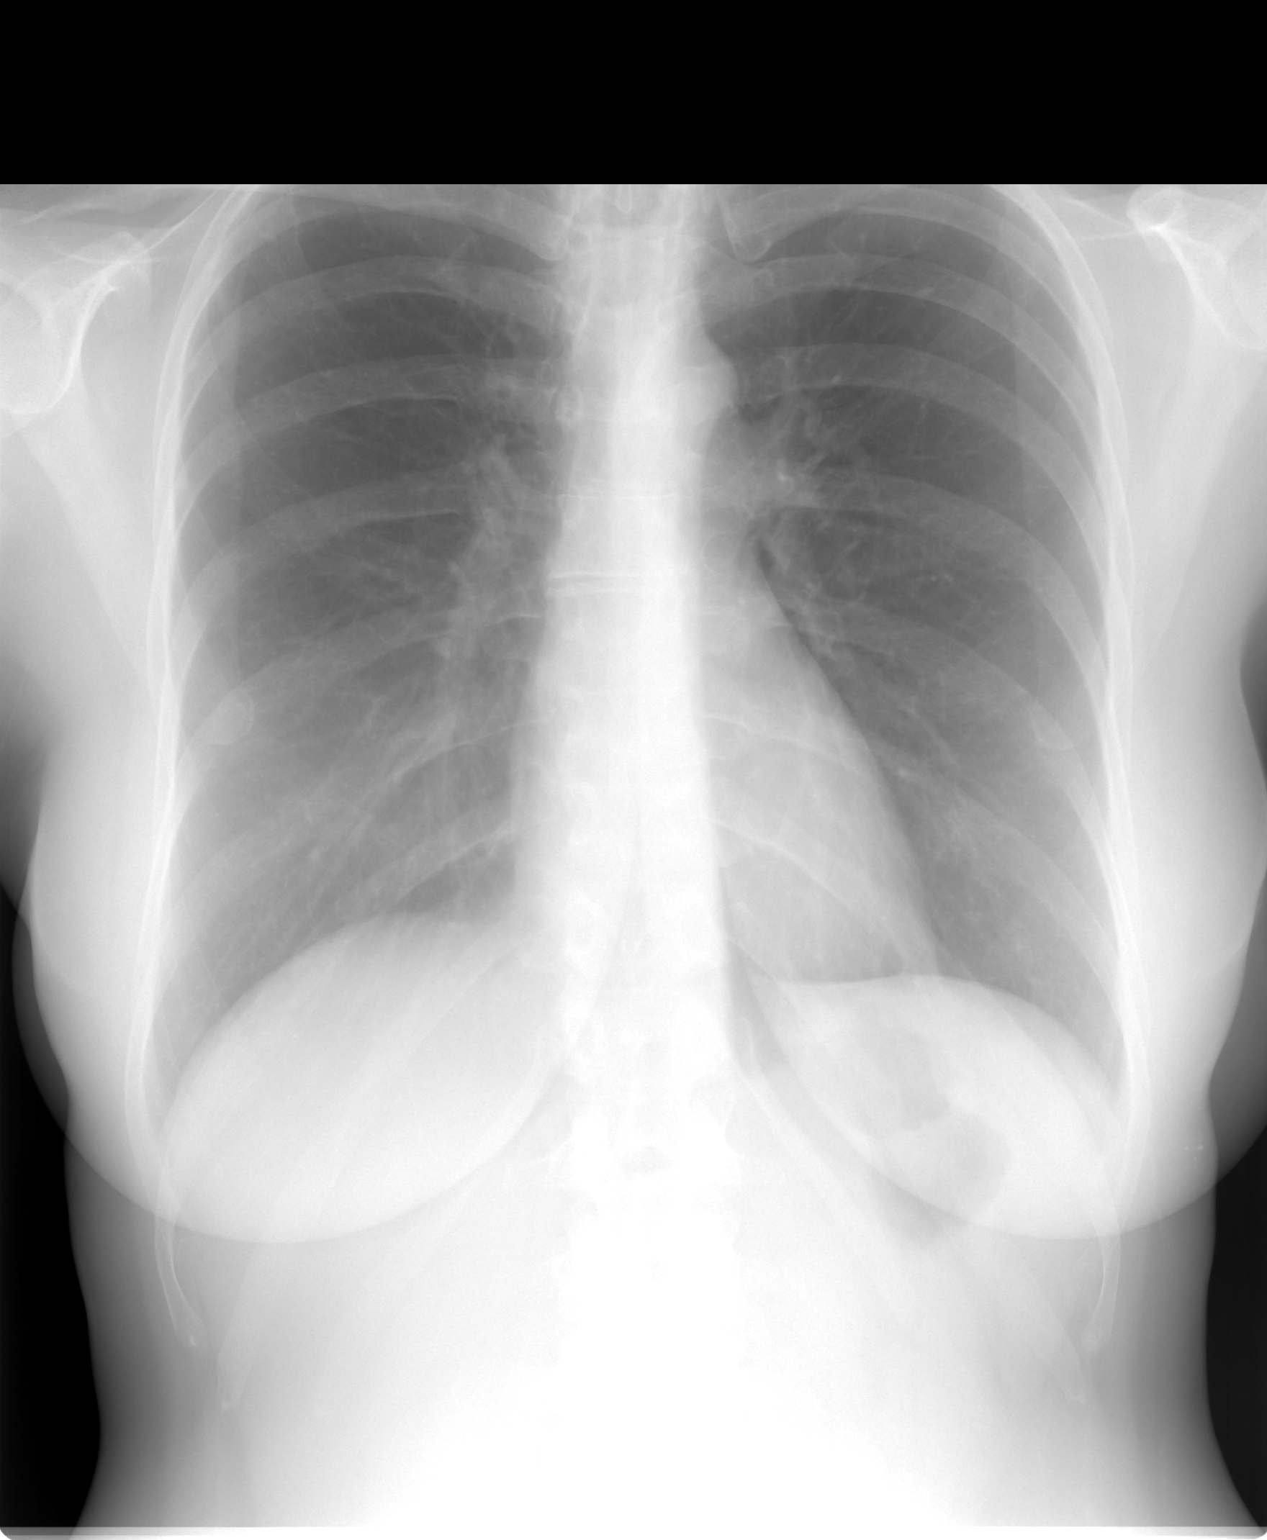

[view not recorded (2 of 2)]
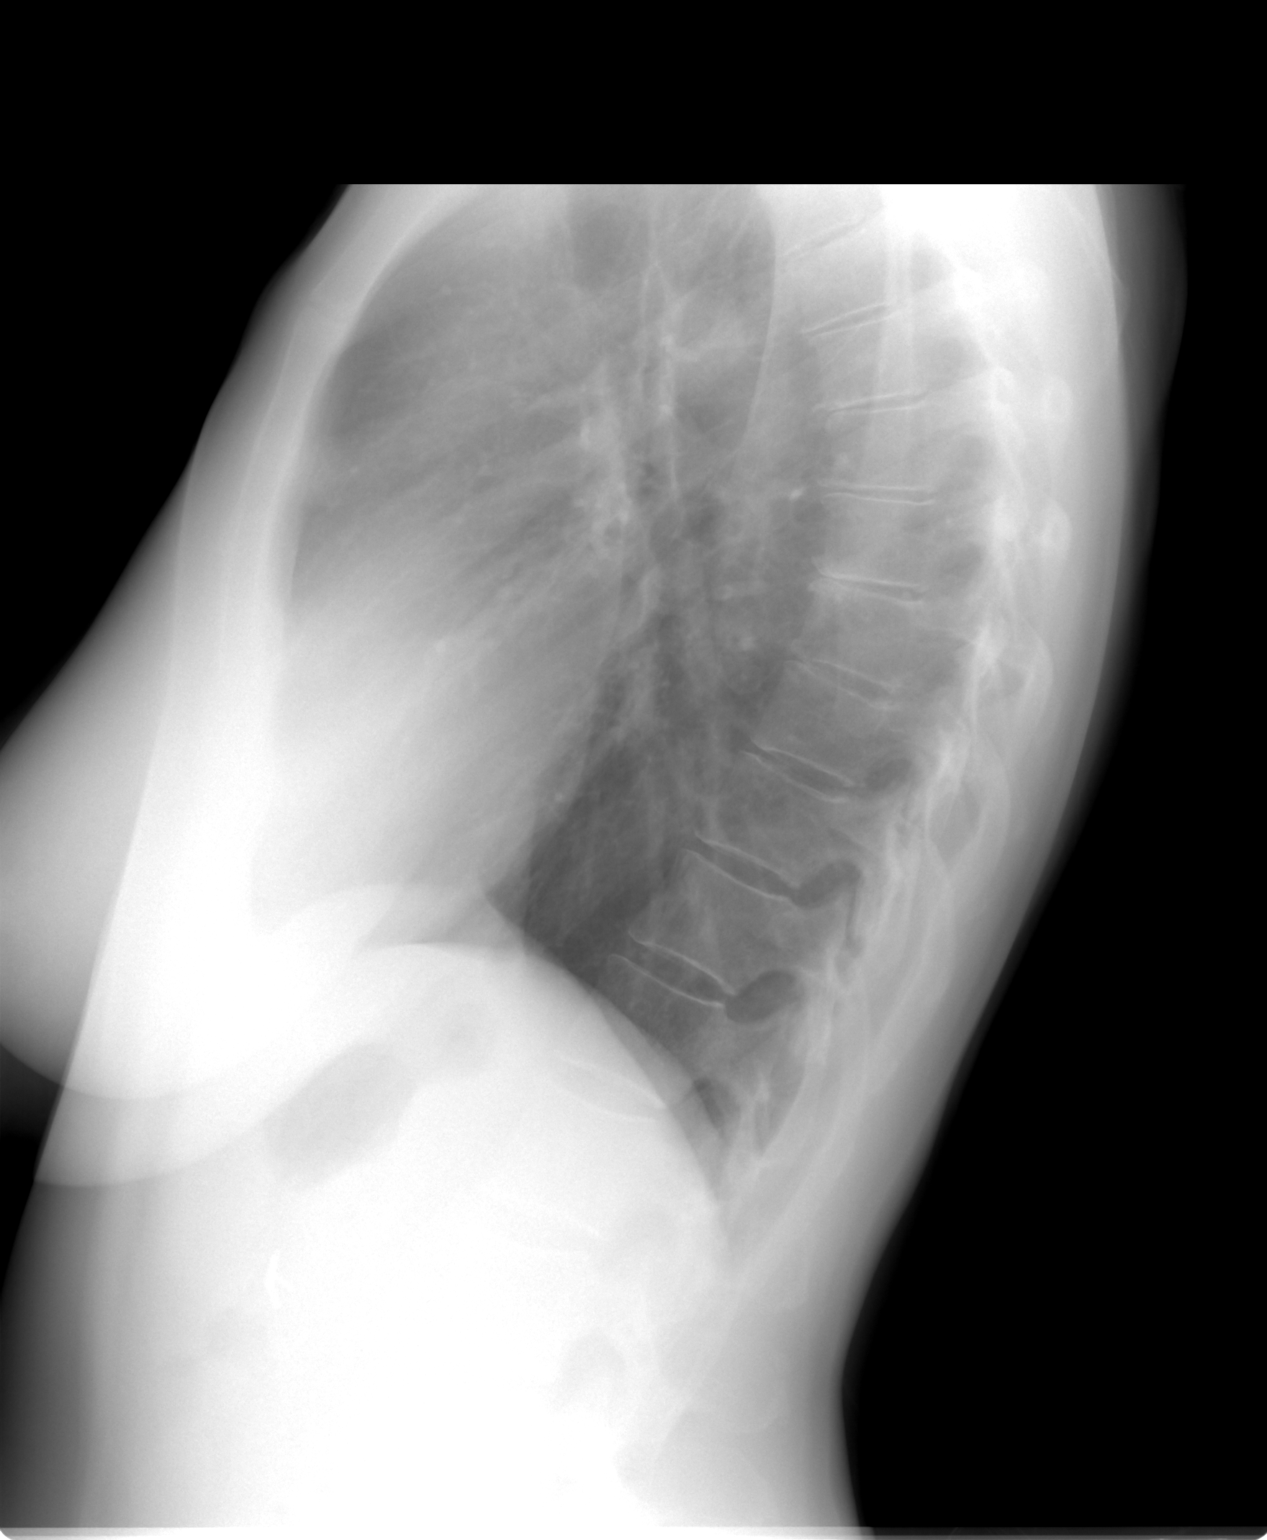

[2 of 2 positions shown; findings below may reference images not displayed]

FINDINGS: Heart size is normal.  Negative for heart failure.  Lungs
are clear without infiltrate or effusion.  Mild apical scarring
bilaterally is unchanged.  No pneumothorax.
IMPRESSION: No acute abnormality.

## 2013-03-23 NOTE — Progress Notes (Signed)
Subjective:    Patient ID: Grace [name], female    DOB: Dec 06, 1960, 52 y.o.   MRN: [id number]  HPI Here for rib pain   Drove home from [place] yesterday- she reached over to glove compartment  Thenstarted to have pain under her L breast with a popping feeling -- had to get out of the car and walk around to stretch out  Hurt to take a deep breath   She took 3 advil - that helped Got home-did some laundry , and took her bra off Later in the night - her pain came back worse- could not get comfortable Used some ice on it and took 2 more advil , finally went to bed with a lot of pillows  Is positional  Pain comes in waves-she thinks it feels like a spasm   No sob  No leg pain or swelling  On estrogen patch  No leg swelling   Right now symptoms are not too bad at all  occ gets palpitations-baseline for her   Patient Active Problem List   Diagnosis Date Noted  . Fatigue 02/13/2012  . NONSPEC ELEVATION OF LEVELS OF TRANSAMINASE/LDH 08/25/2010  . HEADACHE 04/10/2010  . ARTHRALGIA 12/21/2009  . MENOPAUSAL SYNDROME 07/12/2009  . RHINITIS 09/16/2008  . GASTRITIS 09/16/2008   Past Medical History  Diagnosis Date  . Pain in joint, site unspecified   . Unspecified gastritis and gastroduodenitis without mention of hemorrhage   . Headache(784.0)   . Symptomatic menopausal or female climacteric states   . Nonspecific elevation of levels of transaminase or lactic acid dehydrogenase (LDH)   . Allergic rhinitis, cause unspecified   . Unspecified sinusitis (chronic)   . Back pain     epidural injections  . Foot fracture    Past Surgical History  Procedure Laterality Date  . Ccy  2006  . Esophagogastroduodenoscopy  2006    gastritis  . Colonoscopy    . Foot fracture surgery  2010   History  Substance Use Topics  . Smoking status: Never Smoker   . Smokeless tobacco: Not on file  . Alcohol Use: No   Family History  Problem Relation Age of Onset  . Other Father     disk disease   . Arthritis      paternal side  . Other Brother     disk disease   Allergies  Allergen Reactions  . Celecoxib     REACTION: rash  . Codeine     REACTION: rash  . Ibuprofen     REACTION: GI side effects  . Morphine     REACTION: u/k  . Propoxyphene-Acetaminophen     REACTION: u/k  . Tramadol Hcl     REACTION: u/k   Current Outpatient Prescriptions on File Prior to Visit  Medication Sig Dispense Refill  . acetaminophen (TYLENOL) 500 MG tablet Take 500 mg by mouth every 6 (six) hours as needed.        Marland Kitchen b complex vitamins tablet Take 1 tablet by mouth daily.        . bimatoprost (LUMIGAN) 0.01 % SOLN Place 1 drop into both eyes at bedtime.        Marland Kitchen CALCIUM-MAGNESIUM-VITAMIN D PO Take 2 tablets by mouth daily.       . Cholecalciferol (VITAMIN D3) 5000 UNIT/ML LIQD Take by mouth daily.        . clidinium-chlordiazePOXIDE (LIBRAX) 2.5-5 MG per capsule Take 1 capsule by mouth every 4 (four) hours as needed.        Marland Kitchen  estradiol (VIVELLE-DOT) 0.1 MG/24HR Place 1 patch onto the skin 2 (two) times a week.        . fish oil-omega-3 fatty acids 1000 MG capsule Take 1 g by mouth daily.        . hydrochlorothiazide (HYDRODIURIL) 25 MG tablet Take 25 mg by mouth daily.      . Multiple Vitamin (MULTIVITAMIN) tablet Take 1 tablet by mouth daily.         No current facility-administered medications on file prior to visit.    Review of Systems Review of Systems  Constitutional: Negative for fever, appetite change, fatigue and unexpected weight change.  Eyes: Negative for pain and visual disturbance.  Respiratory: Negative for cough and shortness of breath.  pos for chest wall pain  Cardiovascular: Negative for PND/ orthopnea/ pedal edema , neg for sore spot or redness in legs  Gastrointestinal: Negative for nausea, diarrhea and constipation.  Genitourinary: Negative for urgency and frequency.  Skin: Negative for pallor or rash   Neurological: Negative for weakness, light-headedness, numbness  and headaches.  Hematological: Negative for adenopathy. Does not bruise/bleed easily.  Psychiatric/Behavioral: Negative for dysphoric mood. The patient is not nervous/anxious.         Objective:   Physical Exam  Constitutional: She appears well-developed and well-nourished.  HENT:  Head: Normocephalic and atraumatic.  Mouth/Throat: Oropharynx is clear and moist.  Eyes: Conjunctivae and EOM are normal. Pupils are equal, round, and reactive to light. Right eye exhibits no discharge. Left eye exhibits no discharge. No scleral icterus.  Neck: Normal range of motion. Neck supple. No JVD present. Carotid bruit is not present. No thyromegaly present.  Cardiovascular: Normal rate, regular rhythm, normal heart sounds and intact distal pulses.  Exam reveals no gallop.   No murmur heard. EKG NSR with rate of 73 and no acute changes  Pulmonary/Chest: Effort normal and breath sounds normal. No respiratory distress. She has no wheezes. She has no rales. She exhibits tenderness.  L chest wall tenderness under L breast over ribs without crepitus or skin change  Abdominal: Soft. Bowel sounds are normal. She exhibits no distension, no abdominal bruit and no mass. There is no tenderness.  Musculoskeletal: She exhibits no edema.  No leg swelling/ erythema/ warmth or palp cord  Neg homans sign  Lymphadenopathy:    She has no cervical adenopathy.  Neurological: She is alert. She has normal reflexes. No cranial nerve deficit. She exhibits normal muscle tone. Coordination normal.  Skin: Skin is warm and dry. No rash noted. No erythema.  Psychiatric: She has a normal mood and affect.          Assessment & Plan:

## 2013-03-23 NOTE — Patient Instructions (Addendum)
Xray of chest and ribs today  We will call you with results when the radiologist comments  EKG looks ok Try to gently stretch Use heat if helpful  Ibuprofen 400 mg with food up to every 6 hours is ok if no GI upset  If worse or short of breath or not improving in a week- alert [place] or seek care in ER if after hours

## 2013-03-23 NOTE — Assessment & Plan Note (Signed)
Suspect strain/sprain of rib musculature/costochondritis Disc tx with heat/ stretching/ nsaids Disc deep breaths to avoid atelectasis Cxr/ rib films neg for fx and EKG is reassuring as are vitals

## 2013-03-23 NOTE — Assessment & Plan Note (Signed)
Suspect from chest wall strain/ sprain - see assessment Adv if sudden sob or worse symptoms to update [place] and go to ER if after hours

## 2013-10-26 ENCOUNTER — Ambulatory Visit (INDEPENDENT_AMBULATORY_CARE_PROVIDER_SITE_OTHER): Payer: BC Managed Care – PPO | Admitting: Family Medicine

## 2013-10-26 ENCOUNTER — Encounter: Payer: Self-pay | Admitting: Family Medicine

## 2013-10-26 VITALS — BP 122/74 | HR 73 | Temp 98.3°F | Ht 64.0 in | Wt 137.5 lb

## 2013-10-26 DIAGNOSIS — J019 Acute sinusitis, unspecified: Secondary | ICD-10-CM | POA: Insufficient documentation

## 2013-10-26 MED ORDER — AMOXICILLIN-POT CLAVULANATE 875-125 MG PO TABS: 1.0000 | ORAL_TABLET | Freq: Two times a day (BID) | ORAL | Status: DC

## 2013-10-26 MED ORDER — BENZONATATE 200 MG PO CAPS: 200.0000 mg | ORAL_CAPSULE | Freq: Three times a day (TID) | ORAL | Status: DC | PRN

## 2013-10-26 NOTE — Patient Instructions (Signed)
Take augmentin for sinus infection as directed  Tessalon for cough  Also for cough -any otc medicine with DM  Lots of fluids/ rest Breathe steam/ use nasal saline spray or a netti pot/ warm compresses on face help Update if not starting to improve in a week or if worsening

## 2013-10-26 NOTE — Progress Notes (Signed)
Pre visit review using our clinic review tool, if applicable. No additional management support is needed unless otherwise documented below in the visit note. 

## 2013-10-26 NOTE — Assessment & Plan Note (Signed)
Cover with augmentin  Disc symptomatic care - see instructions on AVS Tessalon for cough  otc guif DM also if needed  Update if not starting to improve in a week or if worsening

## 2013-10-26 NOTE — Progress Notes (Signed)
Subjective:    Patient ID: Grace [name], female    DOB: 1961/03/01, 53 y.o.   MRN: [id number]  HPI Here for uri/sinus symptoms   Started getting uri symptoms 2 weeks ago  Caught a cold with her family  Just not getting better  Blowing and coughing up brown and green mucous R sided facial pain  Chills off and on - suspects a low grade fever   Cough is on and off  Very congested at night   Nyquil/ mucinex/ advil cold and sinus   Patient Active Problem List   Diagnosis Date Noted  . Chest pain on breathing 03/23/2013  . Left-sided chest wall pain 03/23/2013  . Fatigue 02/13/2012  . NONSPEC ELEVATION OF LEVELS OF TRANSAMINASE/LDH 08/25/2010  . HEADACHE 04/10/2010  . ARTHRALGIA 12/21/2009  . MENOPAUSAL SYNDROME 07/12/2009  . RHINITIS 09/16/2008  . GASTRITIS 09/16/2008   Past Medical History  Diagnosis Date  . Pain in joint, site unspecified   . Unspecified gastritis and gastroduodenitis without mention of hemorrhage   . Headache(784.0)   . Symptomatic menopausal or female climacteric states   . Nonspecific elevation of levels of transaminase or lactic acid dehydrogenase (LDH)   . Allergic rhinitis, cause unspecified   . Unspecified sinusitis (chronic)   . Back pain     epidural injections  . Foot fracture    Past Surgical History  Procedure Laterality Date  . Ccy  2006  . Esophagogastroduodenoscopy  2006    gastritis  . Colonoscopy    . Foot fracture surgery  2010   History  Substance Use Topics  . Smoking status: Never Smoker   . Smokeless tobacco: Not on file  . Alcohol Use: No   Family History  Problem Relation Age of Onset  . Other Father     disk disease  . Arthritis      paternal side  . Other Brother     disk disease   Allergies  Allergen Reactions  . Celecoxib     REACTION: rash  . Codeine     REACTION: rash  . Ibuprofen     REACTION: GI side effects  . Morphine     REACTION: u/k  . Propoxyphene N-Acetaminophen     REACTION: u/k  .  Tramadol Hcl     REACTION: u/k   Current Outpatient Prescriptions on File Prior to Visit  Medication Sig Dispense Refill  . b complex vitamins tablet Take 1 tablet by mouth daily.        . bimatoprost (LUMIGAN) 0.01 % SOLN Place 1 drop into both eyes at bedtime.        Marland Kitchen. CALCIUM-MAGNESIUM-VITAMIN D PO Take 2 tablets by mouth daily.       . Cholecalciferol (VITAMIN D3) 5000 UNIT/ML LIQD Take by mouth daily.        . clidinium-chlordiazePOXIDE (LIBRAX) 2.5-5 MG per capsule Take 1 capsule by mouth every 4 (four) hours as needed.        Marland Kitchen. estradiol (VIVELLE-DOT) 0.1 MG/24HR Place 1 patch onto the skin 2 (two) times a week.        . fish oil-omega-3 fatty acids 1000 MG capsule Take 1 g by mouth daily.        . hydrochlorothiazide (HYDRODIURIL) 25 MG tablet Take 25 mg by mouth daily.      . Multiple Vitamin (MULTIVITAMIN) tablet Take 1 tablet by mouth daily.         No current facility-administered medications on file  prior to visit.      Review of Systems Review of Systems  Constitutional: Negative for fever, appetite change, fatigue and unexpected weight change.  Eyes: Negative for pain and visual disturbance.  ENT pos for congestion /facial pain/ ear pain and post nasal drip Respiratory: Negative for wheeze  and shortness of breath.   Cardiovascular: Negative for cp or palpitations    Gastrointestinal: Negative for nausea, diarrhea and constipation.  Genitourinary: Negative for urgency and frequency.  Skin: Negative for pallor or rash   Neurological: Negative for weakness, light-headedness, numbness and headaches.  Hematological: Negative for adenopathy. Does not bruise/bleed easily.  Psychiatric/Behavioral: Negative for dysphoric mood. The patient is not nervous/anxious.         Objective:   Physical Exam  Constitutional: She appears well-developed and well-nourished. No distress.  HENT:  Head: Normocephalic and atraumatic.  Right Ear: External ear normal.  Left Ear: External  ear normal.  Mouth/Throat: Oropharynx is clear and moist. No oropharyngeal exudate.  Nares are injected and congested   Bilateral maxillary sinus tenderness worse on the R TMs are dull     Eyes: Conjunctivae and EOM are normal. Pupils are equal, round, and reactive to light. Right eye exhibits no discharge. Left eye exhibits no discharge.  Neck: Normal range of motion. Neck supple.  Cardiovascular: Normal rate and regular rhythm.   Pulmonary/Chest: Effort normal and breath sounds normal. No respiratory distress. She has no wheezes. She has no rales.  Lymphadenopathy:    She has no cervical adenopathy.  Neurological: She is alert.  Skin: Skin is warm and dry. No rash noted.  Psychiatric: She has a normal mood and affect.          Assessment & Plan:

## 2013-10-29 ENCOUNTER — Telehealth: Payer: Self-pay

## 2013-10-29 NOTE — Telephone Encounter (Signed)
I do not have any other options besides otc DM preparation and her tessalon (there are not generally many options)- an antihistamine like benadryl will sometimes help  tussionex has cross over with morphine and codeine so have her absolutely assure you she is not allergic to it -send this back to me and I will write tomorrow when I am back in the office

## 2013-10-29 NOTE — Telephone Encounter (Signed)
All the other px cough meds have narcotic in them - her allergy list has codeine and also morphine - can she clarify her rxn to those - I do not want to try hydrocodone if she has rxn to these

## 2013-10-29 NOTE — Telephone Encounter (Signed)
Pt called back; pt said codeine and morphine caused rash on arms and swelling in her face; but she did not have swelling in her throat or difficulty breathing. Pt said she has taken Tussend before without a reaction. Advised if Tussend or Tussionex were to be prescribed would need to pick up rx. Pt said OK but what can she take tonight for the cough.Please advise.

## 2013-10-29 NOTE — Telephone Encounter (Signed)
Pt left v/m; pt was seen on 10/26/13; Tessalon, and OTC cough med not helping cough. Pt request different cough med sent to [facility]. Pt said she may leave work and take 1/2 sick day.Please advise.

## 2013-10-30 NOTE — Telephone Encounter (Signed)
Left voicemail requesting pt to call office 

## 2013-11-10 NOTE — Telephone Encounter (Signed)
Pt never responded to my call/voicemail

## 2014-05-17 ENCOUNTER — Other Ambulatory Visit: Payer: Self-pay | Admitting: Obstetrics and Gynecology

## 2014-05-18 LAB — CYTOLOGY - PAP

## 2014-10-29 ENCOUNTER — Ambulatory Visit (INDEPENDENT_AMBULATORY_CARE_PROVIDER_SITE_OTHER): Payer: BC Managed Care – PPO | Admitting: Family Medicine

## 2014-10-29 ENCOUNTER — Encounter: Payer: Self-pay | Admitting: Family Medicine

## 2014-10-29 VITALS — BP 120/78 | HR 83 | Temp 98.2°F | Wt 144.4 lb

## 2014-10-29 DIAGNOSIS — R1310 Dysphagia, unspecified: Secondary | ICD-10-CM

## 2014-10-29 DIAGNOSIS — J0110 Acute frontal sinusitis, unspecified: Secondary | ICD-10-CM

## 2014-10-29 DIAGNOSIS — R002 Palpitations: Secondary | ICD-10-CM | POA: Diagnosis not present

## 2014-10-29 MED ORDER — OMEPRAZOLE 20 MG PO CPDR: 20.0000 mg | DELAYED_RELEASE_CAPSULE | Freq: Every day | ORAL | Status: DC

## 2014-10-29 MED ORDER — AMOXICILLIN-POT CLAVULANATE 875-125 MG PO TABS: 1.0000 | ORAL_TABLET | Freq: Two times a day (BID) | ORAL | Status: DC

## 2014-10-29 NOTE — Progress Notes (Signed)
Subjective:    Patient ID: Grace [name], female    DOB: Apr 30, 1961, 54 y.o.   MRN: [id number]  HPI Here with cough after a sinusitis   Cough is difficult to control  Has it all day  Can hear congestion rattling  Had URI in Jan-cannot shake it   Took nyquil cough (helped a little)  Trying bendadryl at night  Tried delsym   Some palpitations - has a feeling of heart racing before she coughs  This made her nervous   Also does not swallow "as easy" as she used to  No heartburn  No acid reflux med  Has had problems getting food down   Wondered about poss thyroid issues Lab Results  Component Value Date   TSH 2.14 02/13/2012     Patient Active Problem List   Diagnosis Date Noted  . Palpitations 10/29/2014  . Dysphagia 10/29/2014  . Acute sinusitis 10/26/2013  . NONSPEC ELEVATION OF LEVELS OF TRANSAMINASE/LDH 08/25/2010  . HEADACHE 04/10/2010  . ARTHRALGIA 12/21/2009  . MENOPAUSAL SYNDROME 07/12/2009  . RHINITIS 09/16/2008  . GASTRITIS 09/16/2008   Past Medical History  Diagnosis Date  . Pain in joint, site unspecified   . Unspecified gastritis and gastroduodenitis without mention of hemorrhage   . Headache(784.0)   . Symptomatic menopausal or female climacteric states   . Nonspecific elevation of levels of transaminase or lactic acid dehydrogenase (LDH)   . Allergic rhinitis, cause unspecified   . Unspecified sinusitis (chronic)   . Back pain     epidural injections  . Foot fracture    Past Surgical History  Procedure Laterality Date  . Ccy  2006  . Esophagogastroduodenoscopy  2006    gastritis  . Colonoscopy    . Foot fracture surgery  2010   History  Substance Use Topics  . Smoking status: Never Smoker   . Smokeless tobacco: Not on file  . Alcohol Use: No   Family History  Problem Relation Age of Onset  . Other Father     disk disease  . Arthritis      paternal side  . Other Brother     disk disease   Allergies  Allergen Reactions  .  Celecoxib     REACTION: rash  . Codeine     REACTION: rash  . Ibuprofen     REACTION: GI side effects  . Morphine     REACTION: u/k  . Propoxyphene N-Acetaminophen     REACTION: u/k  . Tramadol Hcl     REACTION: u/k   Current Outpatient Prescriptions on File Prior to Visit  Medication Sig Dispense Refill  . b complex vitamins tablet Take 1 tablet by mouth daily.      . benzonatate (TESSALON) 200 MG capsule Take 1 capsule (200 mg total) by mouth 3 (three) times daily as needed for cough. 30 capsule 0  . bimatoprost (LUMIGAN) 0.01 % SOLN Place 1 drop into both eyes at bedtime.      Marland Kitchen. CALCIUM-MAGNESIUM-VITAMIN D PO Take 2 tablets by mouth daily.     . Cholecalciferol (VITAMIN D3) 5000 UNIT/ML LIQD Take by mouth daily.      . clidinium-chlordiazePOXIDE (LIBRAX) 2.5-5 MG per capsule Take 1 capsule by mouth every 4 (four) hours as needed.      Marland Kitchen. estradiol (VIVELLE-DOT) 0.1 MG/24HR Place 1 patch onto the skin 2 (two) times a week.      . fish oil-omega-3 fatty acids 1000 MG capsule Take 1 g  by mouth daily.      . hydrochlorothiazide (HYDRODIURIL) 25 MG tablet Take 25 mg by mouth daily.    . Multiple Vitamin (MULTIVITAMIN) tablet Take 1 tablet by mouth daily.       No current facility-administered medications on file prior to visit.    Review of Systems Review of Systems  Constitutional: Negative for fever, appetite change,  and unexpected weight change.  Pos for cong and rhinorrhea and facial pain  Eyes: Negative for pain and visual disturbance.  Respiratory: Negative for wheeze and shortness of breath.   Cardiovascular: Negative for cp or palpitations    Gastrointestinal: Negative for nausea, diarrhea and constipation.neg for abd pain or heartburn or dark stool, pos for trouble swallowing   Genitourinary: Negative for urgency and frequency.  Skin: Negative for pallor or rash   Neurological: Negative for weakness, light-headedness, numbness and headaches.  Hematological: Negative for  adenopathy. Does not bruise/bleed easily.  Psychiatric/Behavioral: Negative for dysphoric mood. The patient is not nervous/anxious.         Objective:   Physical Exam  Constitutional: She appears well-developed and well-nourished. No distress.  HENT:  Head: Normocephalic and atraumatic.  Right Ear: External ear normal.  Left Ear: External ear normal.  Mouth/Throat: Oropharynx is clear and moist. No oropharyngeal exudate.  Nares are injected and congested  Bilateral mild maxillary sinus tenderness  Throat clear with post nasal drip    Eyes: Conjunctivae and EOM are normal. Pupils are equal, round, and reactive to light. Right eye exhibits no discharge. Left eye exhibits no discharge. No scleral icterus.  Neck: Normal range of motion. Neck supple.  Cardiovascular: Normal rate, regular rhythm and normal heart sounds.   No murmur heard. Pulmonary/Chest: Effort normal and breath sounds normal. No respiratory distress. She has no wheezes. She has no rales.  Abdominal: Soft. Bowel sounds are normal. She exhibits no distension and no mass. There is no tenderness. There is no rebound and no guarding.  Musculoskeletal: She exhibits no edema.  Lymphadenopathy:    She has no cervical adenopathy.  Neurological: She is alert. She has normal reflexes. No cranial nerve deficit.  Skin: Skin is warm and dry. No rash noted. No pallor.  Psychiatric: She has a normal mood and affect.          Assessment & Plan:   Problem List Items Addressed This Visit      Respiratory   Acute sinusitis    With ongoing cough since uri in Jan  Cover with augmentin  Disc symptomatic care - see instructions on AVS   Update if not starting to improve in a week or if worsening        Relevant Medications   amoxicillin-clavulanate (AUGMENTIN) 875-125 MG per tablet     Digestive   Dysphagia    Disc poss that this is coming from GERD and that cough is another symptom (even though she does not have typical  heartburn) tx with omeprazole  Given handout of foods to limit  F/u planned If no improvement-will ref to GI        Other   Palpitations - Primary    Ongoing but worse with cough  Nl EKG Disc limiting/ stopping caffeine  Will try to eliminate cough by treating sinusitis and reflux  If no improvement -disc ref to cardiology      Relevant Orders   EKG 12-Lead (Completed)

## 2014-10-29 NOTE — Patient Instructions (Signed)
For sinusitis with cough -please take augmentin as directed Take any allergy medicine you need  Try omeprazole (generic prilosec) for swallowing problems and cough as well (that may be caused by acid reflux) EKG is normal-but if palpitations worsen please let me know  Avoid caffeine   Follow up with me in 6-8 weeks

## 2014-10-31 NOTE — Assessment & Plan Note (Signed)
Ongoing but worse with cough  Nl EKG Disc limiting/ stopping caffeine  Will try to eliminate cough by treating sinusitis and reflux  If no improvement -disc ref to cardiology

## 2014-10-31 NOTE — Assessment & Plan Note (Signed)
With ongoing cough since uri in Jan  Cover with augmentin  Disc symptomatic care - see instructions on AVS   Update if not starting to improve in a week or if worsening

## 2014-10-31 NOTE — Assessment & Plan Note (Signed)
Disc poss that this is coming from GERD and that cough is another symptom (even though she does not have typical heartburn) tx with omeprazole  Given handout of foods to limit  F/u planned If no improvement-will ref to GI

## 2014-11-01 ENCOUNTER — Telehealth: Payer: Self-pay | Admitting: Family Medicine

## 2014-11-01 MED ORDER — LEVOFLOXACIN 500 MG PO TABS: 500.0000 mg | ORAL_TABLET | Freq: Every day | ORAL | Status: DC

## 2014-11-01 NOTE — Telephone Encounter (Signed)
Stop augmentin  Please call in levaquin  Let us know if no improvement

## 2014-11-01 NOTE — Telephone Encounter (Signed)
Patient advised to stop augmentin and start levaquin.  She will call if no improvement.

## 2014-11-01 NOTE — Telephone Encounter (Signed)
Patient Name: [name] DOB: 12-11-1960 Initial Comment Caller states, was seen on Friday, sinus infection, given amoxicillin, now has diarrhea Nurse Assessment Nurse: Elijah Birkaldwell, RN, Lynda Date/Time (Eastern Time): 11/01/2014 8:43:27 AM Confirm and document reason for call. If symptomatic, describe symptoms. ---Caller states she was seen on Friday for sinus infection, given Augmentin with gurgling, started on Sat, now has frequent watery/loose diarrhea. Taking an acid reflux medication - Omeprazole 20 mg. Has the patient traveled out of the country within the last 30 days? ---Not Applicable Does the patient require triage? ---Yes Related visit to physician within the last 2 weeks? ---Yes Does the PT have any chronic conditions? (i.e. diabetes, asthma, etc.) ---Yes List chronic conditions. ---IBS Guidelines Guideline Title Affirmed Question Affirmed Notes Diarrhea Mild diarrhea (all triage questions negative) Final Disposition User Call PCP within 24 Hours Fayettealdwell, RN, Stark BrayLynda Comments Caller states she started taking the Amoxy/Clav 875/125 mg on Sat. night. Has had burning sensation when she is about to have diarrhea episode, about every 10-15 mins. Would like to know whether to continue with this antibiotic or do something different.

## 2014-12-13 ENCOUNTER — Ambulatory Visit (INDEPENDENT_AMBULATORY_CARE_PROVIDER_SITE_OTHER): Payer: BC Managed Care – PPO | Admitting: Family Medicine

## 2014-12-13 ENCOUNTER — Encounter: Payer: Self-pay | Admitting: Family Medicine

## 2014-12-13 VITALS — BP 120/86 | HR 74 | Temp 98.5°F | Ht 64.0 in | Wt 140.5 lb

## 2014-12-13 DIAGNOSIS — F4320 Adjustment disorder, unspecified: Secondary | ICD-10-CM | POA: Insufficient documentation

## 2014-12-13 DIAGNOSIS — R002 Palpitations: Secondary | ICD-10-CM | POA: Diagnosis not present

## 2014-12-13 DIAGNOSIS — B9789 Other viral agents as the cause of diseases classified elsewhere: Principal | ICD-10-CM

## 2014-12-13 DIAGNOSIS — J069 Acute upper respiratory infection, unspecified: Secondary | ICD-10-CM | POA: Diagnosis not present

## 2014-12-13 DIAGNOSIS — F4321 Adjustment disorder with depressed mood: Secondary | ICD-10-CM

## 2014-12-13 NOTE — Assessment & Plan Note (Signed)
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

## 2014-12-13 NOTE — Progress Notes (Signed)
Pre visit review using our clinic review tool, if applicable. No additional management support is needed unless otherwise documented below in the visit note. 

## 2014-12-13 NOTE — Assessment & Plan Note (Signed)
Re assuring exam Will watch for s/s of sinusitis Disc symptomatic care - see instructions on AVS Update if not starting to improve in a week or if worsening   Written off work tomorrow

## 2014-12-13 NOTE — Progress Notes (Signed)
Subjective:    Patient ID: Grace LeatherwoodWanda I Oneil, female    DOB: July 22, 1961, 54 y.o.   MRN: [id number]  HPI Here for f/u of cough and gerd   Lost her mother - funeral was yesterday  Had heart problems and COPD - was briefly in palliative care   Was exp to another uri  Still cough and drainage - but sinus pain is improved   Coughing up green/ grey mucous  Head congestion - lightened up   prilosec- tried 2-3 weeks - does not think that caused her dysphagia  A lot of drainage - thinks that was the cause   Wonders if she has thyroid issues Lab Results  Component Value Date   TSH 2.14 02/13/2012    Would like that checked  Some palpitations- thinks that is stress related Tired  Hair is coming out    Patient Active Problem List   Diagnosis Date Noted  . Palpitations 10/29/2014  . Dysphagia 10/29/2014  . Acute sinusitis 10/26/2013  . NONSPEC ELEVATION OF LEVELS OF TRANSAMINASE/LDH 08/25/2010  . HEADACHE 04/10/2010  . ARTHRALGIA 12/21/2009  . MENOPAUSAL SYNDROME 07/12/2009  . RHINITIS 09/16/2008  . GASTRITIS 09/16/2008   Past Medical History  Diagnosis Date  . Pain in joint, site unspecified   . Unspecified gastritis and gastroduodenitis without mention of hemorrhage   . Headache(784.0)   . Symptomatic menopausal or female climacteric states   . Nonspecific elevation of levels of transaminase or lactic acid dehydrogenase (LDH)   . Allergic rhinitis, cause unspecified   . Unspecified sinusitis (chronic)   . Back pain     epidural injections  . Foot fracture    Past Surgical History  Procedure Laterality Date  . Ccy  2006  . Esophagogastroduodenoscopy  2006    gastritis  . Colonoscopy    . Foot fracture surgery  2010   History  Substance Use Topics  . Smoking status: Never Smoker   . Smokeless tobacco: Not on file  . Alcohol Use: No   Family History  Problem Relation Age of Onset  . Other Father     disk disease  . Arthritis      paternal side  . Other  Brother     disk disease   Allergies  Allergen Reactions  . Augmentin [Amoxicillin-Pot Clavulanate] Diarrhea  . Celecoxib     REACTION: rash  . Codeine     REACTION: rash  . Morphine     REACTION: u/k  . Propoxyphene N-Acetaminophen     REACTION: u/k  . Tramadol Hcl     REACTION: u/k   Current Outpatient Prescriptions on File Prior to Visit  Medication Sig Dispense Refill  . b complex vitamins tablet Take 1 tablet by mouth daily.      . bimatoprost (LUMIGAN) 0.01 % SOLN Place 1 drop into both eyes at bedtime.      Marland Kitchen. CALCIUM-MAGNESIUM-VITAMIN D PO Take 2 tablets by mouth daily.     . Cholecalciferol (VITAMIN D3) 5000 UNIT/ML LIQD Take by mouth daily.      . clidinium-chlordiazePOXIDE (LIBRAX) 2.5-5 MG per capsule Take 1 capsule by mouth every 4 (four) hours as needed.      Marland Kitchen. estradiol (VIVELLE-DOT) 0.1 MG/24HR Place 1 patch onto the skin 2 (two) times a week.      . fish oil-omega-3 fatty acids 1000 MG capsule Take 1 g by mouth daily.      . hydrochlorothiazide (HYDRODIURIL) 25 MG tablet Take 25 mg by  mouth daily.    . Multiple Vitamin (MULTIVITAMIN) tablet Take 1 tablet by mouth daily.       No current facility-administered medications on file prior to visit.       Review of Systems Review of Systems  Constitutional: Negative for fever, appetite change,  and unexpected weight change. Pos for fatigue  ENT pos for cong and rhinorrhea/ neg for sinus pain  Eyes: Negative for pain and visual disturbance.  Respiratory: Negative for wheeze and shortness of breath.   Cardiovascular: Negative for cp or palpitations    Gastrointestinal: Negative for nausea, diarrhea and constipation.  Genitourinary: Negative for urgency and frequency.  Skin: Negative for pallor or rash  pos for mild hair loss lately Neurological: Negative for weakness, light-headedness, numbness and headaches.  Hematological: Negative for adenopathy. Does not bruise/bleed easily.  Psychiatric/Behavioral: Negative  for dysphoric mood. The patient is not nervous/anxious.  pos for grief        Objective:   Physical Exam  Constitutional: She appears well-developed and well-nourished. No distress.  HENT:  Head: Normocephalic and atraumatic.  Right Ear: External ear normal.  Left Ear: External ear normal.  Mouth/Throat: Oropharynx is clear and moist.  Nares are injected and congested  No sinus tenderness Clear rhinorrhea and post nasal drip   Eyes: Conjunctivae and EOM are normal. Pupils are equal, round, and reactive to light. Right eye exhibits no discharge. Left eye exhibits no discharge.  Neck: Normal range of motion. Neck supple.  Cardiovascular: Normal rate and normal heart sounds.   Pulmonary/Chest: Effort normal and breath sounds normal. No respiratory distress. She has no wheezes. She has no rales. She exhibits no tenderness.  Musculoskeletal: She exhibits no edema.  Lymphadenopathy:    She has no cervical adenopathy.  Neurological: She is alert.  No tremor   Skin: Skin is warm and dry. No rash noted.  Psychiatric: She has a normal mood and affect.  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

## 2014-12-13 NOTE — Patient Instructions (Signed)
For your upper respiratory infection - I recommend guifenesin with or without DM is fine Drink lots of fluids and rest  If sinus symptoms return call and let me know If any trouble swallowing - let me know  Labs for thyroid today  If you feel you need counseling for grief please let me know

## 2014-12-13 NOTE — Assessment & Plan Note (Signed)
Check tsh today and cbc  Suspect it is more likely stress related

## 2014-12-14 LAB — CBC WITH DIFFERENTIAL/PLATELET
BASOS PCT: 0.3 % (ref 0.0–3.0)
Basophils Absolute: 0 10*3/uL (ref 0.0–0.1)
EOS ABS: 0.1 10*3/uL (ref 0.0–0.7)
EOS PCT: 1.3 % (ref 0.0–5.0)
HCT: 43 % (ref 36.0–46.0)
Hemoglobin: 14.8 g/dL (ref 12.0–15.0)
LYMPHS PCT: 22.1 % (ref 12.0–46.0)
Lymphs Abs: 2 10*3/uL (ref 0.7–4.0)
MCHC: 34.5 g/dL (ref 30.0–36.0)
MCV: 92.7 fl (ref 78.0–100.0)
MONO ABS: 0.4 10*3/uL (ref 0.1–1.0)
Monocytes Relative: 4.8 % (ref 3.0–12.0)
NEUTROS ABS: 6.5 10*3/uL (ref 1.4–7.7)
NEUTROS PCT: 71.5 % (ref 43.0–77.0)
PLATELETS: 311 10*3/uL (ref 150.0–400.0)
RBC: 4.64 Mil/uL (ref 3.87–5.11)
RDW: 12.8 % (ref 11.5–15.5)
WBC: 9.1 10*3/uL (ref 4.0–10.5)

## 2014-12-14 LAB — TSH: TSH: 1.47 u[IU]/mL (ref 0.35–4.50)

## 2014-12-15 ENCOUNTER — Encounter: Payer: Self-pay | Admitting: *Deleted

## 2015-05-18 ENCOUNTER — Other Ambulatory Visit: Payer: Self-pay | Admitting: Obstetrics and Gynecology

## 2015-05-23 LAB — CYTOLOGY - PAP

## 2015-07-08 ENCOUNTER — Encounter: Payer: Self-pay | Admitting: Internal Medicine

## 2015-07-08 ENCOUNTER — Ambulatory Visit (INDEPENDENT_AMBULATORY_CARE_PROVIDER_SITE_OTHER): Payer: BC Managed Care – PPO | Admitting: Internal Medicine

## 2015-07-08 VITALS — BP 122/76 | HR 82 | Temp 98.4°F | Wt 144.0 lb

## 2015-07-08 DIAGNOSIS — L600 Ingrowing nail: Secondary | ICD-10-CM

## 2015-07-08 DIAGNOSIS — B379 Candidiasis, unspecified: Secondary | ICD-10-CM

## 2015-07-08 DIAGNOSIS — T3695XA Adverse effect of unspecified systemic antibiotic, initial encounter: Secondary | ICD-10-CM

## 2015-07-08 MED ORDER — FLUCONAZOLE 150 MG PO TABS: 150.0000 mg | ORAL_TABLET | Freq: Once | ORAL | Status: DC

## 2015-07-08 MED ORDER — CEPHALEXIN 500 MG PO CAPS: 500.0000 mg | ORAL_CAPSULE | Freq: Three times a day (TID) | ORAL | Status: DC

## 2015-07-08 NOTE — Patient Instructions (Signed)
Ingrown Toenail  An ingrown toenail occurs when the corner or sides of your toenail grow into the surrounding skin. The big toe is most commonly affected, but it can happen to any of your toes. If your ingrown toenail is not treated, you will be at risk for infection.  CAUSES  This condition may be caused by:  · Wearing shoes that are too small or tight.  · Injury or trauma, such as stubbing your toe or having your toe stepped on.  · Improper cutting or care of your toenails.  · Being born with (congenital) nail or foot abnormalities, such as having a nail that is too big for your toe.  RISK FACTORS  Risk factors for an ingrown toenail include:  · Age. Your nails tend to thicken as you get older, so ingrown nails are more common in older people.  · Diabetes.  · Cutting your toenails incorrectly.  · Blood circulation problems.  SYMPTOMS  Symptoms may include:  · Pain, soreness, or tenderness.  · Redness.  · Swelling.  · Hardening of the skin surrounding the toe.  Your ingrown toenail may be infected if there is fluid, pus, or drainage.  DIAGNOSIS   An ingrown toenail may be diagnosed by medical history and physical exam. If your toenail is infected, your health care provider may test a sample of the drainage.  TREATMENT  Treatment depends on the severity of your ingrown toenail. Some ingrown toenails may be treated at home. More severe or infected ingrown toenails may require surgery to remove all or part of the nail. Infected ingrown toenails may also be treated with antibiotic medicines.  HOME CARE INSTRUCTIONS  · If you were prescribed an antibiotic medicine, finish all of it even if you start to feel better.  · Soak your foot in warm soapy water for 20 minutes, 3 times per day or as directed by your health care provider.  · Carefully lift the edge of the nail away from the sore skin by wedging a small piece of cotton under the corner of the nail. This may help with the pain.  Be careful not to cause more injury  to the area.  · Wear shoes that fit well. If your ingrown toenail is causing you pain, try wearing sandals, if possible.  · Trim your toenails regularly and carefully. Do not cut them in a curved shape. Cut your toenails straight across. This prevents injury to the skin at the corners of the toenail.  · Keep your feet clean and dry.  · If you are having trouble walking and are given crutches by your health care provider, use them as directed.  · Do not pick at your toenail or try to remove it yourself.  · Take medicines only as directed by your health care provider.  · Keep all follow-up visits as directed by your health care provider. This is important.  SEEK MEDICAL CARE IF:  · Your symptoms do not improve with treatment.  SEEK IMMEDIATE MEDICAL CARE IF:  · You have red streaks that start at your foot and go up your leg.  · You have a fever.  · You have increased redness, swelling, or pain.  · You have fluid, blood, or pus coming from your toenail.     This information is not intended to replace advice given to you by your health care provider. Make sure you discuss any questions you have with your health care provider.     Document Released:   08/03/2000 Document Revised: 12/21/2014 Document Reviewed: 06/30/2014  Elsevier Interactive Patient Education ©2016 [employer]

## 2015-07-08 NOTE — Progress Notes (Signed)
Pre visit review using our clinic review tool, if applicable. No additional management support is needed unless otherwise documented below in the visit note. 

## 2015-07-08 NOTE — Progress Notes (Signed)
Subjective:    Patient ID: Natalia Leatherwood, female    DOB: 1960-08-21, 54 y.o.   MRN: [id number]  HPI  Pt presents to the clinic today with c/o toe pain. This started 1 week ago. She describes the pain as soreness and throbbing. She has noticed redness and warmth. It seems worse on the left side of the right big toe. She thinks she may have an ingrown toenail. She has tried Neosporin with minimal relief. She does have a podiatry appt in December but does not feel like she could wait until then.  Review of Systems      Past Medical History  Diagnosis Date  . Pain in joint, site unspecified   . Unspecified gastritis and gastroduodenitis without mention of hemorrhage   . Headache(784.0)   . Symptomatic menopausal or female climacteric states   . Nonspecific elevation of levels of transaminase or lactic acid dehydrogenase (LDH)   . Allergic rhinitis, cause unspecified   . Unspecified sinusitis (chronic)   . Back pain     epidural injections  . Foot fracture     Current Outpatient Prescriptions  Medication Sig Dispense Refill  . b complex vitamins tablet Take 1 tablet by mouth daily.      . bimatoprost (LUMIGAN) 0.01 % SOLN Place 1 drop into both eyes at bedtime.      Marland Kitchen CALCIUM-MAGNESIUM-VITAMIN D PO Take 2 tablets by mouth daily.     . Cholecalciferol (VITAMIN D3) 5000 UNIT/ML LIQD Take by mouth daily.      . clidinium-chlordiazePOXIDE (LIBRAX) 2.5-5 MG per capsule Take 1 capsule by mouth every 4 (four) hours as needed.      Marland Kitchen estradiol (VIVELLE-DOT) 0.1 MG/24HR Place 1 patch onto the skin 2 (two) times a week.      . hydrochlorothiazide (HYDRODIURIL) 25 MG tablet Take 25 mg by mouth daily.    . Multiple Vitamin (MULTIVITAMIN) tablet Take 1 tablet by mouth daily.      . timolol (BETIMOL) 0.25 % ophthalmic solution Place 1-2 drops into both eyes 2 (two) times daily.     No current facility-administered medications for this visit.    Allergies  Allergen Reactions  . Augmentin  [Amoxicillin-Pot Clavulanate] Diarrhea  . Celecoxib     REACTION: rash  . Codeine     REACTION: rash  . Morphine     REACTION: u/k  . Propoxyphene N-Acetaminophen     REACTION: u/k  . Tramadol Hcl     REACTION: u/k    Family History  Problem Relation Age of Onset  . Other Father     disk disease  . Arthritis      paternal side  . Other Brother     disk disease    Social History   Social History  . Marital Status: Married    Spouse Name: N/A  . Number of Children: N/A  . Years of Education: N/A   Occupational History  . Not on file.   Social History Main Topics  . Smoking status: Never Smoker   . Smokeless tobacco: Not on file  . Alcohol Use: No  . Drug Use: No  . Sexual Activity: Not on file   Other Topics Concern  . Not on file   Social History Narrative   Non smoker      Has children     Constitutional: Denies fever, malaise, fatigue, headache or abrupt weight changes.  Musculoskeletal: Pt reports toe pain. Denies decrease in range of motion,  difficulty with gait, muscle pain.  Skin: Denies redness, rashes, lesions or ulcercations.    No other specific complaints in a complete review of systems (except as listed in HPI above).  Objective:   Physical Exam   BP 122/76 mmHg  Pulse 82  Temp(Src) 98.4 F (36.9 C) (Oral)  Wt 144 lb (65.318 kg)  SpO2 98%  LMP 08/20/1988 Wt Readings from Last 3 Encounters:  07/08/15 144 lb (65.318 kg)  12/13/14 140 lb 8 oz (63.73 kg)  10/29/14 144 lb 6.4 oz (65.499 kg)    General: Appears her stated age, well developed, well nourished in NAD. Skin: Warm, dry and intact. Redness and warmth noted of medial border of right great toe. Musculoskeletal: No joint swelling.  BMET    Component Value Date/Time   NA 143 12/21/2009 1619   K 4.1 12/21/2009 1619   CL 105 12/21/2009 1619   CO2 32 12/21/2009 1619   GLUCOSE 92 12/21/2009 1619   BUN 11 12/21/2009 1619   CREATININE 0.7 12/21/2009 1619   CALCIUM 9.7  12/21/2009 1619   GFRNONAA 90.81 12/21/2009 1619    Lipid Panel  No results found for: CHOL, TRIG, HDL, CHOLHDL, VLDL, LDLCALC  CBC    Component Value Date/Time   WBC 9.1 12/13/2014 1701   RBC 4.64 12/13/2014 1701   HGB 14.8 12/13/2014 1701   HCT 43.0 12/13/2014 1701   PLT 311.0 12/13/2014 1701   MCV 92.7 12/13/2014 1701   MCHC 34.5 12/13/2014 1701   RDW 12.8 12/13/2014 1701   LYMPHSABS 2.0 12/13/2014 1701   MONOABS 0.4 12/13/2014 1701   EOSABS 0.1 12/13/2014 1701   BASOSABS 0.0 12/13/2014 1701    Hgb A1C No results found for: HGBA1C      Assessment & Plan:   Infected ingrown toenail, right big toe;  Advised her to soak it in epsom salt BID Ibuprofen for pain eRx for Keflex 500 mg TID x 7 days Advised her to followup with podiatry if not improving eRx for Diflucan for antibiotic induced yeast infection  RTC as needed or if symptoms persist or worsen

## 2016-02-24 ENCOUNTER — Encounter: Payer: Self-pay | Admitting: Family Medicine

## 2016-02-24 ENCOUNTER — Ambulatory Visit (INDEPENDENT_AMBULATORY_CARE_PROVIDER_SITE_OTHER): Payer: BC Managed Care – PPO | Admitting: Family Medicine

## 2016-02-24 VITALS — BP 102/64 | HR 69 | Temp 98.5°F | Wt 148.5 lb

## 2016-02-24 DIAGNOSIS — W57XXXA Bitten or stung by nonvenomous insect and other nonvenomous arthropods, initial encounter: Secondary | ICD-10-CM | POA: Diagnosis not present

## 2016-02-24 DIAGNOSIS — T148 Other injury of unspecified body region: Secondary | ICD-10-CM | POA: Diagnosis not present

## 2016-02-24 MED ORDER — DOXYCYCLINE HYCLATE 100 MG PO TABS: 100.0000 mg | ORAL_TABLET | Freq: Two times a day (BID) | ORAL | Status: DC

## 2016-02-24 NOTE — Patient Instructions (Signed)
Keep the area clean and covered.  Start doxycycline.  Sun caution.  Take care.  Glad to see you.

## 2016-02-24 NOTE — Progress Notes (Signed)
Pre visit review using our clinic review tool, if applicable. No additional management support is needed unless otherwise documented below in the visit note.  Tick bite about 3 weeks ago.  Between L 3rd and 4th toe.  Removed with tweezers.  In the meantime, she developed a knot.  Now with itchy pustule, recently changed.   She feels well o/w.  No FCNAVD.  No other rash.    Meds, vitals, and allergies reviewed.   ROS: Per HPI unless specifically indicated in ROS section   nad ncat L foot wnl on inspection except for small <1cm pustule in 3rd web space, between the 3rd and 4th toe.  Normal dp pulse, no spreading erythema.

## 2016-02-25 DIAGNOSIS — W57XXXA Bitten or stung by nonvenomous insect and other nonvenomous arthropods, initial encounter: Secondary | ICD-10-CM | POA: Insufficient documentation

## 2016-02-25 NOTE — Assessment & Plan Note (Signed)
Now with apparent pustule at the site.  D/w pt.   Area cleaned with etoh and punctured superficially w/o complication with 25g needle.  Clear fluid drained.  It became apparent this wasn't a pustule but was a blister with whitish discoloration of the skin that made it appear like a pustule.   D/w pt.  Given the atypical course after the tick bite, and the need decompression (tolerated w/o complication), would start doxy.  Local care o/w and f/u prn.  She agrees.

## 2016-06-08 ENCOUNTER — Telehealth: Payer: Self-pay | Admitting: Family Medicine

## 2016-06-08 NOTE — Telephone Encounter (Signed)
Pt called to see if she could get a tentus with diptheria and flu shot.  she would like to get this 10/27 after 3:30 is it ok to schedule.

## 2016-06-08 NOTE — Telephone Encounter (Signed)
That is fine with me.

## 2016-06-08 NOTE — Telephone Encounter (Signed)
shapale since we don't have nurse visit on Friday can you give vaccine

## 2016-06-08 NOTE — Telephone Encounter (Signed)
I'm leaving at 3pm on that day, sorry

## 2016-06-12 NOTE — Telephone Encounter (Signed)
Linda-did you get someone to do the injection for Grace [name]?

## 2016-06-15 ENCOUNTER — Ambulatory Visit (INDEPENDENT_AMBULATORY_CARE_PROVIDER_SITE_OTHER): Payer: BC Managed Care – PPO | Admitting: *Deleted

## 2016-06-15 DIAGNOSIS — Z23 Encounter for immunization: Secondary | ICD-10-CM | POA: Diagnosis not present

## 2016-08-15 ENCOUNTER — Other Ambulatory Visit: Payer: Self-pay | Admitting: Obstetrics & Gynecology

## 2016-08-15 DIAGNOSIS — R928 Other abnormal and inconclusive findings on diagnostic imaging of breast: Secondary | ICD-10-CM

## 2016-08-24 ENCOUNTER — Other Ambulatory Visit: Payer: BC Managed Care – PPO

## 2016-08-24 ENCOUNTER — Ambulatory Visit
Admission: RE | Admit: 2016-08-24 | Discharge: 2016-08-24 | Disposition: A | Discharge status: Home / Self Care | Payer: BC Managed Care – PPO | Source: Ambulatory Visit | Attending: Obstetrics & Gynecology | Admitting: Obstetrics & Gynecology

## 2016-08-24 DIAGNOSIS — R928 Other abnormal and inconclusive findings on diagnostic imaging of breast: Secondary | ICD-10-CM

## 2016-08-24 IMAGING — US ULTRASOUND LEFT BREAST LIMITED
1 series · 13 of 20 positions shown · non-contrast
Comparison: [DATE]

CLINICAL DATA: Possible mass left breast identified on recent
screening mammogram with tomography. The possible masses in the far
outer left breast.

EXAM:
2D DIGITAL DIAGNOSTIC LEFT MAMMOGRAM WITH CAD AND ADJUNCT TOMO
ULTRASOUND LEFT BREAST

[Series 1: ultrasound left breast limited · 0.06mm/px · 13 of 20 slices shown]
[im 1/20]
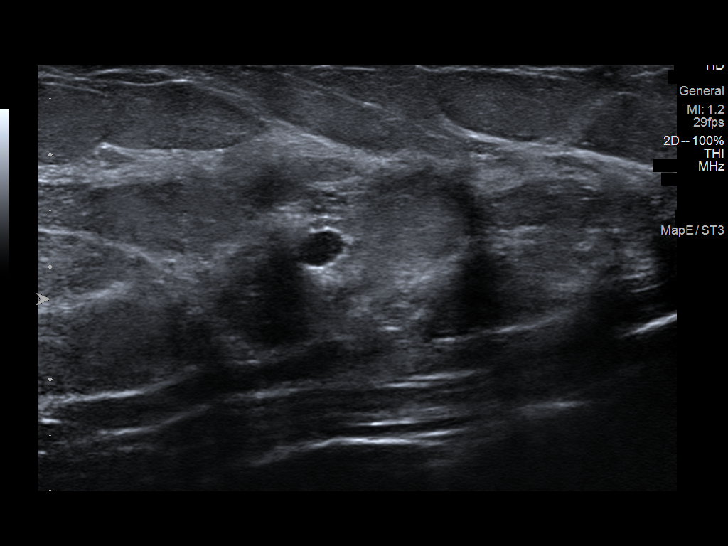
[im 3/20]
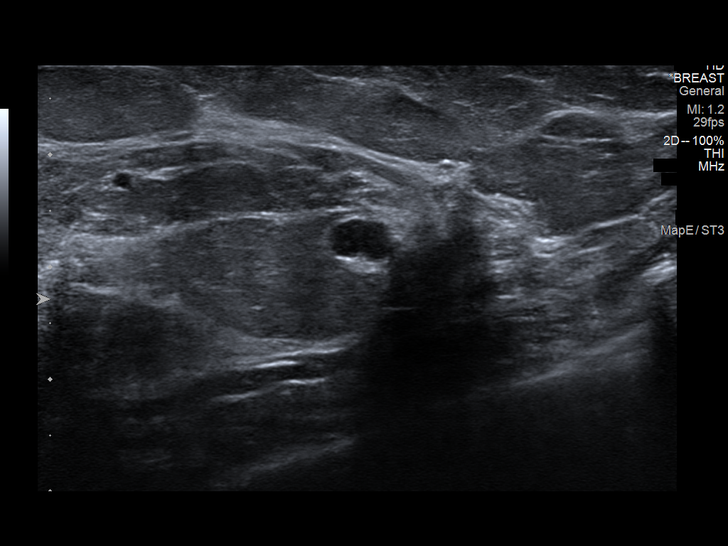
[im 4/20]
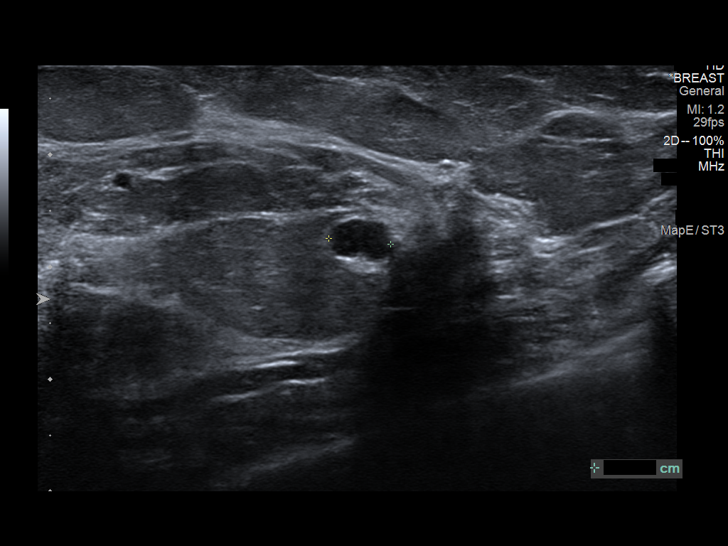
[im 6/20]
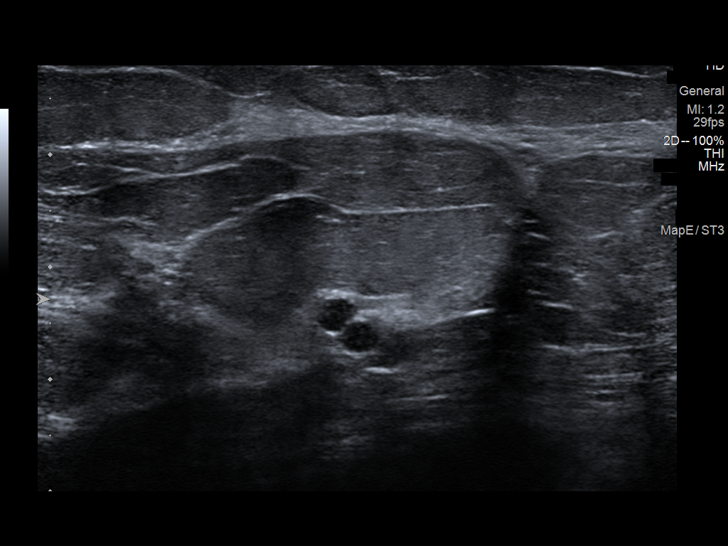
[im 7/20]
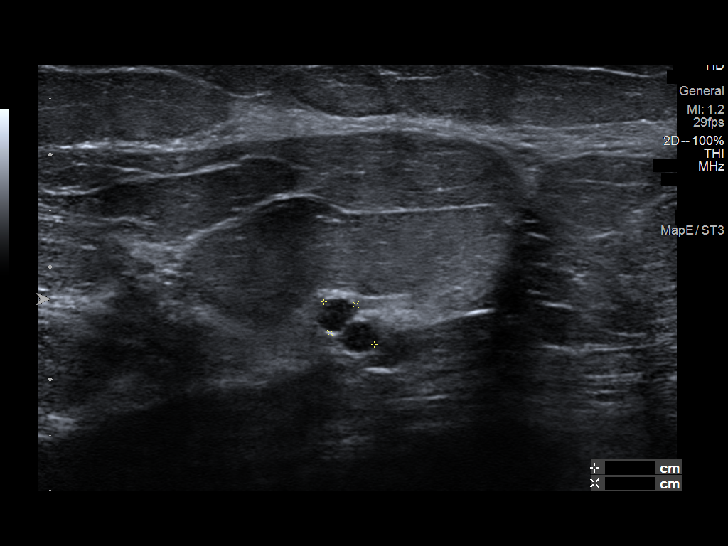
[im 9/20]
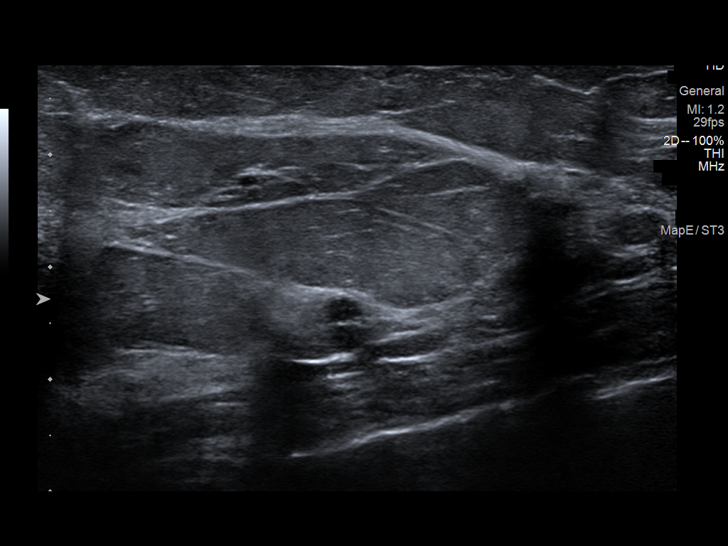
[im 11/20]
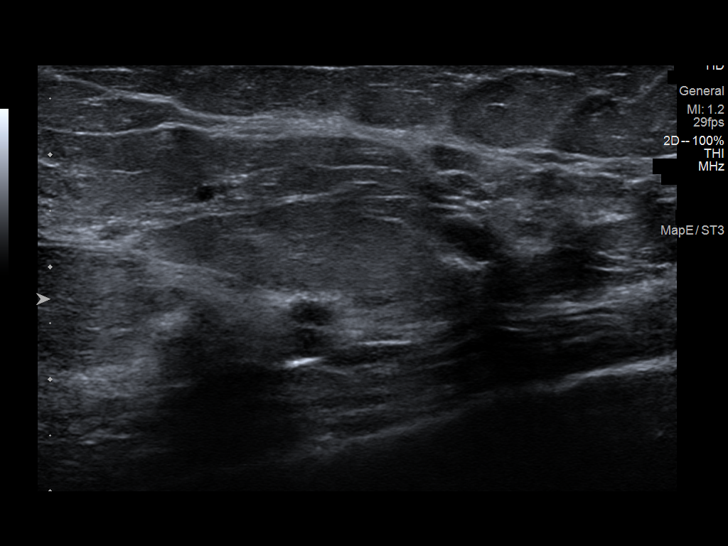
[im 12/20]
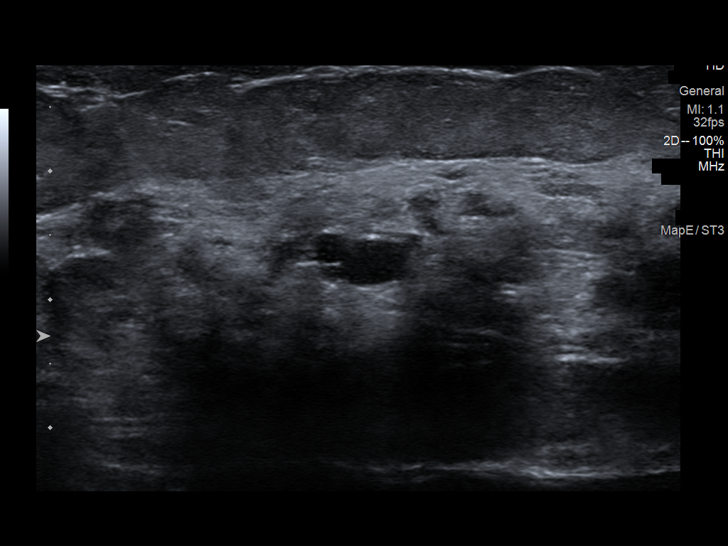
[im 14/20]
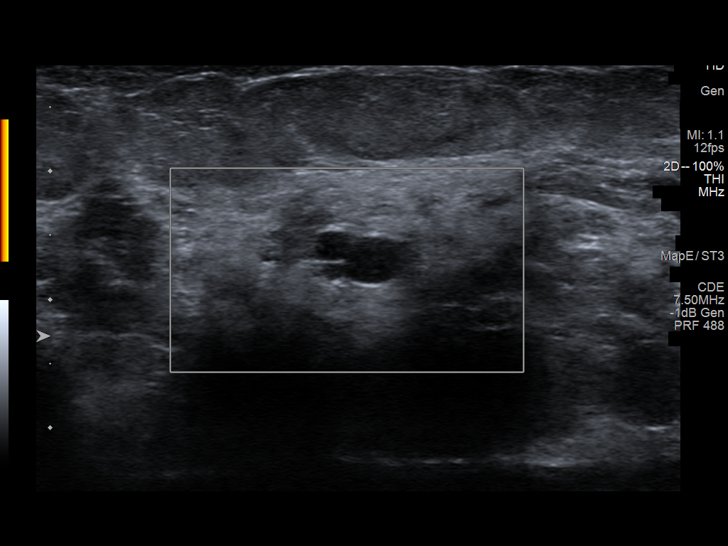
[im 15/20]
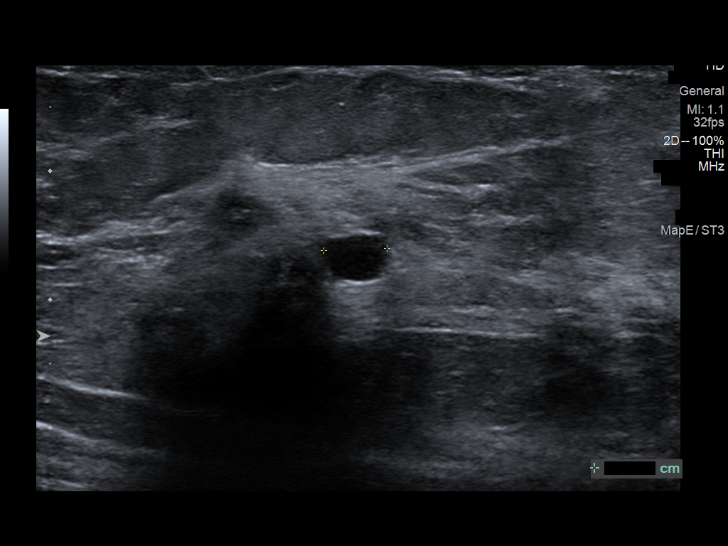
[im 17/20]
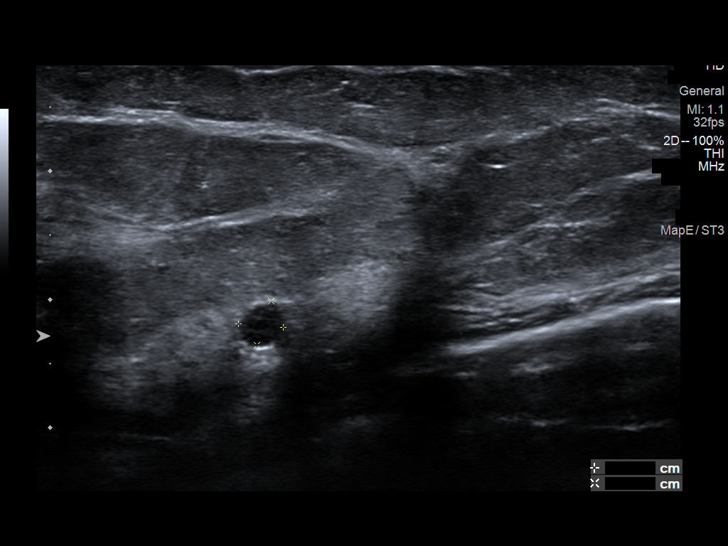
[im 18/20]
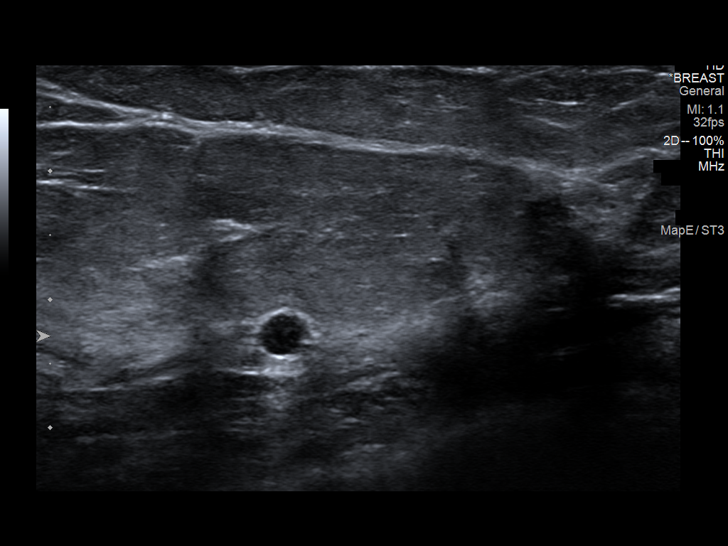
[im 20/20]
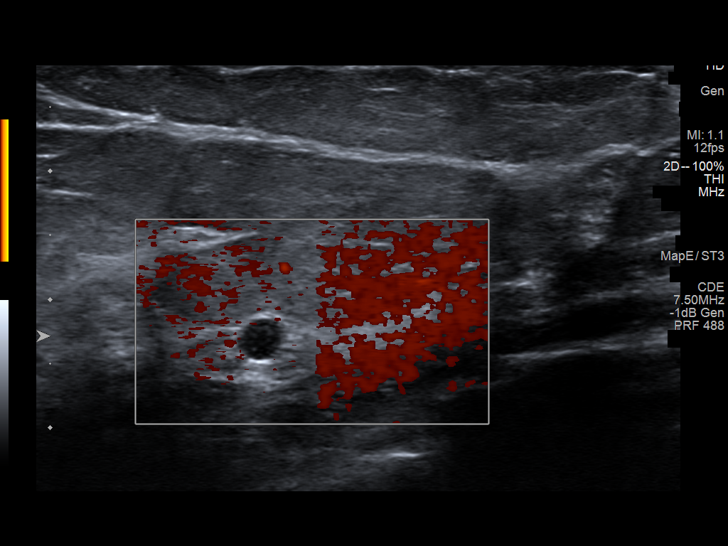

[13 of 20 positions shown; findings below may reference images not displayed]

ACR Breast Density Category c: The breast tissue is heterogeneously
dense, which may obscure small masses.
FINDINGS: In the posterior third of the outer left breast is an approximately
4 mm circumscribed nodule.

Mammographic images were processed with CAD.

On physical exam, no mass is palpated in the upper outer or lower
outer quadrants of the left breast

Targeted ultrasound is performed, showing scattered benign cysts in
the upper-outer and lower-outer quadrants of the left breast. In the
4 o'clock position of the left breast 7 cm from the nipple adjacent
to the pectoralis muscle is a 4 x 4 x 4 mm cyst that is felt to
account for the mass seen on the recent screening mammogram.
Additional cysts in the outer left breast include a 7 x 4 x 5 mm
cyst at 1 o'clock position 4 cm from the nipple and two adjacent
cysts at [DATE] position 5 cm from the nipple measuring 6 x 3 x 4 mm.
At [DATE] position 6 cm from the nipple is a cyst measuring 6 x 4 x 3
mm.

No solid or suspicious mass or abnormal shadowing is identified in
the outer left breast.
IMPRESSION: No evidence of malignancy in the left breast. There are several
scattered cysts. A 4 mm cyst in the 4 o'clock position of the left
breast is likely the cyst that was identified on the recent
screening mammogram.

RECOMMENDATION:
Screening mammogram in one year.(Code:[E1])

I have discussed the findings and recommendations with the patient.
Results were also provided in writing at the conclusion of the
visit. If applicable, a reminder letter will be sent to the patient
regarding the next appointment.

BI-RADS CATEGORY  2: Benign.

## 2016-08-24 IMAGING — MG 2D DIGITAL DIAGNOSTIC UNILATERAL LEFT MAMMOGRAM WITH CAD AND ADJ
3 series · 3 of 7 positions shown · non-contrast
Comparison: [DATE]

CLINICAL DATA: Possible mass left breast identified on recent
screening mammogram with tomography. The possible masses in the far
outer left breast.

EXAM:
2D DIGITAL DIAGNOSTIC LEFT MAMMOGRAM WITH CAD AND ADJUNCT TOMO
ULTRASOUND LEFT BREAST

[L CC]
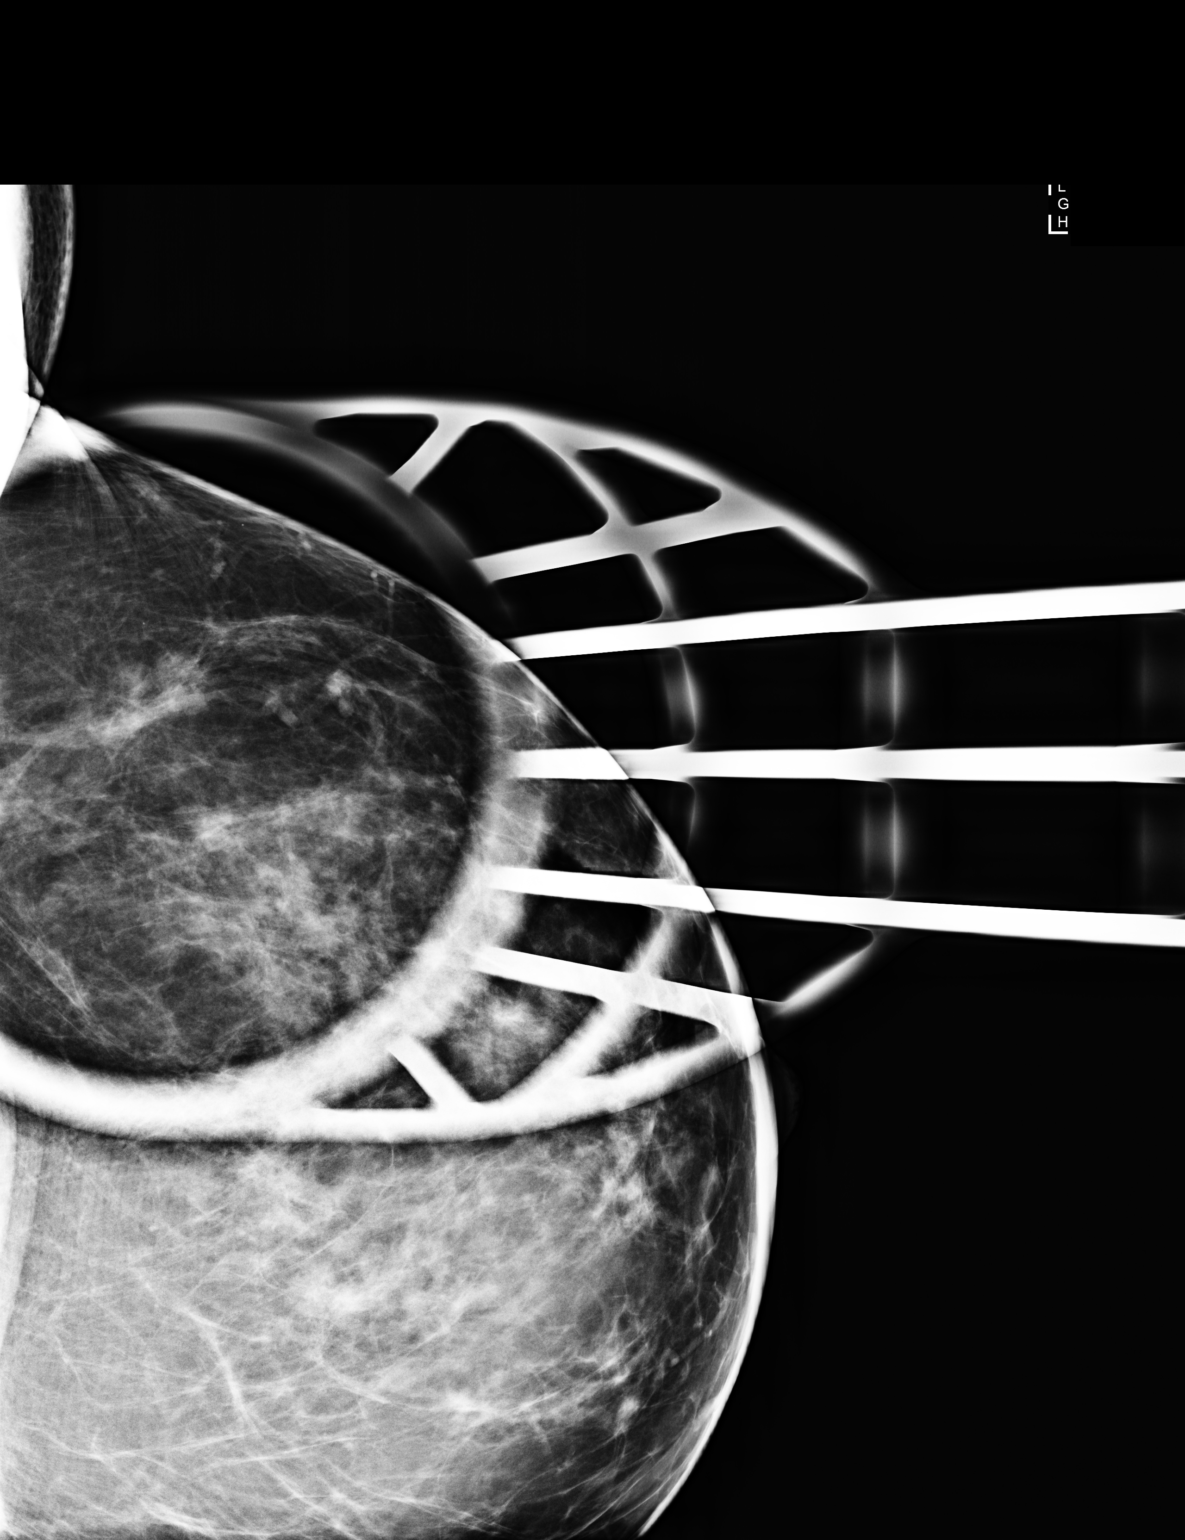

[L CC synth-2D]
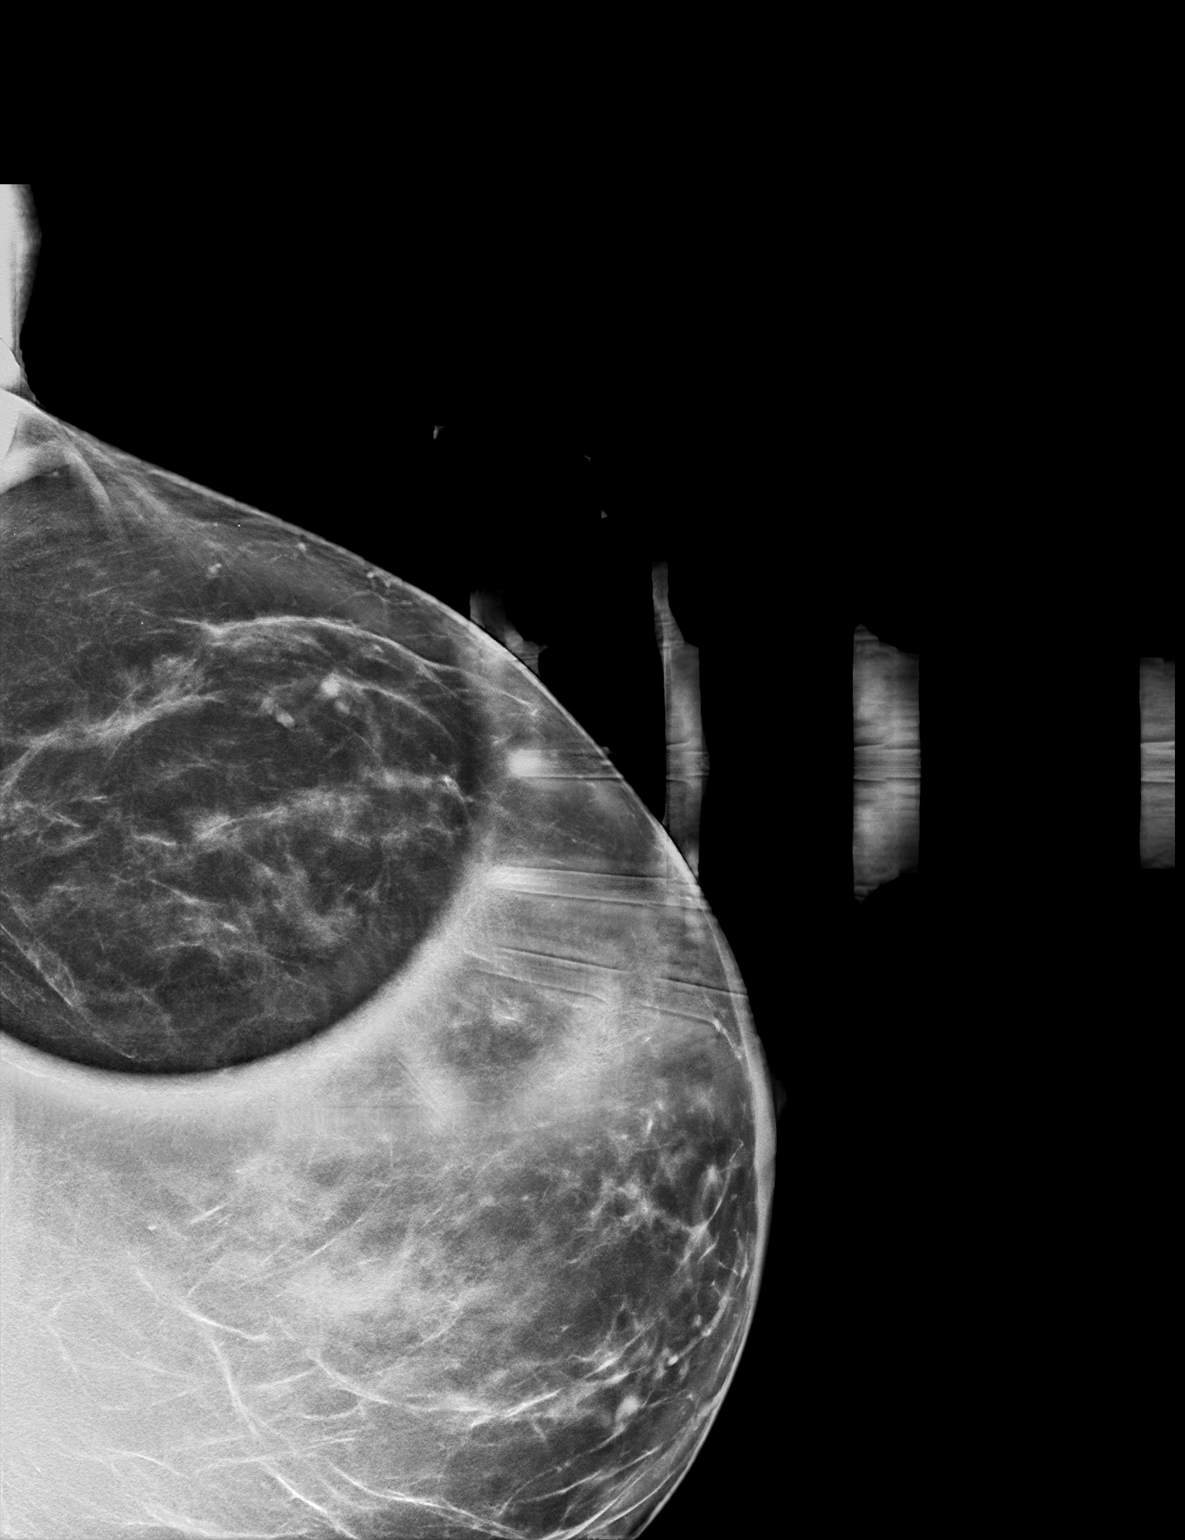

[L CC tomo · tomo slice 41/80.0]
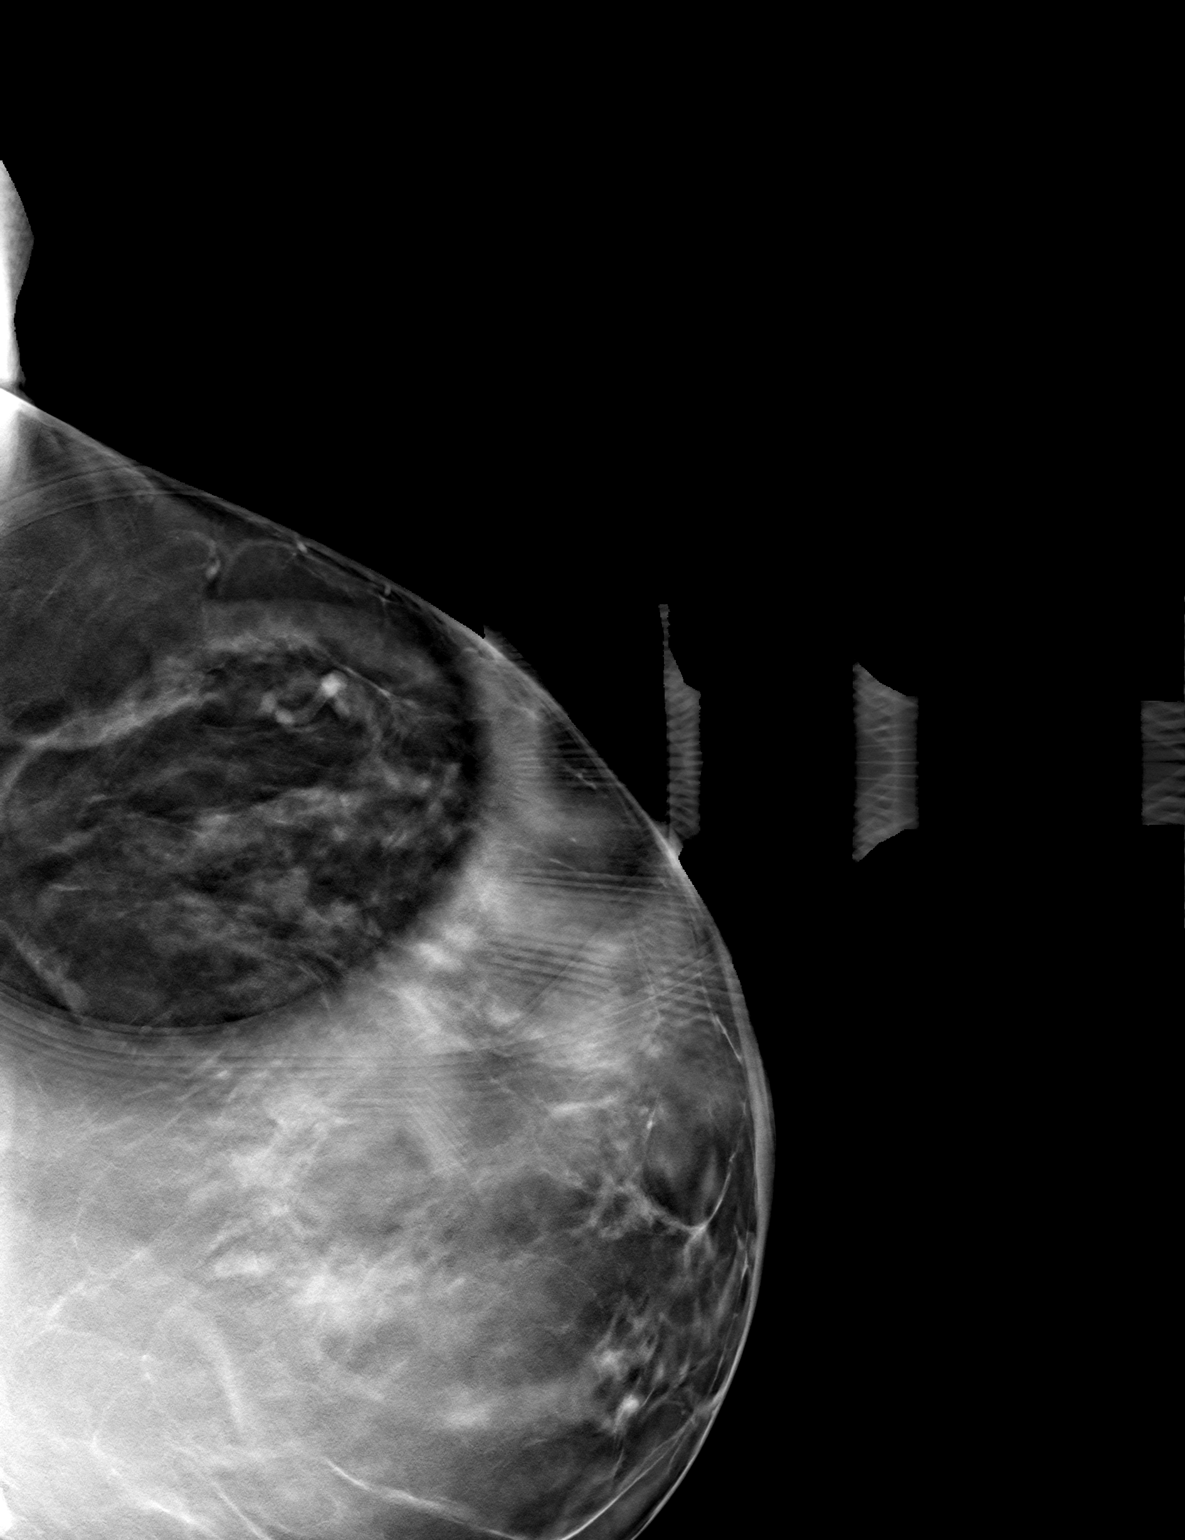

[3 of 7 positions shown; findings below may reference images not displayed]

ACR Breast Density Category c: The breast tissue is heterogeneously
dense, which may obscure small masses.
FINDINGS: In the posterior third of the outer left breast is an approximately
4 mm circumscribed nodule.

Mammographic images were processed with CAD.

On physical exam, no mass is palpated in the upper outer or lower
outer quadrants of the left breast

Targeted ultrasound is performed, showing scattered benign cysts in
the upper-outer and lower-outer quadrants of the left breast. In the
4 o'clock position of the left breast 7 cm from the nipple adjacent
to the pectoralis muscle is a 4 x 4 x 4 mm cyst that is felt to
account for the mass seen on the recent screening mammogram.
Additional cysts in the outer left breast include a 7 x 4 x 5 mm
cyst at 1 o'clock position 4 cm from the nipple and two adjacent
cysts at [DATE] position 5 cm from the nipple measuring 6 x 3 x 4 mm.
At [DATE] position 6 cm from the nipple is a cyst measuring 6 x 4 x 3
mm.

No solid or suspicious mass or abnormal shadowing is identified in
the outer left breast.
IMPRESSION: No evidence of malignancy in the left breast. There are several
scattered cysts. A 4 mm cyst in the 4 o'clock position of the left
breast is likely the cyst that was identified on the recent
screening mammogram.

RECOMMENDATION:
Screening mammogram in one year.(Code:[E1])

I have discussed the findings and recommendations with the patient.
Results were also provided in writing at the conclusion of the
visit. If applicable, a reminder letter will be sent to the patient
regarding the next appointment.

BI-RADS CATEGORY  2: Benign.

## 2017-05-02 ENCOUNTER — Ambulatory Visit (INDEPENDENT_AMBULATORY_CARE_PROVIDER_SITE_OTHER): Payer: BC Managed Care – PPO | Admitting: Internal Medicine

## 2017-05-02 ENCOUNTER — Encounter: Payer: Self-pay | Admitting: Internal Medicine

## 2017-05-02 VITALS — BP 120/82 | HR 71 | Temp 98.2°F | Wt 143.0 lb

## 2017-05-02 DIAGNOSIS — J0100 Acute maxillary sinusitis, unspecified: Secondary | ICD-10-CM

## 2017-05-02 DIAGNOSIS — B379 Candidiasis, unspecified: Secondary | ICD-10-CM | POA: Diagnosis not present

## 2017-05-02 DIAGNOSIS — T3695XA Adverse effect of unspecified systemic antibiotic, initial encounter: Secondary | ICD-10-CM

## 2017-05-02 MED ORDER — FLUCONAZOLE 150 MG PO TABS: 150.0000 mg | ORAL_TABLET | Freq: Once | ORAL | 0 refills | Status: AC

## 2017-05-02 MED ORDER — PROMETHAZINE-DM 6.25-15 MG/5ML PO SYRP: 5.0000 mL | ORAL_SOLUTION | Freq: Four times a day (QID) | ORAL | 0 refills | Status: DC | PRN

## 2017-05-02 MED ORDER — AMOXICILLIN 875 MG PO TABS: 875.0000 mg | ORAL_TABLET | Freq: Two times a day (BID) | ORAL | 0 refills | Status: DC

## 2017-05-02 NOTE — Progress Notes (Signed)
HPI  Pt presents to the clinic today with c/o headache, facial pain and pressure, nasal congestion, sore throat and cough. She reports this started 4 days ago. She is blowing yellow/green mucous out of her nose. She denies difficulty swallowing. The cough is productive of brown mucous. She denies fever but has had chills and body aches. She has tried Flonase, and Advil Cold and Sinus with minimal relief. She has not had sick contacts that she is aware of. She does have a history of allergies and chronic sinusitis.   Review of Systems     Past Medical History:  Diagnosis Date  . Allergic rhinitis, cause unspecified   . Back pain    epidural injections  . Foot fracture   . Headache(784.0)   . Nonspecific elevation of levels of transaminase or lactic acid dehydrogenase (LDH)   . Pain in joint, site unspecified   . Symptomatic menopausal or female climacteric states   . Unspecified gastritis and gastroduodenitis without mention of hemorrhage   . Unspecified sinusitis (chronic)     Family History  Problem Relation Age of Onset  . Other Father        disk disease  . Arthritis Unknown        paternal side  . Other Brother        disk disease    Social History   Social History  . Marital status: Married    Spouse name: N/A  . Number of children: N/A  . Years of education: N/A   Occupational History  . Not on file.   Social History Main Topics  . Smoking status: Never Smoker  . Smokeless tobacco: Not on file  . Alcohol use No  . Drug use: No  . Sexual activity: Not on file   Other Topics Concern  . Not on file   Social History Narrative   Non smoker      Has children    Allergies  Allergen Reactions  . Augmentin [Amoxicillin-Pot Clavulanate] Diarrhea  . Celecoxib     REACTION: rash  . Codeine     REACTION: rash  . Morphine     REACTION: u/k  . Propoxyphene N-Acetaminophen     REACTION: u/k  . Tramadol Hcl     REACTION: u/k  . Sulfa Antibiotics Rash      Constitutional: Positive headache. Denies fatigue, fever or abrupt weight changes.  HEENT:  Positive facial pain, nasal congestion and sore throat. Denies eye redness, ear pain, ringing in the ears, wax buildup, runny nose or bloody nose. Respiratory: Positive cough. Denies difficulty breathing or shortness of breath.  Cardiovascular: Denies chest pain, chest tightness, palpitations or swelling in the hands or feet.   No other specific complaints in a complete review of systems (except as listed in HPI above).  Objective:   BP 120/82   Pulse 71   Temp 98.2 F (36.8 C) (Oral)   Wt 143 lb (64.9 kg)   LMP 08/20/1988   SpO2 98%   BMI 24.55 kg/m   General: Appears her stated age, ill appearing, in NAD. HEENT: Head: normal shape and size, maxillary sinus tenderness noted;  Ears: Tm's red and intact, normal light reflex; Nose: mucosa boggy and moist, turbinates swollen; Throat/Mouth: + PND. Teeth present, mucosa erythematous and moist, no exudate noted, no lesions or ulcerations noted.  Neck:  No adenopathy noted.  Pulmonary/Chest: Normal effort and positive vesicular breath sounds. No respiratory distress. No wheezes, rales or ronchi noted.  Assessment & Plan:   Acute bacterial sinusitis  Can use a Neti Pot which can be purchased from your local drug store. Flonase 2 sprays each nostril for 3 days and then as needed. eRx for Amoxil 875 mg BID for 10 days eRx for Diflucan for antibiotic induced yeast infection eRx for Dextromethorphan Promethazine cough syrup  RTC as needed or if symptoms persist. Nicki Reaper, NP

## 2017-05-02 NOTE — Patient Instructions (Signed)

## 2017-05-15 ENCOUNTER — Telehealth: Payer: Self-pay

## 2017-05-15 MED ORDER — PROMETHAZINE-DM 6.25-15 MG/5ML PO SYRP: 5.0000 mL | ORAL_SOLUTION | Freq: Four times a day (QID) | ORAL | 0 refills | Status: DC | PRN

## 2017-05-15 NOTE — Telephone Encounter (Signed)
Left message on voicemail.

## 2017-05-15 NOTE — Telephone Encounter (Signed)
Burning of what, her vagina? [facility]?

## 2017-05-15 NOTE — Telephone Encounter (Signed)
Pt left v/m; pt was seen 05/02/17. Pt has finished abx and infection has cleared up; still coughing at night; drinking a lot of water and cough drops during the day. Pt request refill promethazine DM for non prod and irritating cough which is worse at night. Pt request cb.

## 2017-05-15 NOTE — Telephone Encounter (Signed)
PT called to ck on status of refill. She is having some burning, she took fluc pill Mon. Is this normal? W[facility]s [facility]

## 2017-05-16 ENCOUNTER — Other Ambulatory Visit: Payer: Self-pay | Admitting: Internal Medicine

## 2017-05-16 MED ORDER — FLUCONAZOLE 150 MG PO TABS: 150.0000 mg | ORAL_TABLET | Freq: Once | ORAL | 0 refills | Status: AC

## 2017-05-16 NOTE — Telephone Encounter (Signed)
We can try another Diflucan, I will send in. If no improvement, she needs to make an appt to be evaluated.

## 2017-05-16 NOTE — Telephone Encounter (Signed)
Pt reports she has had minimal improvement since taking Diflucan... Pt reports minimal discharge... Denies itching more of "burning" feeling outside vaginal area not internal and denies urinary Sx... Pt wanted to know if she should try another diflucan or what does she need to do... If Rx is appropriate, [facility] [place]

## 2017-05-31 ENCOUNTER — Ambulatory Visit (INDEPENDENT_AMBULATORY_CARE_PROVIDER_SITE_OTHER): Payer: BC Managed Care – PPO | Admitting: Family Medicine

## 2017-05-31 ENCOUNTER — Encounter: Payer: Self-pay | Admitting: Family Medicine

## 2017-05-31 VITALS — BP 114/66 | HR 82 | Temp 97.7°F | Wt 144.0 lb

## 2017-05-31 DIAGNOSIS — R05 Cough: Secondary | ICD-10-CM

## 2017-05-31 DIAGNOSIS — R058 Other specified cough: Secondary | ICD-10-CM

## 2017-05-31 MED ORDER — PROMETHAZINE-DM 6.25-15 MG/5ML PO SYRP: 5.0000 mL | ORAL_SOLUTION | Freq: Four times a day (QID) | ORAL | 0 refills | Status: DC | PRN

## 2017-05-31 MED ORDER — ALBUTEROL SULFATE HFA 108 (90 BASE) MCG/ACT IN AERS: 2.0000 | INHALATION_SPRAY | RESPIRATORY_TRACT | 1 refills | Status: DC | PRN

## 2017-05-31 NOTE — Addendum Note (Signed)
Addended by: Desmond Dike on: 05/31/2017 03:22 PM   Modules accepted: Orders

## 2017-05-31 NOTE — Patient Instructions (Signed)
Cough, Adult Coughing is a reflex that clears your throat and your airways. Coughing helps to heal and protect your lungs. It is normal to cough occasionally, but a cough that happens with other symptoms or lasts a long time may be a sign of a condition that needs treatment. A cough may last only 2-3 weeks (acute), or it may last longer than 8 weeks (chronic). What are the causes? Coughing is commonly caused by:  Breathing in substances that irritate your lungs.  A viral or bacterial respiratory infection.  Allergies.  Asthma.  Postnasal drip.  Smoking.  Acid backing up from the stomach into the esophagus (gastroesophageal reflux).  Certain medicines.  Chronic lung problems, including COPD (or rarely, lung cancer).  Other medical conditions such as heart failure.  Follow these instructions at home: Pay attention to any changes in your symptoms. Take these actions to help with your discomfort:  Take medicines only as told by your health care provider. ? If you were prescribed an antibiotic medicine, take it as told by your health care provider. Do not stop taking the antibiotic even if you start to feel better. ? Talk with your health care provider before you take a cough suppressant medicine.  Drink enough fluid to keep your urine clear or pale yellow.  If the air is dry, use a cold steam vaporizer or humidifier in your bedroom or your home to help loosen secretions.  Avoid anything that causes you to cough at work or at home.  If your cough is worse at night, try sleeping in a semi-upright position.  Avoid cigarette smoke. If you smoke, quit smoking. If you need help quitting, ask your health care provider.  Avoid caffeine.  Avoid alcohol.  Rest as needed.  Contact a health care provider if:  You have new symptoms.  You cough up pus.  Your cough does not get better after 2-3 weeks, or your cough gets worse.  You cannot control your cough with suppressant  medicines and you are losing sleep.  You develop pain that is getting worse or pain that is not controlled with pain medicines.  You have a fever.  You have unexplained weight loss.  You have night sweats. Get help right away if:  You cough up blood.  You have difficulty breathing.  Your heartbeat is very fast. This information is not intended to replace advice given to you by your health care provider. Make sure you discuss any questions you have with your health care provider. Document Released: 02/02/2011 Document Revised: 01/12/2016 Document Reviewed: 10/13/2014 Elsevier Interactive Patient Education  2017 Elsevier Inc.  

## 2017-05-31 NOTE — Progress Notes (Signed)
   Subjective:    Patient ID: Grace [name], female    DOB: Jun 12, 1961, 56 y.o.   MRN: [id number]  HPI This is a 56 yo female who presents today with cough. She was seen 05/02/17 and treated with antibiotics. Symptoms other than cough has resolved. Had stopped Zyrtec but restarted. Feels "funny" at top of her chest. Little sputum, cough cyclic. Using cough syrup at bedtime. Some fatigue.   Past Medical History:  Diagnosis Date  . Allergic rhinitis, cause unspecified   . Back pain    epidural injections  . Foot fracture   . Headache(784.0)   . Nonspecific elevation of levels of transaminase or lactic acid dehydrogenase (LDH)   . Pain in joint, site unspecified   . Symptomatic menopausal or female climacteric states   . Unspecified gastritis and gastroduodenitis without mention of hemorrhage   . Unspecified sinusitis (chronic)    Past Surgical History:  Procedure Laterality Date  . CCY  2006  . COLONOSCOPY    . ESOPHAGOGASTRODUODENOSCOPY  2006   gastritis  . FOOT FRACTURE SURGERY  2010   Family History  Problem Relation Age of Onset  . Other Father        disk disease  . Arthritis Unknown        paternal side  . Other Brother        disk disease   Social History  Substance Use Topics  . Smoking status: Never Smoker  . Smokeless tobacco: Never Used  . Alcohol use No      Review of Systems Per HPI    Objective:   Physical Exam  Constitutional: She is oriented to person, place, and time. She appears well-developed and well-nourished.  HENT:  Head: Normocephalic and atraumatic.  Right Ear: External ear normal.  Left Ear: External ear normal.  Mouth/Throat: Oropharynx is clear and moist.  Eyes: Conjunctivae are normal.  Cardiovascular: Normal rate, regular rhythm and normal heart sounds.   Pulmonary/Chest: Effort normal and breath sounds normal. No respiratory distress. She has no wheezes. She has no rales.  Neurological: She is alert and oriented to person, place,  and time.  Skin: Skin is warm and dry.  Psychiatric: She has a normal mood and affect. Her behavior is normal. Judgment and thought content normal.  Vitals reviewed.     BP 114/66   Pulse 82   Temp 97.7 F (36.5 C) (Oral)   Wt 144 lb (65.3 kg)   LMP 08/20/1988   SpO2 97%   BMI 24.72 kg/m  Wt Readings from Last 3 Encounters:  05/31/17 144 lb (65.3 kg)  05/02/17 143 lb (64.9 kg)  02/24/16 148 lb 8 oz (67.4 kg)       Assessment & Plan:  1. Post-viral cough syndrome - Provided written and verbal information regarding diagnosis and treatment. - RTC precautions reviewed - albuterol (PROVENTIL HFA;VENTOLIN HFA) 108 (90 Base) MCG/ACT inhaler; Inhale 2 puffs into the lungs every 4 (four) hours as needed for wheezing or shortness of breath (cough, shortness of breath or wheezing.).  Dispense: 1 Inhaler; Refill: 1 - promethazine-dextromethorphan (PROMETHAZINE-DM) 6.25-15 MG/5ML syrup; Take 5 mLs by mouth 4 (four) times daily as needed for cough.  Dispense: 120 mL; Refill: 0   Olean Ree, FNP-BC  [facility], [facility]  05/31/2017 2:23 PM

## 2017-05-31 NOTE — Addendum Note (Signed)
Addended by: Desmond Dike on: 05/31/2017 02:59 PM   Modules accepted: Orders

## 2017-10-24 ENCOUNTER — Ambulatory Visit (INDEPENDENT_AMBULATORY_CARE_PROVIDER_SITE_OTHER): Payer: BC Managed Care – PPO | Admitting: Internal Medicine

## 2017-10-24 ENCOUNTER — Encounter: Payer: Self-pay | Admitting: Internal Medicine

## 2017-10-24 DIAGNOSIS — R21 Rash and other nonspecific skin eruption: Secondary | ICD-10-CM

## 2017-10-24 MED ORDER — VALACYCLOVIR HCL 1 G PO TABS: 1000.0000 mg | ORAL_TABLET | Freq: Three times a day (TID) | ORAL | 0 refills | Status: DC

## 2017-10-24 NOTE — Patient Instructions (Signed)
We have sent in the valtrex to take 1 pill three times per day for 1 week.    Shingles Shingles is an infection that causes a painful skin rash and fluid-filled blisters. Shingles is caused by the same virus that causes chickenpox. Shingles only develops in people who:  Have had chickenpox.  Have gotten the chickenpox vaccine. (This is rare.)  The first symptoms of shingles may be itching, tingling, or pain in an area on your skin. A rash will follow in a few days or weeks. The rash is usually on one side of the body in a bandlike or beltlike pattern. Over time, the rash turns into fluid-filled blisters that break open, scab over, and dry up. Medicines may:  Help you manage pain.  Help you recover more quickly.  Help to prevent long-term problems.  Follow these instructions at home: Medicines  Take medicines only as told by your doctor.  Apply an anti-itch or numbing cream to the affected area as told by your doctor. Blister and Rash Care  Take a cool bath or put cool compresses on the area of the rash or blisters as told by your doctor. This may help with pain and itching.  Keep your rash covered with a loose bandage (dressing). Wear loose-fitting clothing.  Keep your rash and blisters clean with mild soap and cool water or as told by your doctor.  Check your rash every day for signs of infection. These include redness, swelling, and pain that lasts or gets worse.  Do not pick your blisters.  Do not scratch your rash. General instructions  Rest as told by your doctor.  Keep all follow-up visits as told by your doctor. This is important.  Until your blisters scab over, your infection can cause chickenpox in people who have never had it or been vaccinated against it. To prevent this from happening, avoid touching other people or being around other people, especially: ? Babies. ? Pregnant women. ? Children who have eczema. ? Elderly people who have transplants. ? People  who have chronic illnesses, such as leukemia or AIDS. Contact a doctor if:  Your pain does not get better with medicine.  Your pain does not get better after the rash heals.  Your rash looks infected. Signs of infection include: ? Redness. ? Swelling. ? Pain that lasts or gets worse. Get help right away if:  The rash is on your face or nose.  You have pain in your face, pain around your eye area, or loss of feeling on one side of your face.  You have ear pain or you have ringing in your ear.  You have loss of taste.  Your condition gets worse. This information is not intended to replace advice given to you by your health care provider. Make sure you discuss any questions you have with your health care provider. Document Released: 01/23/2008 Document Revised: 04/01/2016 Document Reviewed: 05/18/2014 Elsevier Interactive Patient Education  [employer]2018 Elsevier Inc.

## 2017-10-24 NOTE — Progress Notes (Signed)
   Subjective:    Patient ID: Natalia LeatherwoodWanda I Koerber, female    DOB: 1961/04/24, 57 y.o.   MRN: [id number]  HPI The patient is a 57 YO female coming in for rash on her chest. Started about 1 day ago. It is sore and some burning. Overall she describes as mild. She is taking otc medication for it. Sensitive to clothes as well. She thinks it is spreading some. Denies fevers or chills. No known sick contacts. This is worsening.   Review of Systems  Constitutional: Negative.   Respiratory: Negative for cough, chest tightness and shortness of breath.   Cardiovascular: Negative for chest pain, palpitations and leg swelling.  Gastrointestinal: Negative for abdominal distention, abdominal pain, constipation, diarrhea, nausea and vomiting.  Musculoskeletal: Negative.   Skin: Positive for rash.  Neurological: Negative.   Psychiatric/Behavioral: Negative.       Objective:   Physical Exam  Constitutional: She is oriented to person, place, and time. She appears well-developed and well-nourished.  HENT:  Head: Normocephalic and atraumatic.  Eyes: EOM are normal.  Neck: Normal range of motion.  Cardiovascular: Normal rate and regular rhythm.  Pulmonary/Chest: Effort normal and breath sounds normal. No respiratory distress. She has no wheezes. She has no rales.  Abdominal: Soft.  Musculoskeletal: She exhibits no edema.  Neurological: She is alert and oriented to person, place, and time. Coordination normal.  Skin: Skin is warm and dry. Rash noted.  Some macular rash on the upper right chest and left upper chest which is sensitive to the touch.    Vitals:   10/24/17 1440  BP: 120/78  Pulse: 76  Temp: 98.4 F (36.9 C)  TempSrc: Oral  SpO2: 99%  Weight: 150 lb (68 kg)  Height: 5\' 4"  (1.626 m)      Assessment & Plan:

## 2017-10-25 ENCOUNTER — Ambulatory Visit: Payer: Self-pay | Admitting: *Deleted

## 2017-10-25 NOTE — Telephone Encounter (Signed)
Pt    Was   Seen  By  Dr  Okey Duprerawford   yest  For  Shingles  On  Left  Side  Chest   Rx  Valtrex  Got  It  Filled  Taking  Med   Woke  Up this  Am  With   Upper  Middle towards  Left sided  Back pain  -   Burning  Sensation  - No  Known  Injury     Has  Been taking  Advill  Twice daily   -  May  Need  Pain Rx  -  Pt  Is  Sensitive   To  Narcotics     Patients  Phone  Number   [phone]    [facility] [address]

## 2017-10-26 MED ORDER — GABAPENTIN 100 MG PO CAPS: 100.0000 mg | ORAL_CAPSULE | Freq: Two times a day (BID) | ORAL | 0 refills | Status: DC

## 2017-10-26 NOTE — Addendum Note (Signed)
Addended by: Hillard DankerRAWFORD, ELIZABETH A on: 10/26/2017 12:20 PM   Modules accepted: Orders

## 2017-10-26 NOTE — Assessment & Plan Note (Signed)
Suspected shingles and rx for valtrex 1 g TID for 1 week. She denies needing anything for pain at this time. Advised can use benadryl for itching.

## 2017-10-26 NOTE — Telephone Encounter (Signed)
Sent in gabapentin to take as needed up to twice per day.

## 2017-10-28 NOTE — Telephone Encounter (Signed)
LVM informing patient.

## 2018-03-05 ENCOUNTER — Encounter: Payer: Self-pay | Admitting: Family Medicine

## 2018-03-05 ENCOUNTER — Ambulatory Visit (INDEPENDENT_AMBULATORY_CARE_PROVIDER_SITE_OTHER): Payer: BC Managed Care – PPO | Admitting: Family Medicine

## 2018-03-05 VITALS — BP 126/68 | HR 66 | Temp 98.4°F | Ht 64.0 in | Wt 145.2 lb

## 2018-03-05 DIAGNOSIS — M26621 Arthralgia of right temporomandibular joint: Secondary | ICD-10-CM

## 2018-03-05 DIAGNOSIS — G4489 Other headache syndrome: Secondary | ICD-10-CM

## 2018-03-05 DIAGNOSIS — M5030 Other cervical disc degeneration, unspecified cervical region: Secondary | ICD-10-CM

## 2018-03-05 MED ORDER — METHOCARBAMOL 500 MG PO TABS: 500.0000 mg | ORAL_TABLET | Freq: Three times a day (TID) | ORAL | 0 refills | Status: DC | PRN

## 2018-03-05 NOTE — Progress Notes (Signed)
Subjective:    Patient ID: Grace [name], female    DOB: [birth year], 57 y.o.   MRN: [id number]  HPI Here for headache -3 days   Wt Readings from Last 3 Encounters:  03/05/18 145 lb 4 oz (65.9 kg)  10/24/17 150 lb (68 kg)  05/31/17 144 lb (65.3 kg)   24.93 kg/m   Got up Sunday am  Had some fleeting pains in R side of the head - through the day Took some advil  When it wore off - pain got worse  Then felt it into face and behind her ear  2 advil and tylenol helped  Pain in front and behind ear   Next am- felt it only when she rolled over  Scalp was sensitive when she washed hair  Kept going to Monday  No rash or scalp changes   Has TMJ-wears a nite guard   Today -it is calmed down Chewing gum bothers her   C5, C6 - recent dg with deg change Has dec cervical curve  meloxicam prn (not for her head pain)   Felt "bad" in general  Some light sensitivity  No sound sensitive  No nausea   She has had headaches in the past (perhaps migraine) Also a lot of sinus problems  Had CT 2011   Patient Active Problem List   Diagnosis Date Noted  . Grief reaction 12/13/2014  . Palpitations 10/29/2014  . Dysphagia 10/29/2014  . Acute sinusitis 10/26/2013  . NONSPEC ELEVATION OF LEVELS OF TRANSAMINASE/LDH 08/25/2010  . Headache 04/10/2010  . ARTHRALGIA 12/21/2009  . MENOPAUSAL SYNDROME 07/12/2009  . RHINITIS 09/16/2008  . GASTRITIS 09/16/2008   Past Medical History:  Diagnosis Date  . Allergic rhinitis, cause unspecified   . Back pain    epidural injections  . Foot fracture   . Headache(784.0)   . Nonspecific elevation of levels of transaminase or lactic acid dehydrogenase (LDH)   . Pain in joint, site unspecified   . Symptomatic menopausal or female climacteric states   . Unspecified gastritis and gastroduodenitis without mention of hemorrhage   . Unspecified sinusitis (chronic)    Past Surgical History:  Procedure Laterality Date  . CCY  2006  . COLONOSCOPY      . ESOPHAGOGASTRODUODENOSCOPY  2006   gastritis  . FOOT FRACTURE SURGERY  2010   Social History   Tobacco Use  . Smoking status: Never Smoker  . Smokeless tobacco: Never Used  Substance Use Topics  . Alcohol use: No  . Drug use: No   Family History  Problem Relation Age of Onset  . Other Father        disk disease  . Arthritis Unknown        paternal side  . Other Brother        disk disease   Allergies  Allergen Reactions  . Augmentin [Amoxicillin-Pot Clavulanate] Diarrhea  . Celecoxib     REACTION: rash  . Codeine     REACTION: rash  . Morphine     REACTION: u/k  . Propoxyphene N-Acetaminophen     REACTION: u/k  . Tramadol Hcl     REACTION: u/k  . Sulfa Antibiotics Rash   Current Outpatient Medications on File Prior to Visit  Medication Sig Dispense Refill  . estradiol (VIVELLE-DOT) 0.0375 MG/24HR Place 1 patch onto the skin 2 (two) times a week.    . hydrochlorothiazide (HYDRODIURIL) 25 MG tablet Take 25 mg by mouth daily.    Marland Kitchen latanoprost (  XALATAN) 0.005 % ophthalmic solution Place 1 drop into both eyes at bedtime.    . meloxicam (MOBIC) 15 MG tablet Take 15 mg by mouth daily as needed.  0  . Multiple Vitamin (MULTIVITAMIN) tablet Take 1 tablet by mouth daily.       No current facility-administered medications on file prior to visit.      Review of Systems     Objective:   Physical Exam  Constitutional: She is oriented to person, place, and time. She appears well-developed and well-nourished. No distress.  HENT:  Head: Normocephalic and atraumatic.  Right Ear: External ear normal.  Left Ear: External ear normal.  Nose: Nose normal.  Mouth/Throat: Oropharynx is clear and moist. No oropharyngeal exudate.  No sinus tenderness No temporal tenderness  Significant R TMJ tenderness  Eyes: Pupils are equal, round, and reactive to light. Conjunctivae and EOM are normal. Right eye exhibits no discharge. Left eye exhibits no discharge. No scleral icterus.  No  nystagmus  Neck: Normal range of motion and full passive range of motion without pain. Neck supple. No JVD present. Carotid bruit is not present. No tracheal deviation present. No thyromegaly present.  Cardiovascular: Normal rate, regular rhythm and normal heart sounds.  No murmur heard. Pulmonary/Chest: Effort normal and breath sounds normal. No respiratory distress. She has no wheezes. She has no rales.  Abdominal: Soft. Bowel sounds are normal. She exhibits no distension and no mass. There is no tenderness.  Musculoskeletal: She exhibits no edema or tenderness.  Lymphadenopathy:    She has no cervical adenopathy.  Neurological: She is alert and oriented to person, place, and time. She has normal strength and normal reflexes. She displays no atrophy, no tremor and normal reflexes. No cranial nerve deficit or sensory deficit. She exhibits normal muscle tone. She displays a negative Romberg sign. Coordination and gait normal.  No focal cerebellar signs   Good balance  Neg rhomberg Nl tandem gait Nl finger/nose testing  Skin: Skin is warm and dry. No rash noted. No pallor.  Psychiatric: She has a normal mood and affect. Her behavior is normal. Thought content normal. Her mood appears not anxious.  Voices stressors           Assessment & Plan:   Problem List Items Addressed This Visit      Musculoskeletal and Integument   DDD (degenerative disc disease), cervical    Continue specialist f/u  PT may help  Suspect this worsened her recent headache      Relevant Medications   meloxicam (MOBIC) 15 MG tablet   methocarbamol (ROBAXIN) 500 MG tablet     Other   Headache - Primary    Atypical migraine symptoms including scalp tenderness (no rash) on L side -that is now resolved  Suspect CS dz and TMJ on that side are triggers and tension or spasm may have transformed into migraine type pain  Disc use of better pillow/ continue tx for neck and TMJ Soft foods / straw/cold compress/  mobic for TMJ  Given px for methocarbamol If headache returns- will try meloxicam and methocarbamol  If no imp or gradual worsening -consider imaging >25 minutes spent in face to face time with patient, >50% spent in counselling or coordination of care -including discussion of neck and jaw issues and lifestyle habits for headache       Relevant Medications   meloxicam (MOBIC) 15 MG tablet   methocarbamol (ROBAXIN) 500 MG tablet   TMJ tenderness, right  Pt has symptoms of TMJ dysf and uses nite guard   Continue this  Avoid chewing/gum/ use straw for drinking until improved meloxicam prn  No doubt exac her recent headache

## 2018-03-05 NOTE — Patient Instructions (Signed)
I think you had atypical migraine head pain from a combination of TMJ and neck problems  Continue cervical support pillow  Don't overuse caffeine  Rest and good fluids are important also   If you get another episode - try meloxicam and also methocarbamol (muscle relaxer)  If no improvement or if more frequent- let me know

## 2018-03-06 DIAGNOSIS — M26621 Arthralgia of right temporomandibular joint: Secondary | ICD-10-CM | POA: Insufficient documentation

## 2018-03-06 DIAGNOSIS — M5030 Other cervical disc degeneration, unspecified cervical region: Secondary | ICD-10-CM | POA: Insufficient documentation

## 2018-03-06 NOTE — Assessment & Plan Note (Signed)
Pt has symptoms of TMJ dysf and uses nite guard   Continue this  Avoid chewing/gum/ use straw for drinking until improved meloxicam prn  No doubt exac her recent headache

## 2018-03-06 NOTE — Assessment & Plan Note (Signed)
Atypical migraine symptoms including scalp tenderness (no rash) on L side -that is now resolved  Suspect CS dz and TMJ on that side are triggers and tension or spasm may have transformed into migraine type pain  Disc use of better pillow/ continue tx for neck and TMJ Soft foods / straw/cold compress/ mobic for TMJ  Given px for methocarbamol If headache returns- will try meloxicam and methocarbamol  If no imp or gradual worsening -consider imaging >25 minutes spent in face to face time with patient, >50% spent in counselling or coordination of care -including discussion of neck and jaw issues and lifestyle habits for headache

## 2018-03-06 NOTE — Assessment & Plan Note (Signed)
Continue specialist f/u  PT may help  Suspect this worsened her recent headache

## 2018-06-16 ENCOUNTER — Encounter: Payer: Self-pay | Admitting: Family Medicine

## 2018-06-16 ENCOUNTER — Ambulatory Visit (INDEPENDENT_AMBULATORY_CARE_PROVIDER_SITE_OTHER): Payer: BC Managed Care – PPO | Admitting: Family Medicine

## 2018-06-16 VITALS — BP 120/78 | HR 87 | Ht 64.0 in | Wt 145.8 lb

## 2018-06-16 DIAGNOSIS — R21 Rash and other nonspecific skin eruption: Secondary | ICD-10-CM

## 2018-06-16 NOTE — Progress Notes (Signed)
   Subjective:    Patient ID: Natalia Leatherwood, female    DOB: 08-Sep-1960, 58 y.o.   MRN: [id number]  HPI This is a 57 yo female who presents today with rash over last 24 hours on upper shoulders. Pain and burning. Had a bad headache over last 24 hours, some sensation of hot/cold. No myalgias. No new soaps, lotions, shampoos or detergents. Did some yard work a couple of days ago. Has been spending a great deal of time with her MIL who is dying in Hospice care. She wrapped a blanket around her shoulders, but was wearing a shirt. She had shingles on her right chest in past, did not look like this.   Past Medical History:  Diagnosis Date  . Allergic rhinitis, cause unspecified   . Back pain    epidural injections  . Foot fracture   . Headache(784.0)   . Nonspecific elevation of levels of transaminase or lactic acid dehydrogenase (LDH)   . Pain in joint, site unspecified   . Symptomatic menopausal or female climacteric states   . Unspecified gastritis and gastroduodenitis without mention of hemorrhage   . Unspecified sinusitis (chronic)    Past Surgical History:  Procedure Laterality Date  . CCY  2006  . COLONOSCOPY    . ESOPHAGOGASTRODUODENOSCOPY  2006   gastritis  . FOOT FRACTURE SURGERY  2010   Family History  Problem Relation Age of Onset  . Other Father        disk disease  . Arthritis Unknown        paternal side  . Other Brother        disk disease   Social History   Tobacco Use  . Smoking status: Never Smoker  . Smokeless tobacco: Never Used  Substance Use Topics  . Alcohol use: No  . Drug use: No      Review of Systems Per HPI    Objective:   Physical Exam  Constitutional: She appears well-developed and well-nourished.  HENT:  Head: Normocephalic and atraumatic.  Eyes: Conjunctivae are normal.  Cardiovascular: Normal rate.  Pulmonary/Chest: Effort normal.  Skin: Skin is warm and dry. Rash (Bilateral posterior shoulders and upper back with scattered  erythemaous macules and papules, a few are pustular. No lseions on chest, abdomen or lower back. One lesion on anterior thigh. ) noted.  Psychiatric: She has a normal mood and affect. Her behavior is normal. Judgment and thought content normal.  Vitals reviewed.     BP 120/78   Pulse 87   Ht 5\' 4"  (1.626 m)   Wt 145 lb 12.8 oz (66.1 kg)   LMP 08/20/1988   SpO2 98%   BMI 25.03 kg/m  Wt Readings from Last 3 Encounters:  06/16/18 145 lb 12.8 oz (66.1 kg)  03/05/18 145 lb 4 oz (65.9 kg)  10/24/17 150 lb (68 kg)       Assessment & Plan:  1. Rash and nonspecific skin eruption - unclear etiology, does not appear infected, not vesicular like shingles - discussed symptomatic treatment (per AVS) - RTC instructions   Olean Ree, FNP-BC  [facility], [facility]  06/16/2018 4:51 PM

## 2018-06-16 NOTE — Patient Instructions (Signed)
Not sure what is causing your rash  Take a daily over the counter antihistamine as needed for itch, can apply hydrocortisone and anti-itch cream as needed  Avoid hot water, may make worse  Please be in touch if spreading

## 2018-10-14 ENCOUNTER — Encounter: Payer: Self-pay | Admitting: Internal Medicine

## 2018-10-14 ENCOUNTER — Ambulatory Visit: Payer: BC Managed Care – PPO | Admitting: Internal Medicine

## 2018-10-14 ENCOUNTER — Other Ambulatory Visit: Payer: Self-pay | Admitting: Internal Medicine

## 2018-10-14 ENCOUNTER — Telehealth: Payer: Self-pay

## 2018-10-14 VITALS — BP 124/78 | HR 83 | Temp 98.3°F | Wt 147.0 lb

## 2018-10-14 DIAGNOSIS — J329 Chronic sinusitis, unspecified: Secondary | ICD-10-CM | POA: Diagnosis not present

## 2018-10-14 DIAGNOSIS — B9789 Other viral agents as the cause of diseases classified elsewhere: Secondary | ICD-10-CM | POA: Diagnosis not present

## 2018-10-14 MED ORDER — PROMETHAZINE-DM 6.25-15 MG/5ML PO SYRP: 5.0000 mL | ORAL_SOLUTION | Freq: Four times a day (QID) | ORAL | 0 refills | Status: DC | PRN

## 2018-10-14 MED ORDER — METHYLPREDNISOLONE ACETATE 80 MG/ML IJ SUSP
80.0000 mg | Freq: Once | INTRAMUSCULAR | Status: AC
  Administered 2018-10-14: 80 mg via INTRAMUSCULAR

## 2018-10-14 NOTE — Progress Notes (Signed)
HPI  Pt presents to the clinic today with c/o runny nose, sore throat and cough. She reports this started 4 days ago. She is blowing blood tinged yellow mucous out of her nose. She denies difficulty swallowing. The cough is mostly non productive. She denies nasal congestion, ear pain or shortness of breath. She reports fever up to 100.0, chills and body aches. She has tried Afrin, Mucinex, Tylenol Sinus with minimal relief. She has a history of allergies. She has not had sick contacts that she is aware of.  Review of Systems      Past Medical History:  Diagnosis Date  . Allergic rhinitis, cause unspecified   . Back pain    epidural injections  . Foot fracture   . Headache(784.0)   . Nonspecific elevation of levels of transaminase or lactic acid dehydrogenase (LDH)   . Pain in joint, site unspecified   . Symptomatic menopausal or female climacteric states   . Unspecified gastritis and gastroduodenitis without mention of hemorrhage   . Unspecified sinusitis (chronic)     Family History  Problem Relation Age of Onset  . Other Father        disk disease  . Arthritis Unknown        paternal side  . Other Brother        disk disease    Social History   Socioeconomic History  . Marital status: Married    Spouse name: Not on file  . Number of children: Not on file  . Years of education: Not on file  . Highest education level: Not on file  Occupational History  . Not on file  Social Needs  . Financial resource strain: Not on file  . Food insecurity:    Worry: Not on file    Inability: Not on file  . Transportation needs:    Medical: Not on file    Non-medical: Not on file  Tobacco Use  . Smoking status: Never Smoker  . Smokeless tobacco: Never Used  Substance and Sexual Activity  . Alcohol use: No  . Drug use: No  . Sexual activity: Not on file  Lifestyle  . Physical activity:    Days per week: Not on file    Minutes per session: Not on file  . Stress: Not on file   Relationships  . Social connections:    Talks on phone: Not on file    Gets together: Not on file    Attends religious service: Not on file    Active member of club or organization: Not on file    Attends meetings of clubs or organizations: Not on file    Relationship status: Not on file  . Intimate partner violence:    Fear of current or ex partner: Not on file    Emotionally abused: Not on file    Physically abused: Not on file    Forced sexual activity: Not on file  Other Topics Concern  . Not on file  Social History Narrative   Non smoker      Has children    Allergies  Allergen Reactions  . Augmentin [Amoxicillin-Pot Clavulanate] Diarrhea  . Celecoxib     REACTION: rash  . Codeine     REACTION: rash  . Morphine     REACTION: u/k  . Propoxyphene N-Acetaminophen     REACTION: u/k  . Tramadol Hcl     REACTION: u/k  . Sulfa Antibiotics Rash     Constitutional: Positive fever.  Denies headache, fatigue or abrupt weight changes.  HEENT:  Positive runny nose, sore throat. Denies eye redness, eye pain, pressure behind the eyes, facial pain, nasal congestion, ear pain, ringing in the ears, wax buildup, runny nose or bloody nose. Respiratory: Positive cough. Denies difficulty breathing or shortness of breath.  Cardiovascular: Denies chest pain, chest tightness, palpitations or swelling in the hands or feet.   No other specific complaints in a complete review of systems (except as listed in HPI above).  Objective:   BP 124/78   Pulse 83   Temp 98.3 F (36.8 C) (Oral)   Wt 147 lb (66.7 kg)   LMP 08/20/1988   SpO2 97%   BMI 25.23 kg/m   Nicki Reaper, NP Wt Readings from Last 3 Encounters:  10/14/18 147 lb (66.7 kg)  06/16/18 145 lb 12.8 oz (66.1 kg)  03/05/18 145 lb 4 oz (65.9 kg)     General: Appears her stated age,  in NAD. HEENT: Head: normal shape and size, maxillary sinus tenderness noted;Ears: Tm's gray and intact, normal light reflex; Nose: mucosa  pink and moist, septum midline; Throat/Mouth: + PND. Teeth present, mucosa pink and moist, no exudate noted, no lesions or ulcerations noted.  Neck: No cervical lymphadenopathy.  Cardiovascular: Normal rate and rhythm. S1,S2 noted.  No murmur, rubs or gallops noted.  Pulmonary/Chest: Normal effort and positive vesicular breath sounds with intermittent expiratory wheezing noted on the right. No respiratory distress. No rales or ronchi noted.       Assessment & Plan:   Viral Sinusitis:  Discussed why we would not test her for flu at this point Get some rest and drink plenty of water 80 mg Depo IM today Start Flonase or Zyrtec OTC Tylenol or Ibuprofen if needed based on labs RX for Promethazine DM cough syrup If no improvement by Friday, consider sending in abx Work note provided  RTC as needed or if symptoms persist.   Nicki Reaper, NP

## 2018-10-14 NOTE — Addendum Note (Signed)
Addended by: Roena Malady on: 10/14/2018 12:47 PM   Modules accepted: Orders

## 2018-10-14 NOTE — Patient Instructions (Signed)

## 2018-10-14 NOTE — Telephone Encounter (Signed)
[facility] Night - Client Nonclinical Telephone Record [facility] Client [facility] Night - Client Client Site [facility] - Night Physician Roxy Manns - MD Contact Type Call Who Is Calling Patient / Member / Family / Caregiver Caller Name Chitra Igou Caller Phone Number [phone] Patient Name Grace [name] Patient DOB 03-03-1961 Call Type Message Only Information Provided Reason for Call Request to Schedule Office Appointment Initial Comment Caller states wants appt for today; sore throat and headache; take mucinex and advil; cough changed last night; Caller declined triage; wants appt; Additional Comment Caller declined triage; adv to call after 8am to schedule appt; Call Closed By: Albin Fischer Transaction Date/Time: 10/14/2018 7:42:53 AM (ET)

## 2018-10-14 NOTE — Telephone Encounter (Signed)
Pt already has appt scheduled 10/14/18 at 10:30 with Pamala Hurry NP.

## 2018-10-17 ENCOUNTER — Telehealth: Payer: Self-pay | Admitting: Family Medicine

## 2018-10-17 MED ORDER — AZITHROMYCIN 250 MG PO TABS: ORAL_TABLET | ORAL | 0 refills | Status: DC

## 2018-10-17 NOTE — Telephone Encounter (Signed)
Azithromycin sent to pharmacy. She is allergic to codeine so no other alternative is available. Can try Robitussin or Nyquil and see if that works.

## 2018-10-17 NOTE — Telephone Encounter (Signed)
Pt called to give update. She said she is not feeling any better- still congested,teeth still hurt. She is requesting the antibiotic that was discussed. Also, [facility] and no one has it- will not be in until 3/10. She wants to know if there is an alternative- pharmacy rec Delsym + cough drops but it is not working. She is heading into court but can leave msg on direct work number [phone]. Can use either [facility] [facility] [place]- just advise where to pick up.

## 2018-10-17 NOTE — Telephone Encounter (Signed)
Left message on voicemail.

## 2018-10-17 NOTE — Addendum Note (Signed)
Addended by: Lorre Munroe on: 10/17/2018 01:40 PM   Modules accepted: Orders

## 2018-10-20 NOTE — Telephone Encounter (Signed)
Patient left a voicemail stating that she did not get the message that was left for her until after 5:30 on Friday because she was at work. Patient stated that she got the antibiotic, but really needs something for her cough. Patient stated that she is coughing a lot and can not sleep and hardly slept at all last night. Patient stated that she has tried Delsym, cough drops, honey and numerous items over the counter, but nothing is helping.  Patient request a call back on her office phone. [phone]

## 2018-10-20 NOTE — Telephone Encounter (Signed)
Does she want to try something with codeine in it if the pharmacy does not have the Promethazine DM

## 2018-10-20 NOTE — Telephone Encounter (Signed)
Left detailed msg on VM per HIPAA  

## 2018-10-22 ENCOUNTER — Telehealth: Payer: Self-pay | Admitting: Internal Medicine

## 2018-10-22 NOTE — Telephone Encounter (Signed)
Patient was prescribed Bronfed. [facility] and refills for [facility]

## 2018-10-22 NOTE — Telephone Encounter (Signed)
Pt calling concerning the previous message about her cough medication, please adivse [facility] the information]on so she can pick it up

## 2018-10-23 ENCOUNTER — Other Ambulatory Visit: Payer: Self-pay

## 2018-10-23 MED ORDER — BENZONATATE 200 MG PO CAPS: 200.0000 mg | ORAL_CAPSULE | Freq: Two times a day (BID) | ORAL | 1 refills | Status: DC | PRN

## 2018-10-23 NOTE — Telephone Encounter (Signed)
LM on patient's cell that prescription was called in. [facility] to make sue & cancel other script for cough syrup.

## 2018-10-23 NOTE — Telephone Encounter (Signed)
Patient is calling back very anxious for clarification on her cough med to pharmacy.  She has been informed that this has been sent to the provider for clarification and will notify them of the urgency.  Thanks.   [facility] Bromfed - need quantity, directions and ? Refills.

## 2018-10-23 NOTE — Telephone Encounter (Signed)
LMTCB so I can advise on below.

## 2018-10-23 NOTE — Telephone Encounter (Signed)
Patient said she called [facility]. They said they didn't have the rx for Tessalon capsules, but they do have the medication from the original rx and they're waiting for the orders.Please leave a detailed message on patient's voice mail with instructions. She'll be in court until the end of the day. Patient doesn't care what she gets she just wants something called in, so she can sleep tonight.

## 2018-10-23 NOTE — Telephone Encounter (Signed)
Patient called on her break. She had to go back to work at 2:00, so I let her know Dr.Tower's instructions and patient voiced understanding.

## 2018-10-23 NOTE — Telephone Encounter (Signed)
[facility] Night - Client TELEPHONE ADVICE RECORD [facility] Patient Name: Grace [name] Gender: Female DOB: Dec 17, 1960 Age: 58 Y 8 M 12 D Return Phone Number: [phone] (Primary) Address: [place] [address] Client [facility] Night - Client Client Site [facility] - Night Physician Tower, Idamae Schuller - MD Contact Type Call Who Is Calling Patient / Member / Family / Caregiver Call Type Triage / Clinical Relationship To Patient Self Return Phone Number [phone] (Primary) Chief Complaint Cough Reason for Call Symptomatic / Request for Health Information Initial Comment Caller states her cough medicine is on back order. [facility] states that [facility] it but not the dosage. Needing assistance. Translation No No Triage Reason Patient declined Nurse Assessment Nurse: Naoma Diener, RN, Marchelle Folks Date/Time Lamount Cohen Time): 10/22/2018 6:44:49 PM Confirm and document reason for call. If symptomatic, describe symptoms. ---RX was suppose to have it filled last week, was on back order, called today the office said cannot give anything else, [facility] but not [facility], [facility] and was suppose to correct it with [facility]. But [facility] says not corrected still. Unsure of medication name. already on abx, does not want triage. Still coughing been 10 days. advised of client's directive in regards to medications will call office in the morning. Has the patient traveled to [place] OR had close contact with a person known to have the novel coronavirus illness in the last 14 days? ---Not Applicable Does the patient have any new or worsening symptoms? ---Yes Will a triage be completed? ---No Select reason for no triage. ---Patient declined Nurse: Naoma Diener, RN, Marchelle Folks Date/Time (Eastern Time): 10/22/2018 6:45:19 PM Please select the assessment type ---Pharmacy  clarification Additional Documentation ---RX was suppose to have it filled last week, was on back order, called today the office said cannot give anything else, [facility] but not [facility] dosage, [facility] said [facility] office and was suppose to correct it with [facility]. But [facility] says not corrected still. Unsure of medication name. already on abx, does not want triage. Still coughing been 10 days. advised of client's directive in regards to medications will call office in the morning. Is there an on-call physician for the client? ---Yes PLEASE NOTE: All timestamps contained within this report are represented as [place] Standard Time. CONFIDENTIALTY NOTICE: This fax transmission is intended only for the addressee. It contains information that is legally privileged, confidential or otherwise protected from use or disclosure. If you are not the intended recipient, you are strictly prohibited from reviewing, disclosing, copying using or disseminating any of this information or taking any action in reliance on or regarding this information. If you have received this fax in error, please notify us immediately by telephone so that we can arrange for its return to [place]. Phone: [phone], Toll-Free: [phone], Fax: [phone] Page: 2 of 2 Call Id: [id number] Nurse Assessment Do the client directives allow paging the on call for medication concerns? ---No Guidelines Guideline Title Affirmed Question Affirmed Notes Nurse Date/Time (Eastern Time) Disp. Time Lamount Cohen Time) Disposition Final User 10/22/2018 6:45:38 PM Clinical Call Yes Naoma Diener, RN, Marchelle Folks

## 2018-10-23 NOTE — Telephone Encounter (Signed)
I reviewed her visit with NP Baity and last phone note  Per phone note the pharmacy could not get promethazine DM  When contacted I offered a robitussin with codeine px and she declined   Please sent in tessalon caps 200 mg 1 po tid prn cough (swallow whole, to not bite pill) #30 1 refill  This can be taken in conjunction with otc cough medicine  Hope it helps!

## 2019-06-01 ENCOUNTER — Telehealth: Payer: Self-pay | Admitting: Family Medicine

## 2019-06-01 NOTE — Telephone Encounter (Signed)
If she wants the pneumonia shot- could have the pna 23  Can give that and flu together  Would recommend she then wait a month for shingrix   (also remind her to check on coverage) Thanks

## 2019-06-01 NOTE — Telephone Encounter (Signed)
Best number [phone] work number before 5  After 5    [phone]  Pt called wanting to schedule flu shot.  She wanted to know if she could have pneumonia and shingles vaccine at came time  [facility] to schedule??

## 2019-06-01 NOTE — Telephone Encounter (Signed)
Patient notified of Dr.Tower's comments.  Patient scheduled nurse visit for pneumonia 23 and flu shot on 08/18/19.  Patient will check with her insurance about the Shingrix shot.

## 2019-06-18 ENCOUNTER — Ambulatory Visit (INDEPENDENT_AMBULATORY_CARE_PROVIDER_SITE_OTHER): Payer: BC Managed Care – PPO | Admitting: *Deleted

## 2019-06-18 DIAGNOSIS — Z23 Encounter for immunization: Secondary | ICD-10-CM | POA: Diagnosis not present

## 2019-06-18 NOTE — Progress Notes (Signed)
Per orders of Dr. Damita Dunnings in Dr Marliss Coots absence, injection of Flu vaccine and PNA23 given by Virl Cagey. Patient tolerated injection well.

## 2019-06-22 NOTE — Telephone Encounter (Signed)
Allergy list updated. Left VM letting pt know Dr. Marliss Coots comments

## 2019-06-22 NOTE — Telephone Encounter (Signed)
Patient called and stated that she had her flu and pneumonia shot on Friday 10/30. Patient states that she had her pneumonia shot in her left arm, and she states she had lots of redness, swelling, and a rash that extended from the injection site, to underneath her armpit. Patietn states that she just wanted to give Dr. Glori Bickers an FYI for when and if she needs the 2nd penumonia vaccine. Thanks!

## 2019-06-22 NOTE — Telephone Encounter (Signed)
Good to know, thanks  Warm/cool compresses may help Please note local reaction in her chart If worse or new symptoms let us know

## 2020-01-08 ENCOUNTER — Encounter: Payer: Self-pay | Admitting: Family Medicine

## 2020-01-08 ENCOUNTER — Telehealth (INDEPENDENT_AMBULATORY_CARE_PROVIDER_SITE_OTHER): Payer: BC Managed Care – PPO | Admitting: Family Medicine

## 2020-01-08 DIAGNOSIS — J0100 Acute maxillary sinusitis, unspecified: Secondary | ICD-10-CM | POA: Diagnosis not present

## 2020-01-08 MED ORDER — DOXYCYCLINE HYCLATE 100 MG PO TABS: 100.0000 mg | ORAL_TABLET | Freq: Two times a day (BID) | ORAL | 0 refills | Status: DC

## 2020-01-08 NOTE — Progress Notes (Signed)
Virtual Visit via Video Note  I connected with Grace [name] on 01/08/20 at  2:30 PM EDT by a video enabled telemedicine application and verified that I am speaking with the correct person using two identifiers.  Location: Patient: home Provider: office    I discussed the limitations of evaluation and management by telemedicine and the availability of in person appointments. The patient expressed understanding and agreed to proceed.  Parties involved in encounter  Patient: Grace [name]  Provider:  Loura Pardon MD    History of Present Illness: Pt presents with c/o congestion   Started Tuesday evening - pnd  Then Wednesday - nasal congestion and ST Then ST worse  Then facial pain and pressure -moderate to severe  Nasal d/c has turned green   No allergies   Some headache on top of head also  No fever - this am 98.6  No chills or body aches  Feels tired  A little bit dizzy  Ears are full/not popping   No loss of taste or smell Dec appetite  Drinking lots of fluids   No n/v/d    She was exposed to grandson who had a cold (sat)  He tested neg for covid and the flu   Had her covid vaccines in march  advil cold and sinus  Then tried expectorant Then ny quil cold and flu      Patient Active Problem List   Diagnosis Date Noted  . DDD (degenerative disc disease), cervical 03/06/2018  . TMJ tenderness, right 03/06/2018  . Palpitations 10/29/2014  . Acute sinusitis 10/26/2013  . NONSPEC ELEVATION OF LEVELS OF TRANSAMINASE/LDH 08/25/2010  . Headache 04/10/2010  . MENOPAUSAL SYNDROME 07/12/2009  . RHINITIS 09/16/2008   Past Medical History:  Diagnosis Date  . Allergic rhinitis, cause unspecified   . Back pain    epidural injections  . Foot fracture   . Headache(784.0)   . Nonspecific elevation of levels of transaminase or lactic acid dehydrogenase (LDH)   . Pain in joint, site unspecified   . Symptomatic menopausal or female climacteric states   .  Unspecified gastritis and gastroduodenitis without mention of hemorrhage   . Unspecified sinusitis (chronic)    Past Surgical History:  Procedure Laterality Date  . CCY  2006  . COLONOSCOPY    . ESOPHAGOGASTRODUODENOSCOPY  2006   gastritis  . FOOT FRACTURE SURGERY  2010   Social History   Tobacco Use  . Smoking status: Never Smoker  . Smokeless tobacco: Never Used  Substance Use Topics  . Alcohol use: No  . Drug use: No   Family History  Problem Relation Age of Onset  . Other Father        disk disease  . Arthritis Unknown        paternal side  . Other Brother        disk disease   Allergies  Allergen Reactions  . Augmentin [Amoxicillin-Pot Clavulanate] Diarrhea  . Celecoxib     REACTION: rash  . Codeine     REACTION: rash  . Morphine     REACTION: u/k  . Propoxyphene N-Acetaminophen     REACTION: u/k  . Tramadol Hcl     REACTION: u/k  . Pneumococcal Vaccines Rash    Local reaction on arm. Redness swelling and rash  . Sulfa Antibiotics Rash   Current Outpatient Medications on File Prior to Visit  Medication Sig Dispense Refill  . estradiol (VIVELLE-DOT) 0.0375 MG/24HR Place 1 patch onto the skin  2 (two) times a week.    . hydrochlorothiazide (HYDRODIURIL) 25 MG tablet Take 25 mg by mouth daily.    Marland Kitchen latanoprost (XALATAN) 0.005 % ophthalmic solution Place 1 drop into both eyes at bedtime.    . Multiple Vitamin (MULTIVITAMIN) tablet Take 1 tablet by mouth daily.      . timolol (BETIMOL) 0.5 % ophthalmic solution Place 1 drop into both eyes in the morning.     No current facility-administered medications on file prior to visit.   Review of Systems  Constitutional: Positive for malaise/fatigue. Negative for chills and fever.  HENT: Positive for congestion and sinus pain. Negative for ear pain and sore throat.   Eyes: Negative for blurred vision, discharge and redness.  Respiratory: Negative for cough, shortness of breath, wheezing and stridor.   Cardiovascular:  Negative for chest pain, palpitations and leg swelling.  Gastrointestinal: Negative for abdominal pain, diarrhea, nausea and vomiting.  Musculoskeletal: Negative for myalgias.  Skin: Negative for rash.  Neurological: Positive for headaches. Negative for dizziness.    Observations/Objective: Patient appears well, in no distress Weight is baseline  No facial swelling or asymmetry Pt indicates sinus tenderness in cheeks under eyes  Normal voice-not hoarse and no slurred speech No obvious tremor or mobility impairment Moving neck and UEs normally Able to hear the call well  No cough or shortness of breath during interview  Talkative and mentally sharp with no cognitive changes No skin changes on face or neck , no rash or pallor Affect is normal    Assessment and Plan: Problem List Items Addressed This Visit      Respiratory   Acute sinusitis - Primary    With facial pain and purulent nasal d/c after catching a cold from grandchild (no covid exp and she has been immunized) Px doxycycline  Fluids/rest Nasal saline Steam/ warm compresses  Work note done Update if not starting to improve in a week or if worsening   Consider covid testing if worse or no improvement       Relevant Medications   doxycycline (VIBRA-TABS) 100 MG tablet       Follow Up Instructions: Take doxycycline as directed for a sinus infection   Drink fluids and rest  Nasal saline may help  Treat symptoms  If worse or not improving consider covid testing   I did write a work note to return when feeling better    I discussed the assessment and treatment plan with the patient. The patient was provided an opportunity to ask questions and all were answered. The patient agreed with the plan and demonstrated an understanding of the instructions.   The patient was advised to call back or seek an in-person evaluation if the symptoms worsen or if the condition fails to improve as anticipated.     Roxy Manns, MD

## 2020-01-08 NOTE — Patient Instructions (Signed)
Take doxycycline as directed for a sinus infection   Drink fluids and rest  Nasal saline may help  Treat symptoms  If worse or not improving consider covid testing   I did write a work note to return when feeling better

## 2020-01-08 NOTE — Assessment & Plan Note (Signed)
With facial pain and purulent nasal d/c after catching a cold from grandchild (no covid exp and she has been immunized) Px doxycycline  Fluids/rest Nasal saline Steam/ warm compresses  Work note done Update if not starting to improve in a week or if worsening   Consider covid testing if worse or no improvement

## 2020-05-28 ENCOUNTER — Ambulatory Visit (INDEPENDENT_AMBULATORY_CARE_PROVIDER_SITE_OTHER): Payer: BC Managed Care – PPO

## 2020-05-28 ENCOUNTER — Other Ambulatory Visit: Payer: Self-pay

## 2020-05-28 DIAGNOSIS — Z23 Encounter for immunization: Secondary | ICD-10-CM | POA: Diagnosis not present

## 2020-06-27 ENCOUNTER — Telehealth: Payer: Self-pay

## 2020-06-27 NOTE — Telephone Encounter (Signed)
Noted, will evaluate. 

## 2020-06-27 NOTE — Telephone Encounter (Signed)
[facility] - Client TELEPHONE ADVICE RECORD [facility] Patient Name: Grace [name] Gender: Female DOB: 09-22-1960 Age: 59 Y 4 M 18 D Return Phone Number: [phone] (Primary) Address: [address] City/State/Zip: [place] [address] Client [facility] Day - Client Client Site [facility] - Day Physician Milinda Antis, Idamae Schuller - MD Contact Type Call Who Is Calling Patient / Member / Family / Caregiver Call Type Triage / Clinical Relationship To Patient Self Return Phone Number [phone] (Primary) Chief Complaint Facial Pain Reason for Call Symptomatic / Request for Health Information Initial Comment Caller states she has been taking cold and flu nyquil nighttime and daytime. Caller states she now has green snot her face is hurting and her teeth. Translation No Nurse Assessment Nurse: Thana Farr, RN, Ladona Ridgel Date/Time Lamount Cohen Time): 06/27/2020 1:52:52 PM Confirm and document reason for call. If symptomatic, describe symptoms. ---Congestion and sinus pain. Has been taking otc for 1 week with no relief. Mild cough. Hoarseness started today. Decreased appetite. Drinking and urinating normally. Denies cough, difficulty breathing, chest pain, fever, or any other symptoms at this time. Does the patient have any new or worsening symptoms? ---Yes Will a triage be completed? ---Yes Related visit to physician within the last 2 weeks? ---No Does the PT have any chronic conditions? (i.e. diabetes, asthma, this includes High risk factors for pregnancy, etc.) ---No Is this a behavioral health or substance abuse call? ---No Guidelines Guideline Title Affirmed Question Affirmed Notes Nurse Date/Time Lamount Cohen Time) Sinus Pain or Congestion Earache Thana Farr, RN, Ladona Ridgel 06/27/2020 1:55:20 PM Disp. Time Lamount Cohen Time) Disposition Final User 06/27/2020 2:00:44 PM See PCP within 24 Hours Yes Thana Farr, RN, Ursula Beath  Disagree/Comply Comply Caller Understands Yes PLEASE NOTE: All timestamps contained within this report are represented as [place] Standard Time. CONFIDENTIALTY NOTICE: This fax transmission is intended only for the addressee. It contains information that is legally privileged, confidential or otherwise protected from use or disclosure. If you are not the intended recipient, you are strictly prohibited from reviewing, disclosing, copying using or disseminating any of this information or taking any action in reliance on or regarding this information. If you have received this fax in error, please notify us immediately by telephone so that we can arrange for its return to [place]. Phone: [phone], Toll-Free: [phone], Fax: [phone] Page: 2 of 2 Call Id: [id number] PreDisposition Call Doctor Care Advice Given Per Guideline SEE PCP WITHIN 24 HOURS: PAIN MEDICINES: * For pain relief, you can take either acetaminophen, ibuprofen, or naproxen. LOCAL COLD: * Apply a cold pack or ice in a wet washcloth to the outer ear for 20 minutes. * You become worse * Difficulty breathing (and not relieved by cleaning out nose) CALL BACK IF: Referrals Warm transfer to backline

## 2020-06-27 NOTE — Telephone Encounter (Signed)
Per appt notes pt already has video visit scheduled for 06/28/20 at 8 AM with Allayne Gitelman NP.

## 2020-06-28 ENCOUNTER — Telehealth (INDEPENDENT_AMBULATORY_CARE_PROVIDER_SITE_OTHER): Payer: BC Managed Care – PPO | Admitting: Primary Care

## 2020-06-28 VITALS — Ht 64.0 in | Wt 147.0 lb

## 2020-06-28 DIAGNOSIS — T3695XA Adverse effect of unspecified systemic antibiotic, initial encounter: Secondary | ICD-10-CM

## 2020-06-28 DIAGNOSIS — J019 Acute sinusitis, unspecified: Secondary | ICD-10-CM | POA: Diagnosis not present

## 2020-06-28 DIAGNOSIS — B379 Candidiasis, unspecified: Secondary | ICD-10-CM | POA: Diagnosis not present

## 2020-06-28 MED ORDER — FLUCONAZOLE 150 MG PO TABS: 150.0000 mg | ORAL_TABLET | Freq: Once | ORAL | 0 refills | Status: AC

## 2020-06-28 MED ORDER — DOXYCYCLINE HYCLATE 100 MG PO TABS: 100.0000 mg | ORAL_TABLET | Freq: Two times a day (BID) | ORAL | 0 refills | Status: DC

## 2020-06-28 NOTE — Patient Instructions (Signed)
Start Doxycycline antibiotic for the infection. Take 1 tablet by mouth twice daily for 7 days.  Use the fluconazole tablets if needed.  Please follow up with Dr. Milinda Antis if no improvement in 3-4 days.  It was a pleasure meeting you! Mayra Reel, NP-C

## 2020-06-28 NOTE — Progress Notes (Signed)
Subjective:    Patient ID: Grace [name], female    DOB: 1961/05/19, 59 y.o.   MRN: [id number]  HPI  Virtual Visit via Video Note  I connected with Grace [name] on 06/28/20 at  8:00 AM EST by a video enabled telemedicine application and verified that I am speaking with the correct person using two identifiers.  We attempted to connect via video but she was unable to operate her video. Our phone conversation lasted 11 min and 24 sec.  Location: Patient: Home Provider: Office Participants: Patient and myself   I discussed the limitations of evaluation and management by telemedicine and the availability of in person appointments. The patient expressed understanding and agreed to proceed.  History of Present Illness:  Grace [name] is a 59 year old female patient of Dr. Milinda Antis with a history of rhinitis, acute sinusitis, palpitations who presents today with a chief complaint of sinus pressure.  She also reports post nasal drip, nasal congestion, hoarse voice, ear fullness, frontal sinus pressure. She's pulling out thick/green mucous from her nasal cavity. Symptoms began 8 days ago. She's been taking Nyquil Cold and Flu and Advil without much improvement.   She's had a flu shot this season. She did not complete a Covid-19 test but is very sure that her symptoms are not secondary to Covid-19. She is fully vaccinated. She does go into the office, everyone wears masks, a few people have been "sick with colds" at work, did not get tested for Covid-19. She denies loss of taste/smell, diarrhea, fevers, chills, body aches.   She is also requesting two fluconazole tablets due to history of antibiotic induced yeast infection.   Observations/Objective:  Alert and oriented. Sounds congested,. No distress. Speaking in complete sentences.   Assessment and Plan:  Acute sinusitis symptoms x 8 days for which are progressing, cannot completely rule out Covid-19 but she is fully vaccinated and stays  at home mostly.  She denies a history of PCN allergy, unsure how this was entered on her chart.  Given progressing sinusitis symptoms, coupled with medical history and seasonal changes, will proceed with bacterial treatment. Rx for Doxycycline course sent to pharmacy. Fluconazole Rx sent to pharmacy as well. See above.  Discussed other conservative measures. Follow up with PCP if no improvement in 3-4 days.  Follow Up Instructions:  Start Doxycycline antibiotic for the infection. Take 1 tablet by mouth twice daily for 7 days.  Use the fluconazole tablets if needed.  Please follow up with Dr. Milinda Antis if no improvement in 3-4 days.  It was a pleasure meeting you! Mayra Reel, NP-C    I discussed the assessment and treatment plan with the patient. The patient was provided an opportunity to ask questions and all were answered. The patient agreed with the plan and demonstrated an understanding of the instructions.   The patient was advised to call back or seek an in-person evaluation if the symptoms worsen or if the condition fails to improve as anticipated.    Doreene Nest, NP    Review of Systems  Constitutional: Negative for chills, fatigue and fever.  HENT: Positive for congestion, postnasal drip, sinus pressure and sinus pain. Negative for sore throat.   Respiratory: Negative for cough and shortness of breath.   Gastrointestinal: Negative for diarrhea.  Allergic/Immunologic: Positive for environmental allergies.       Past Medical History:  Diagnosis Date   Allergic rhinitis, cause unspecified    Back pain    epidural  injections   Foot fracture    Headache(784.0)    Nonspecific elevation of levels of transaminase or lactic acid dehydrogenase (LDH)    Pain in joint, site unspecified    Symptomatic menopausal or female climacteric states    Unspecified gastritis and gastroduodenitis without mention of hemorrhage    Unspecified sinusitis (chronic)        Social History   Socioeconomic History   Marital status: Married    Spouse name: Not on file   Number of children: Not on file   Years of education: Not on file   Highest education level: Not on file  Occupational History   Not on file  Tobacco Use   Smoking status: Never Smoker   Smokeless tobacco: Never Used  Substance and Sexual Activity   Alcohol use: No   Drug use: No   Sexual activity: Not on file  Other Topics Concern   Not on file  Social History Narrative   Non smoker      Has children   Social Determinants of Health   Financial Resource Strain:    Difficulty of Paying Living Expenses: Not on file  Food Insecurity:    Worried About Running Out of Food in the Last Year: Not on file   Ran Out of Food in the Last Year: Not on file  Transportation Needs:    Lack of Transportation (Medical): Not on file   Lack of Transportation (Non-Medical): Not on file  Physical Activity:    Days of Exercise per Week: Not on file   Minutes of Exercise per Session: Not on file  Stress:    Feeling of Stress : Not on file  Social Connections:    Frequency of Communication with Friends and Family: Not on file   Frequency of Social Gatherings with Friends and Family: Not on file   Attends Religious Services: Not on file   Active Member of Clubs or Organizations: Not on file   Attends Banker Meetings: Not on file   Marital Status: Not on file  Intimate Partner Violence:    Fear of Current or Ex-Partner: Not on file   Emotionally Abused: Not on file   Physically Abused: Not on file   Sexually Abused: Not on file    Past Surgical History:  Procedure Laterality Date   CCY  2006   COLONOSCOPY     ESOPHAGOGASTRODUODENOSCOPY  2006   gastritis   FOOT FRACTURE SURGERY  2010    Family History  Problem Relation Age of Onset   Other Father        disk disease   Arthritis Unknown        paternal side   Other Brother         disk disease    Allergies  Allergen Reactions   Augmentin [Amoxicillin-Pot Clavulanate] Diarrhea   Celecoxib     REACTION: rash   Codeine     REACTION: rash   Morphine     REACTION: u/k   Propoxyphene N-Acetaminophen     REACTION: u/k   Tramadol Hcl     REACTION: u/k   Pneumococcal Vaccines Rash    Local reaction on arm. Redness swelling and rash   Sulfa Antibiotics Rash    Current Outpatient Medications on File Prior to Visit  Medication Sig Dispense Refill   doxycycline (VIBRA-TABS) 100 MG tablet Take 1 tablet (100 mg total) by mouth 2 (two) times daily. 14 tablet 0   estradiol (VIVELLE-DOT) 0.0375 MG/24HR  Place 1 patch onto the skin 2 (two) times a week.     hydrochlorothiazide (HYDRODIURIL) 25 MG tablet Take 25 mg by mouth daily.     latanoprost (XALATAN) 0.005 % ophthalmic solution Place 1 drop into both eyes at bedtime.     Multiple Vitamin (MULTIVITAMIN) tablet Take 1 tablet by mouth daily.       timolol (BETIMOL) 0.5 % ophthalmic solution Place 1 drop into both eyes in the morning.     No current facility-administered medications on file prior to visit.    Ht 5\' 4"  (1.626 m)    Wt 147 lb (66.7 kg)    LMP 08/20/1988    BMI 25.23 kg/m    Objective:   Physical Exam Constitutional:      General: She is not in acute distress. Pulmonary:     Effort: Pulmonary effort is normal.     Comments: No cough during visit Neurological:     Mental Status: She is alert and oriented to person, place, and time.  Psychiatric:        Mood and Affect: Mood normal.            Assessment & Plan:

## 2020-08-16 ENCOUNTER — Telehealth: Payer: Self-pay

## 2020-08-16 NOTE — Telephone Encounter (Signed)
[facility] Day - Client TELEPHONE ADVICE RECORD Access[employer]urse Patient Name: Grace [name] Gender: Female DOB: Jun 04, 1961 Age: 59 Y 6 M 6 D Return Phone Number: [phone] (Secondary), [phone] (Primary) Address: [address] City/State/Zip: [place] [address] Client [facility] Day - Client Client Site [facility] - Day Physician Milinda Antis, Idamae Schuller - MD Contact Type Call Who Is Calling Patient / Member / Family / Caregiver Call Type Triage / Clinical Return Phone Number [phone] (Primary) Chief Complaint Unknown Complaint Reason for Call Symptomatic / Request for Health Information Initial Comment 2 of 2 Caller stated he and his wife both tested positive for Covid, both have Dr. Milinda Antis. Nurse Assessment Nurse: Elesa Hacker, RN, Nash Dimmer Date/Time Lamount Cohen Time): 08/15/2020 6:39:56 PM Confirm and document reason for call. If symptomatic, describe symptoms. ---Tested positive today for Covid. Has some drainage and a headache. Temp is 98.0. Has taken Advil. Fully vaccinated and a booster as well. Does the patient have any new or worsening symptoms? ---Yes Will a triage be completed? ---Yes Related visit to physician within the last 2 weeks? ---No Does the PT have any chronic conditions? (i.e. diabetes, asthma, this includes High risk factors for pregnancy, etc.) ---No Is this a behavioral health or substance abuse call? ---No Guidelines Guideline Title Affirmed Question Affirmed Notes Nurse Date/Time (Eastern Time) COVID-19 - Diagnosed or Suspected [1] COVID-19 diagnosed by doctor (or NP/PA) AND [2] mild symptoms (e.g., cough, fever, others) AND [3] no complications or SOB Deaton, RN, Nash Dimmer 08/15/2020 6:52:09 PM Disp. Time Lamount Cohen Time) Disposition Final User 08/15/2020 6:56:12 PM Home Care Yes Deaton, RN, Cory Roughen Disagree/Comply Comply Caller Understands Yes PreDisposition Did not know what to  do PLEASE NOTE: All timestamps contained within this report are represented as [place] Standard Time. CONFIDENTIALTY NOTICE: This fax transmission is intended only for the addressee. It contains information that is legally privileged, confidential or otherwise protected from use or disclosure. If you are not the intended recipient, you are strictly prohibited from reviewing, disclosing, copying using or disseminating any of this information or taking any action in reliance on or regarding this information. If you have received this fax in error, please notify us immediately by telephone so that we can arrange for its return to [place]. Phone: [phone], Toll-Free: [phone], Fax: [phone] Page: 2 of 2 Call Id: [id number] Care Advice Given Per Guideline HOME CARE: * You should be able to treat this at home. REASSURANCE AND EDUCATION - DIAGNOSED WITH COVID-19 BY DOCTOR (OR NP/PA) AND MILD SYMPTOMS: * Your doctor has diagnosed you as having COVID-19 based on your symptoms and COVID-19 testing. * If you have not been tested yet for COVID-19, we recommend that you get tested (probably nose swab) in the next 3 days. * For some people, the symptoms of COVID-19 can be mild, especially if you are healthy and under 38 years old. GENERAL CARE ADVICE FOR COVID-19 SYMPTOMS: * The treatment is the same whether you have COVID-19, influenza or some other respiratory virus. * Cough: Use cough drops. * Feeling dehydrated: Drink extra liquids. If the air in your home is dry, use a humidifier. * Fever: For fever over 101 F (38.3 C), take acetaminophen every 4 to 6 hours (Adults 650 mg) OR ibuprofen every 6 to 8 hours (Adults 400 mg). Before taking any medicine, read all the instructions on the package. Do not take aspirin unless your doctor has prescribed it for you. * Muscle aches, headache, and other  pains: Often this comes and goes with the fever. Take acetaminophen every 4 to 6 hours (Adults 650 mg) OR  ibuprofen every 6 to 8 hours (Adults 400 mg). Before taking any medicine, read all the instructions on the package. * Sore throat: Try throat lozenges, hard candy or warm chicken broth. * Do not try to completely stop coughs that produce mucus and phlegm. HUMIDIFIER: * If the air is dry, use a humidifier in the bedroom. * Dry air makes coughs worse. * ACETAMINOPHEN - EXTRA STRENGTH TYLENOL: Take 1,000 mg (two 500 mg pills) every 8 hours as needed. Each Extra Strength Tylenol pill has 500 mg of acetaminophen. The most you should take each day is 3,000 mg (6 pills a day). * IBUPROFEN (E.G., MOTRIN, ADVIL): Take 400 mg (two 200 mg pills) by mouth every 6 hours. The most you should take each day is 1,200 mg (six 200 mg pills), unless your doctor has told you to take more. HOW TO PROTECT OTHERS - WHEN YOU ARE SICK WITH COVID-19: * STAY HOME A MINIMUM OF 10 DAYS: Home isolation is needed for at least 10 days after the symptoms started. Stay home from school or work if you are sick. Do NOT go to religious services, child care centers, shopping, or other public places. Do NOT use public transportation (e.g., bus, taxis, ride-sharing). Do NOT allow any visitors to your home. Leave the house only if you need to seek urgent medical care. CALL BACK IF: * Fever over 103 F (39.4 C) * Fever lasts over 3 days * Fever returns after being gone for 24 hours * Chest pain or difficulty breathing occurs * You become worse CARE ADVICE given per COVID-19 - DIAGNOSED OR SUSPECTED (Adult) guideline. After Care Instructions Given Call Event Type User Date / Time Description Education document email Elesa Hacker, RN, Nash Dimmer 08/15/2020 6:56:50 PM COVID-19 Diagnosed or Suspected Education document email Deaton, RN, Nash Dimmer 08/15/2020 6:56:50 PM COVID-19 Exposure - No Symptoms Education document email Elesa Hacker, RN, Nash Dimmer 08/15/2020 6:56:50 PM COVID-19 Lowella Dandy Education document email Deaton, RN, Nash Dimmer 08/15/2020 6:56:50 PM  COVID-19 Vaccines - Answers to Common Questions Education document email Deaton, RN, Nash Dimmer 08/15/2020 6:56:50 PM What To Do If You Are Sick With COVID-19_English

## 2020-08-16 NOTE — Telephone Encounter (Signed)
Please reach out to check in  Schedule virtual visit if needed

## 2020-08-16 NOTE — Telephone Encounter (Signed)
I left a detailed message on patient's voice mail asking her if she'd like to schedule a virtual visit, to call back and schedule appointment.

## 2020-10-31 ENCOUNTER — Ambulatory Visit: Payer: BC Managed Care – PPO | Admitting: Family Medicine

## 2020-10-31 ENCOUNTER — Other Ambulatory Visit: Payer: Self-pay

## 2020-10-31 ENCOUNTER — Encounter: Payer: Self-pay | Admitting: Family Medicine

## 2020-10-31 DIAGNOSIS — R9389 Abnormal findings on diagnostic imaging of other specified body structures: Secondary | ICD-10-CM | POA: Diagnosis not present

## 2020-10-31 LAB — T3, FREE: T3, Free: 8.7 pg/mL — ABNORMAL HIGH (ref 2.3–4.2)

## 2020-10-31 LAB — T4, FREE: Free T4: 1.49 ng/dL (ref 0.60–1.60)

## 2020-10-31 LAB — TSH: TSH: 2.61 u[IU]/mL (ref 0.35–4.50)

## 2020-10-31 NOTE — Assessment & Plan Note (Signed)
Incidental finding of calcium deposits in thyroid area on cervical xray  Family hx of thyroid problems  Lab today  Ref for thyroid US Plan to follow   Lab Orders     TSH     T4, free     T3, Free

## 2020-10-31 NOTE — Patient Instructions (Signed)
The office will call you about a thyroid ultrasound referral   Thyroid labs today

## 2020-10-31 NOTE — Progress Notes (Signed)
Subjective:    Patient ID: Grace [name], female    DOB: 11-28-1960, 60 y.o.   MRN: [id number]  This visit occurred during the SARS-CoV-2 public health emergency.  Safety protocols were in place, including screening questions prior to the visit, additional usage of staff PPE, and extensive cleaning of exam room while observing appropriate contact time as indicated for disinfecting solutions.    HPI Pt presents for thyroid concern   Wt Readings from Last 3 Encounters:  10/31/20 149 lb 2 oz (67.6 kg)  06/28/20 147 lb (66.7 kg)  10/14/18 147 lb (66.7 kg)   25.60 kg/m  Pt was seen at [facility] for neck pain and cervical radiculopathy   Calcification was noted in thyroid area on xray  She has fam hx of thyroid problems   Mother had hypothyroidism Brother with hyperthyroidism  Brother had 1/2 thyroid removed (no cancer)  Cousin with thyroid cancer    Lab Results  Component Value Date   TSH 1.47 12/13/2014    No changes in how she is feeling except muscle pain in her neck   Came off estrogen patch  A little sluggish from that  Does not feel lump in her neck  She has to chew very well to swallow Some pnd from allergies   Is trying to drink water more now that she is home   Just retired in feb   Patient Active Problem List   Diagnosis Date Noted  . Abnormal imaging of thyroid 10/31/2020  . DDD (degenerative disc disease), cervical 03/06/2018  . TMJ tenderness, right 03/06/2018  . Palpitations 10/29/2014  . NONSPEC ELEVATION OF LEVELS OF TRANSAMINASE/LDH 08/25/2010  . Headache 04/10/2010  . MENOPAUSAL SYNDROME 07/12/2009  . RHINITIS 09/16/2008   Past Medical History:  Diagnosis Date  . Allergic rhinitis, cause unspecified   . Back pain    epidural injections  . Foot fracture   . Headache(784.0)   . Nonspecific elevation of levels of transaminase or lactic acid dehydrogenase (LDH)   . Pain in joint, site unspecified   . Symptomatic menopausal or female  climacteric states   . Unspecified gastritis and gastroduodenitis without mention of hemorrhage   . Unspecified sinusitis (chronic)    Past Surgical History:  Procedure Laterality Date  . CCY  2006  . COLONOSCOPY    . ESOPHAGOGASTRODUODENOSCOPY  2006   gastritis  . FOOT FRACTURE SURGERY  2010   Social History   Tobacco Use  . Smoking status: Never Smoker  . Smokeless tobacco: Never Used  Substance Use Topics  . Alcohol use: No  . Drug use: No   Family History  Problem Relation Age of Onset  . Other Father        disk disease  . Arthritis Unknown        paternal side  . Other Brother        disk disease   Allergies  Allergen Reactions  . Augmentin [Amoxicillin-Pot Clavulanate] Diarrhea  . Celecoxib     REACTION: rash  . Codeine     REACTION: rash  . Morphine     REACTION: u/k  . Propoxyphene N-Acetaminophen     REACTION: u/k  . Tramadol Hcl     REACTION: u/k  . Pneumococcal Vaccines Rash    Local reaction on arm. Redness swelling and rash  . Sulfa Antibiotics Rash   Current Outpatient Medications on File Prior to Visit  Medication Sig Dispense Refill  . baclofen (LIORESAL) 10 MG tablet  Take 10 mg by mouth at bedtime as needed for muscle spasms.    . Calcium Carbonate-Vitamin D (CALCIUM-D PO) Take 1 tablet by mouth daily.    . hydrochlorothiazide (HYDRODIURIL) 25 MG tablet Take 25 mg by mouth daily.    Marland Kitchen latanoprost (XALATAN) 0.005 % ophthalmic solution Place 1 drop into both eyes at bedtime.    . Multiple Vitamin (MULTIVITAMIN) tablet Take 1 tablet by mouth daily.    . timolol (BETIMOL) 0.5 % ophthalmic solution Place 1 drop into both eyes in the morning.     No current facility-administered medications on file prior to visit.    Review of Systems  Constitutional: Positive for fatigue. Negative for activity change, appetite change, fever and unexpected weight change.  HENT: Negative for congestion, ear pain, rhinorrhea, sinus pressure and sore throat.    Eyes: Negative for pain, redness and visual disturbance.  Respiratory: Negative for cough, shortness of breath and wheezing.   Cardiovascular: Negative for chest pain and palpitations.  Gastrointestinal: Negative for abdominal pain, blood in stool, constipation and diarrhea.  Endocrine: Negative for polydipsia and polyuria.  Genitourinary: Negative for dysuria, frequency and urgency.  Musculoskeletal: Negative for arthralgias, back pain and myalgias.  Skin: Negative for pallor and rash.  Allergic/Immunologic: Negative for environmental allergies.  Neurological: Negative for dizziness, syncope and headaches.  Hematological: Negative for adenopathy. Does not bruise/bleed easily.  Psychiatric/Behavioral: Negative for decreased concentration and dysphoric mood. The patient is not nervous/anxious.        Objective:   Physical Exam Constitutional:      General: She is not in acute distress.    Appearance: Normal appearance. She is normal weight. She is not ill-appearing.  HENT:     Mouth/Throat:     Mouth: Mucous membranes are moist.     Pharynx: Oropharynx is clear.  Eyes:     Conjunctiva/sclera: Conjunctivae normal.     Pupils: Pupils are equal, round, and reactive to light.  Neck:     Vascular: No carotid bruit.     Comments: No goiter/thyroid enlargement  No thyroid tenderness or bruit Cardiovascular:     Rate and Rhythm: Normal rate and regular rhythm.     Heart sounds: Normal heart sounds.  Pulmonary:     Effort: Pulmonary effort is normal. No respiratory distress.     Breath sounds: Normal breath sounds.  Musculoskeletal:     Cervical back: Normal range of motion and neck supple. No tenderness.     Right lower leg: No edema.     Left lower leg: No edema.  Lymphadenopathy:     Cervical: No cervical adenopathy.  Skin:    Coloration: Skin is not pale.     Findings: No erythema or rash.  Neurological:     Mental Status: She is alert.     Deep Tendon Reflexes: Reflexes  normal.     Comments: No tremor   Psychiatric:        Mood and Affect: Mood normal.           Assessment & Plan:   Problem List Items Addressed This Visit      Other   Abnormal imaging of thyroid    Incidental finding of calcium deposits in thyroid area on cervical xray  Family hx of thyroid problems  Lab today  Ref for thyroid US Plan to follow   Lab Orders     TSH     T4, free     T3, Free  Relevant Orders   TSH (Completed)   T4, free (Completed)   T3, Free (Completed)   US THYROID

## 2020-11-10 ENCOUNTER — Ambulatory Visit
Admission: RE | Admit: 2020-11-10 | Discharge: 2020-11-10 | Disposition: A | Discharge status: Home / Self Care | Payer: BC Managed Care – PPO | Source: Ambulatory Visit | Attending: Family Medicine | Admitting: Family Medicine

## 2020-11-10 DIAGNOSIS — R9389 Abnormal findings on diagnostic imaging of other specified body structures: Secondary | ICD-10-CM

## 2020-11-10 IMAGING — US US THYROID
1 series · 14 of 25 positions shown · non-contrast
Comparison: None.

CLINICAL DATA: Thyroid calcification noted on x-ray

EXAM:
THYROID ULTRASOUND
TECHNIQUE: Ultrasound examination of the thyroid gland and adjacent soft
tissues was performed.

[Series 1: us thyroid · 0.04mm/px · 14 of 34 slices shown]
[im 1/34]
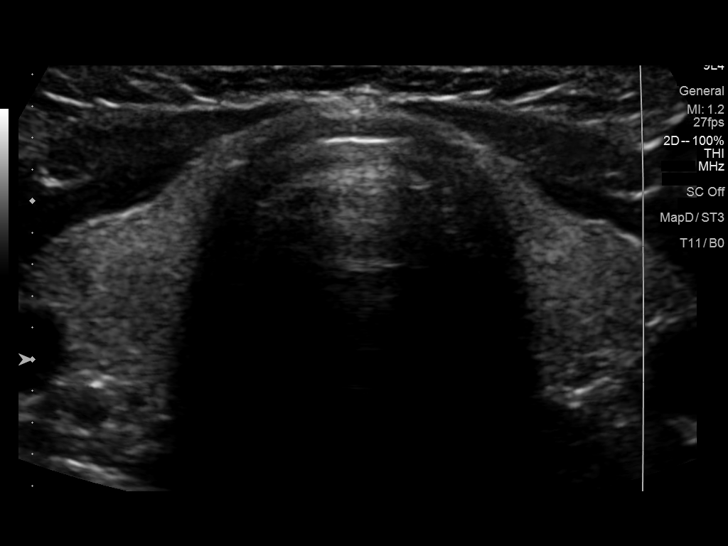
[im 3/34]
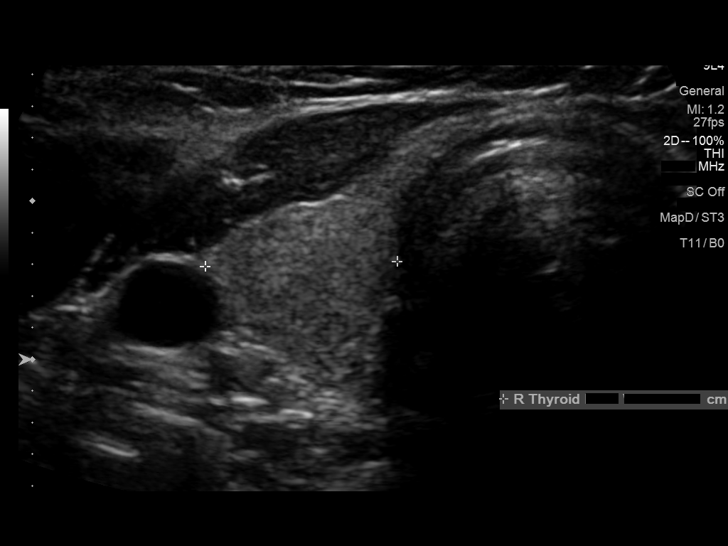
[im 6/34]
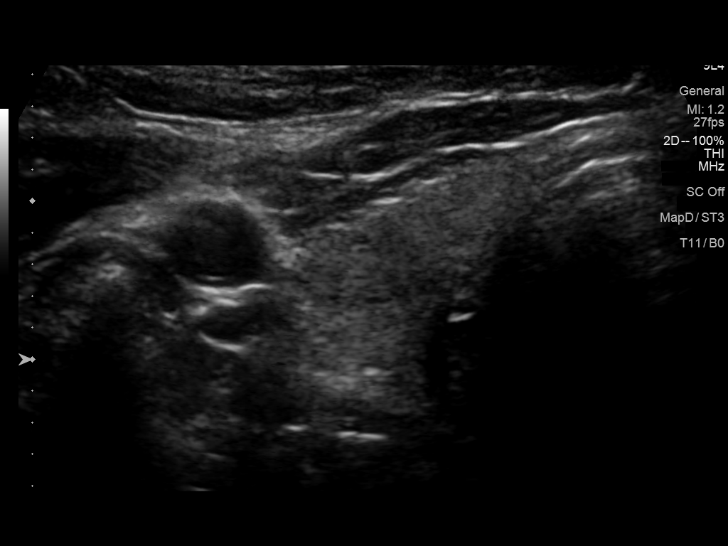
[im 9/34]
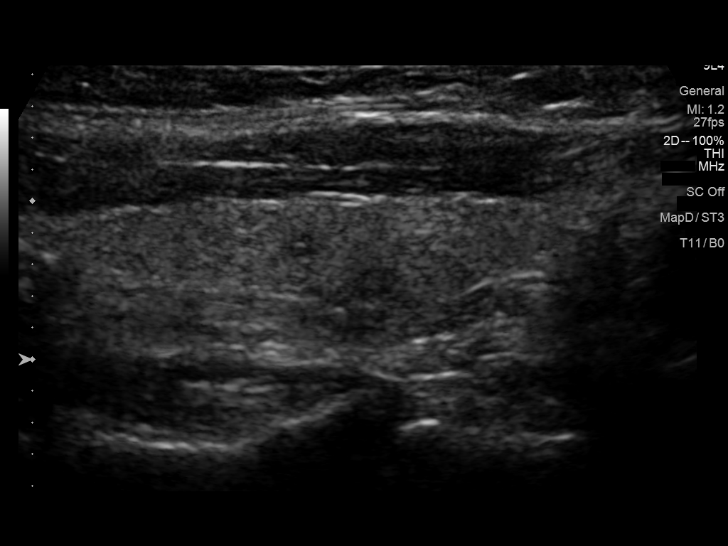
[im 12/34]
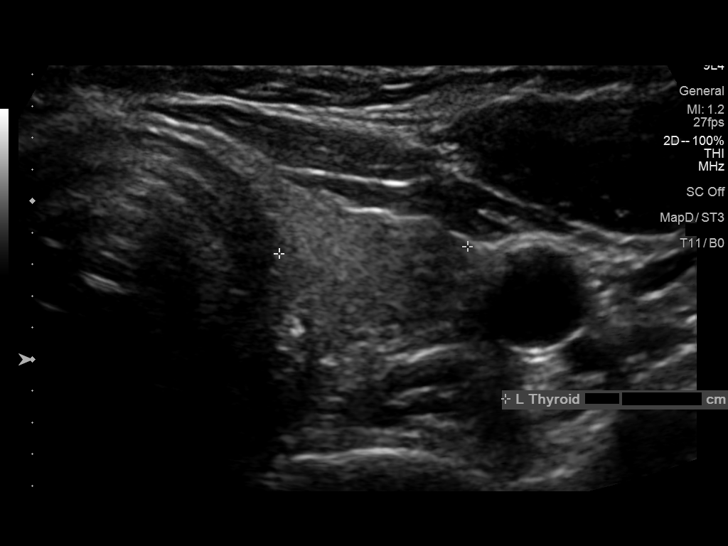
[im 13/34]
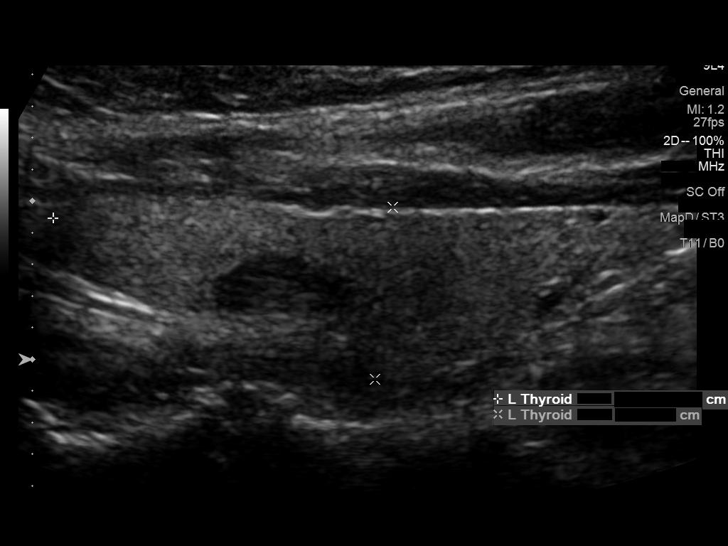
[im 16/34]
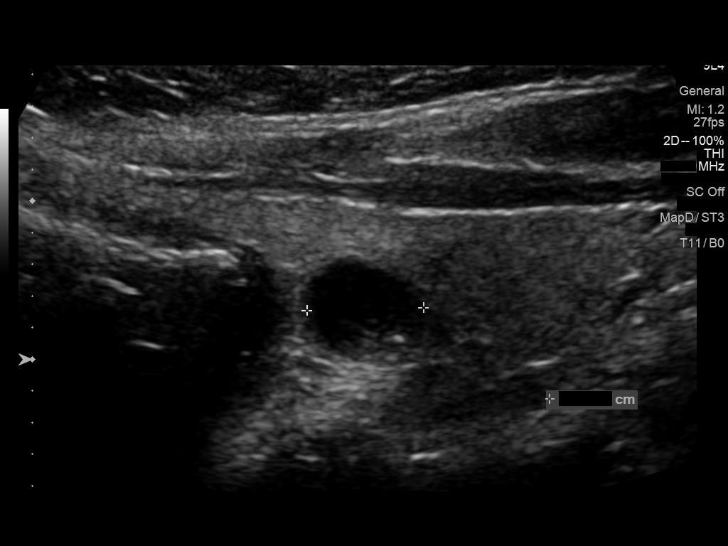
[im 18/34]
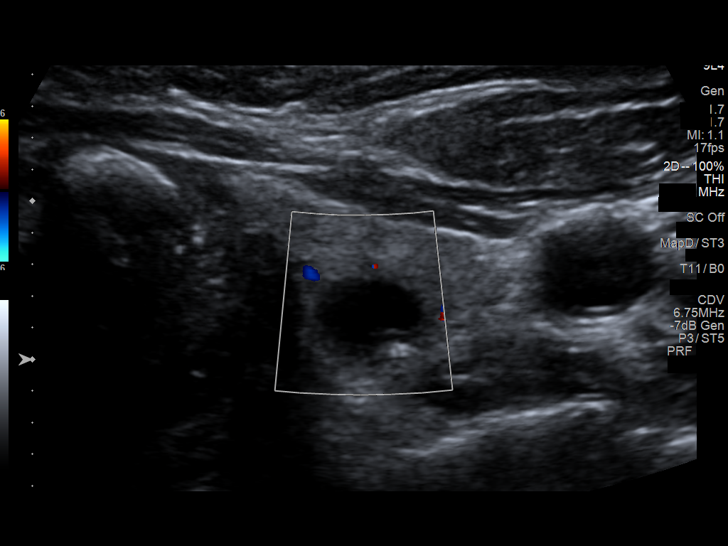
[im 21/34]
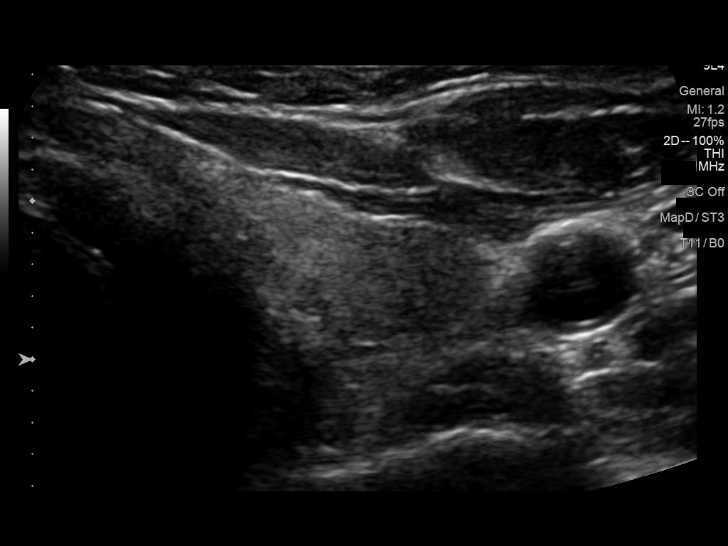
[im 23/34]
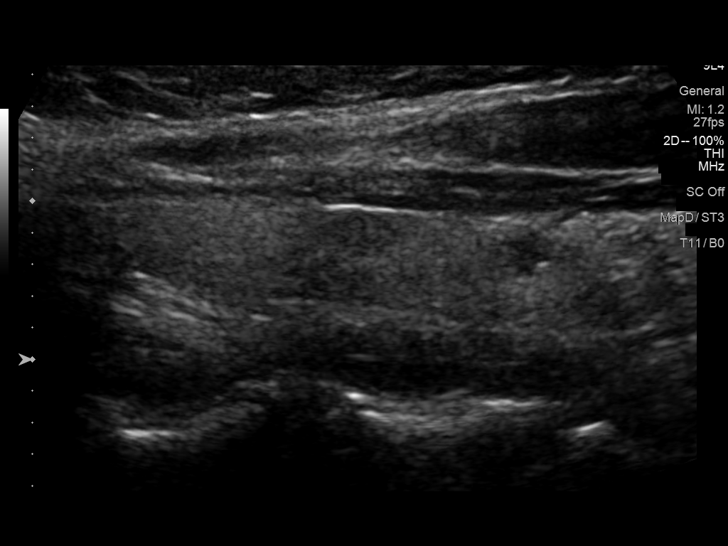
[im 25/34]
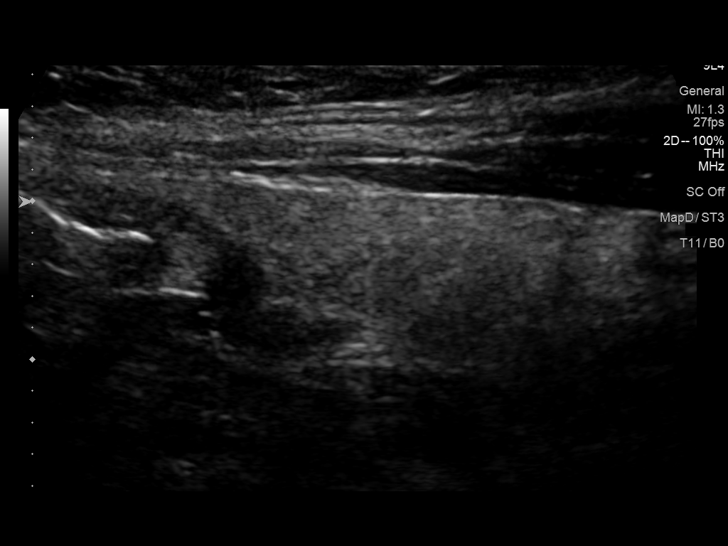
[im 28/34]
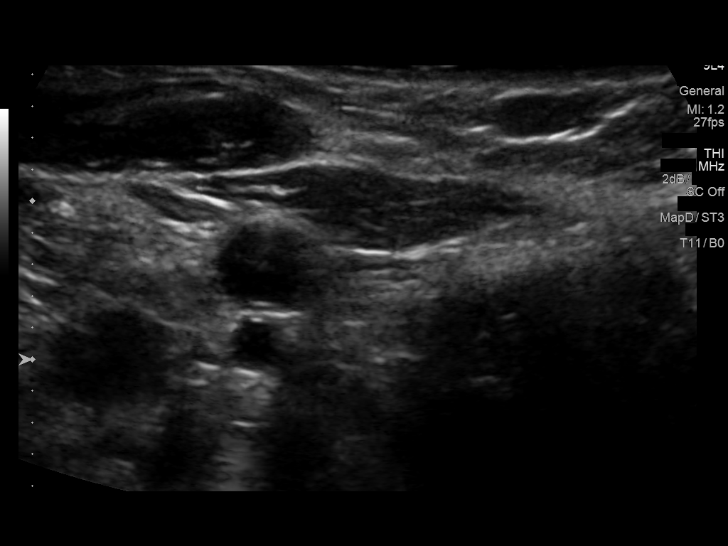
[im 31/34]
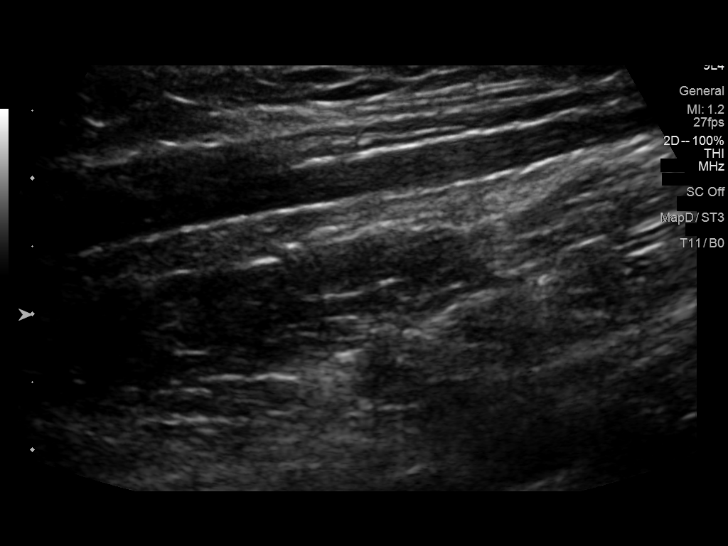
[im 34/34]
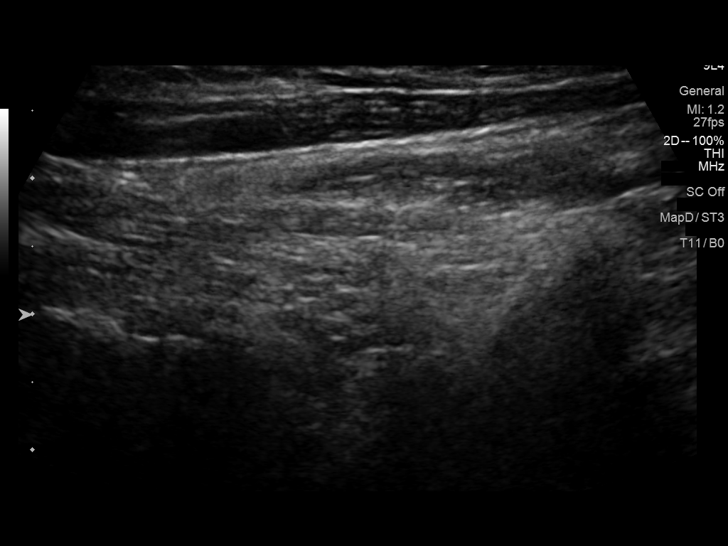

[14 of 25 positions shown; findings below may reference images not displayed]

FINDINGS: Parenchymal Echotexture: Normal

Isthmus: 0.3 cm

Right lobe: 4.0 x 1.1 x 1.2 cm

Left lobe: 4.0 x 1.1 x 1.2 cm

_________________________________________________________

Estimated total number of nodules >/= 1 cm: 0

Number of spongiform nodules >/=  2 cm not described below (TR1): 0

Number of mixed cystic and solid nodules >/= 1.5 cm not described
below (TR2): 0

_________________________________________________________

0.7 x 0.7 x 0.6 cm predominantly cystic nodule in the mid left
thyroid lobe does not meet criteria for FNA or imaging follow-up.

Additional solid 0.3 and 0.4 cm left thyroid nodule does not meet
criteria for FNA or imaging follow-up.
IMPRESSION: No suspicious thyroid nodules.

The above is in keeping with the ACR TI-RADS recommendations - [HOSPITAL] [9I];[DATE].

## 2020-11-11 ENCOUNTER — Telehealth: Payer: Self-pay | Admitting: *Deleted

## 2020-11-11 NOTE — Telephone Encounter (Signed)
Pt called because she could see her thyroid US results were back on mychart. Pt said she did view her lab also and saw the T3, Free was very elevated. Pt wanted to know what the "plan" is now that her [place] is back and it looks normal.  Pt aware PCP is out of the office today and is okay with a call back on Monday if needed

## 2020-11-12 NOTE — Telephone Encounter (Signed)
Please see result note, thanks!

## 2020-11-14 ENCOUNTER — Telehealth: Payer: Self-pay | Admitting: Family Medicine

## 2020-11-14 DIAGNOSIS — R7989 Other specified abnormal findings of blood chemistry: Secondary | ICD-10-CM

## 2020-11-14 DIAGNOSIS — R9389 Abnormal findings on diagnostic imaging of other specified body structures: Secondary | ICD-10-CM

## 2020-11-14 NOTE — Telephone Encounter (Signed)
Referral is done

## 2020-11-14 NOTE — Telephone Encounter (Signed)
Addressed through result notes  

## 2020-11-14 NOTE — Telephone Encounter (Signed)
-----   Message from Shon Millet, [place] sent at 11/14/2020 10:47 AM EDT ----- Pt notified of [place] results and Dr. Royden Purl comments. Pt said she does want to see Endo, she looked up sxs of thyroid issues and she's having a few, trouble sleeping, "racing heart beat" feeling. Pt said [place] is fine for location. I advise pt PCP will put referral in and our [facility] will call to schedule appt

## 2020-12-21 ENCOUNTER — Encounter: Payer: Self-pay | Admitting: Internal Medicine

## 2020-12-21 ENCOUNTER — Ambulatory Visit: Payer: BC Managed Care – PPO | Admitting: Internal Medicine

## 2020-12-21 ENCOUNTER — Other Ambulatory Visit: Payer: Self-pay

## 2020-12-21 VITALS — BP 130/84 | HR 100 | Ht 64.0 in | Wt 144.0 lb

## 2020-12-21 DIAGNOSIS — E042 Nontoxic multinodular goiter: Secondary | ICD-10-CM

## 2020-12-21 NOTE — Patient Instructions (Addendum)
-   I have reviewed your thyroid ultrasound and I don't see any concerning calcifications. I suspect these calcifications are  In the  neck circulation   Please make sure to hold Biotin 2-3 days prior to any thyroid checks in the future

## 2020-12-21 NOTE — Progress Notes (Signed)
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 is a 60 y.o. female with a past medical history of DJD. The patient presented for initial endocrinology clinic visit on 12/21/2020 for consultative assistance with her MNG.   She was noted to have calcium deposits in the thyroid area on cervical X-ray , which prompted thyroid ultrasound showing sub-centimeter nodules that do not require further follow up.   She follows with orthopedics for neck and back pain.   Her TSH and FT4 are normal but T3 was slightly elevated at 8.7 pg/mL   She is S/P hysterectomy years ago and was on  HRT for 6 yrs . She was taken off by Gyn and was started on Vaginal estrogen suppositories but have been having vaginal infections  Takes magnesium at night to help with neck spasms  Follows with Physician for women's   Takes Biotin   Pt has FH of thyroid disease   HISTORY:  Past Medical History:  Past Medical History:  Diagnosis Date  . Allergic rhinitis, cause unspecified   . Back pain    epidural injections  . Foot fracture   . Headache(784.0)   . Nonspecific elevation of levels of transaminase or lactic acid dehydrogenase (LDH)   . Pain in joint, site unspecified   . Symptomatic menopausal or female climacteric states   . Unspecified gastritis and gastroduodenitis without mention of hemorrhage   . Unspecified sinusitis (chronic)    Past Surgical History:  Past Surgical History:  Procedure Laterality Date  . CCY  2006  . COLONOSCOPY    . ESOPHAGOGASTRODUODENOSCOPY  2006   gastritis  . FOOT FRACTURE SURGERY  2010      Social History:  reports that she has never smoked. She has never used smokeless tobacco. She reports that she does not drink alcohol and does not use drugs.  Family History: family history  includes Arthritis in her unknown relative; Other in her brother and father.   HOME MEDICATIONS: Allergies as of 12/21/2020      Reactions   Augmentin [amoxicillin-pot Clavulanate] Diarrhea   Celecoxib    REACTION: rash   Codeine    REACTION: rash   Morphine    REACTION: u/k   Propoxyphene N-acetaminophen    REACTION: u/k   Tramadol Hcl    REACTION: u/k   Pneumococcal Vaccines Rash   Local reaction on arm. Redness swelling and rash   Sulfa Antibiotics Rash      Medication List       Accurate as of Dec 21, 2020  8:27 AM. If you have any questions, ask your nurse or doctor.        baclofen 10 MG tablet Commonly known as: LIORESAL Take 10 mg by mouth at bedtime as needed for muscle spasms.   CALCIUM-D PO Take 1 tablet by mouth daily.   hydrochlorothiazide 25 MG tablet Commonly known as: HYDRODIURIL Take 25 mg by mouth daily.   Imvexxy Maintenance Pack 10 MCG Inst Generic drug: Estradiol SMARTSIG:1 Insert Vaginal Twice a Week   latanoprost 0.005 % ophthalmic solution Commonly known as: XALATAN Place 1 drop into both eyes at bedtime.   multivitamin tablet Take 1 tablet by mouth daily.   timolol 0.5 % ophthalmic solution Commonly  known as: BETIMOL Place 1 drop into both eyes in the morning.   traMADol 50 MG tablet Commonly known as: ULTRAM Take 50 mg by mouth 3 (three) times daily as needed.         REVIEW OF SYSTEMS: A comprehensive ROS was conducted with the patient and is negative except as per HPI    OBJECTIVE:  VS: BP 130/84   Pulse 100   Ht 5\' 4"  (1.626 m)   Wt 144 lb (65.3 kg)   LMP 08/20/1988   SpO2 97%   BMI 24.72 kg/m    Wt Readings from Last 3 Encounters:  12/21/20 144 lb (65.3 kg)  10/31/20 149 lb 2 oz (67.6 kg)  06/28/20 147 lb (66.7 kg)     EXAM: General: Pt appears well and is in NAD  Neck: General: Supple without adenopathy. Thyroid: Thyroid size normal.  No goiter or nodules appreciated.  Lungs: Clear with good BS bilat  with no rales, rhonchi, or wheezes  Heart: Auscultation: RRR.  Abdomen: Normoactive bowel sounds, soft, nontender, without masses or organomegaly palpable  Extremities:  BL LE: No pretibial edema normal ROM and strength.  Skin: Hair: Texture and amount normal with gender appropriate distribution Skin Inspection: No rashes Skin Palpation: Skin temperature, texture, and thickness normal to palpation  Mental Status: Judgment, insight: Intact Orientation: Oriented to time, place, and person Memory: Intact for recent and remote events Mood and affect: No depression, anxiety, or agitation     DATA REVIEWED: Results for Grace, [name] (MRN Grace [name]) as of 12/21/2020 12:22  Ref. Range 10/31/2020 13:03  TSH Latest Ref Range: 0.35 - 4.50 uIU/mL 2.61  Triiodothyronine,Free,Serum Latest Ref Range: 2.3 - 4.2 pg/mL 8.7 (H)  T4,Free(Direct) Latest Ref Range: 0.60 - 1.60 ng/dL 11/02/2020    Thyroid ultrasound 11/10/2020 Parenchymal Echotexture: Normal  Isthmus: 0.3 cm  Right lobe: 4.0 x 1.1 x 1.2 cm  Left lobe: 4.0 x 1.1 x 1.2 cm  _________________________________________________________  Estimated total number of nodules >/= 1 cm: 0  Number of spongiform nodules >/=  2 cm not described below (TR1): 0  Number of mixed cystic and solid nodules >/= 1.5 cm not described below (TR2): 0  _________________________________________________________  0.7 x 0.7 x 0.6 cm predominantly cystic nodule in the mid left thyroid lobe does not meet criteria for FNA or imaging follow-up.  Additional solid 0.3 and 0.4 cm left thyroid nodule does not meet criteria for FNA or imaging follow-up.  IMPRESSION: No suspicious thyroid nodules. ASSESSMENT/PLAN/RECOMMENDATIONS:   1. MNG:  - Sub-centimeter nodules. Reassurance provided, no serial ultrasound recommended unless there's a clinical concern of increase in size of nodules - She is on Biotin, she was advised to hold it 2-3 days prior to any future  thyroid function testing    2. Neck Calcifications:  - Images of the X-ray are not available but the thyroid ultrasound does not show any concerning calcification. I explained to her that these calcifications could be of vascular deposits which is beyond the scope of endocrinology  - But I am going to check lipid panel next week  - She was advised not to consume more then 1200 mg of calcium daily    F/U PRN   Signed electronically by: 11/12/2020, MD  [facility] Endocrinology  [facility] [address] [place], [place] Phone: [phone] FAX: [phone]   CC: [place], [phone], MD [address] [place] Phone: [phone] Fax:  [phone]   Return to Endocrinology clinic as below: No future appointments.

## 2020-12-26 ENCOUNTER — Other Ambulatory Visit: Payer: Self-pay

## 2020-12-26 ENCOUNTER — Other Ambulatory Visit (INDEPENDENT_AMBULATORY_CARE_PROVIDER_SITE_OTHER): Payer: BC Managed Care – PPO

## 2020-12-26 DIAGNOSIS — E042 Nontoxic multinodular goiter: Secondary | ICD-10-CM

## 2020-12-26 LAB — LIPID PANEL
Cholesterol: 222 mg/dL — ABNORMAL HIGH (ref 0–200)
HDL: 72.1 mg/dL (ref >39.00)
LDL Cholesterol: 135 mg/dL — ABNORMAL HIGH (ref 0–99)
NonHDL: 149.54
Total CHOL/HDL Ratio: 3
Triglycerides: 74 mg/dL (ref 0.0–149.0)
VLDL: 14.8 mg/dL (ref 0.0–40.0)

## 2020-12-26 LAB — T3, FREE: T3, Free: 3.5 pg/mL (ref 2.3–4.2)

## 2020-12-26 LAB — T4, FREE: Free T4: 0.96 ng/dL (ref 0.60–1.60)

## 2020-12-26 LAB — TSH: TSH: 2.12 u[IU]/mL (ref 0.35–4.50)

## 2020-12-27 ENCOUNTER — Other Ambulatory Visit: Payer: Self-pay | Admitting: Chiropractic Medicine

## 2020-12-27 DIAGNOSIS — M542 Cervicalgia: Secondary | ICD-10-CM

## 2020-12-30 ENCOUNTER — Ambulatory Visit
Admission: RE | Admit: 2020-12-30 | Discharge: 2020-12-30 | Disposition: A | Discharge status: Home / Self Care | Payer: BC Managed Care – PPO | Source: Ambulatory Visit | Attending: Chiropractic Medicine | Admitting: Chiropractic Medicine

## 2020-12-30 ENCOUNTER — Other Ambulatory Visit: Payer: Self-pay

## 2020-12-30 DIAGNOSIS — M542 Cervicalgia: Secondary | ICD-10-CM

## 2020-12-30 IMAGING — MR MR CERVICAL SPINE W/O CM
5 series · 36 of 48 positions shown · non-contrast
Comparison: None.

CLINICAL DATA: Neck pain and burning in shoulders.

EXAM:
MRI CERVICAL SPINE WITHOUT CONTRAST
TECHNIQUE: Multiplanar, multisequence MR imaging of the cervical spine was
performed. No intravenous contrast was administered.

[Series 4: T2 · sagittal · 3.0mm · 0.41mm/px · 8 of 17 slices shown (1 of 2)]
[im 1/17]
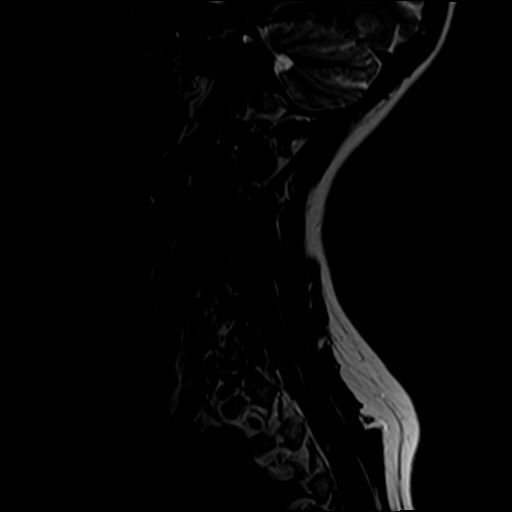
[im 3/17]
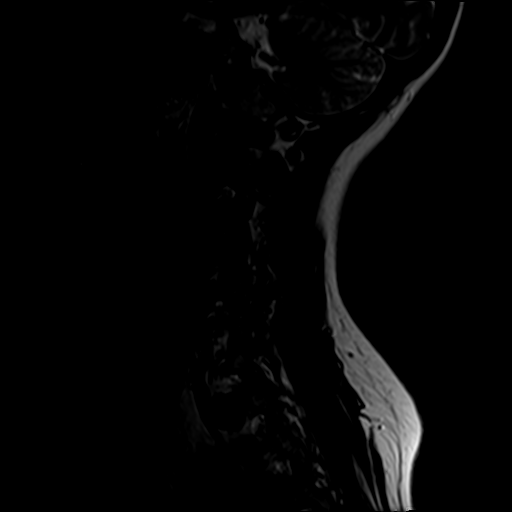
[im 5/17]
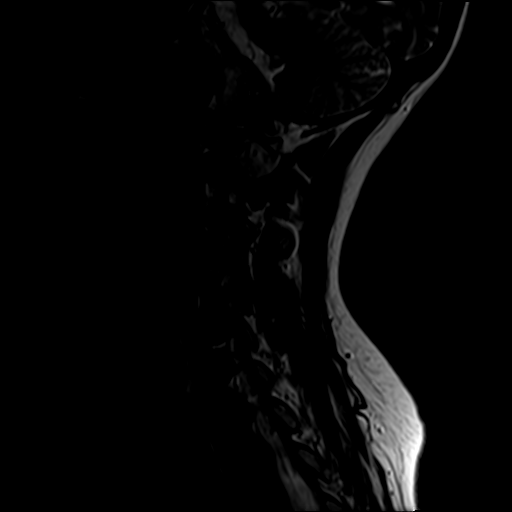
[im 7/17]
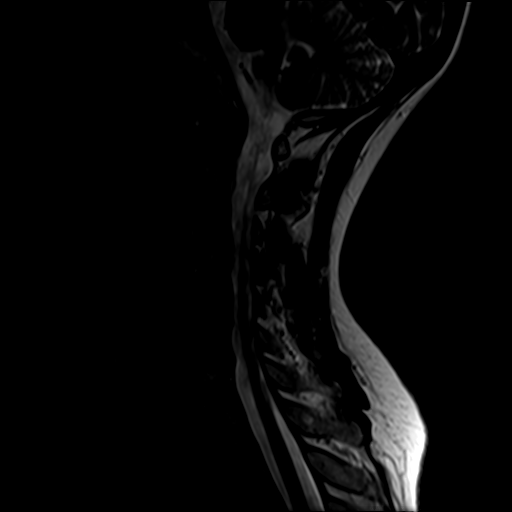
[im 10/17]
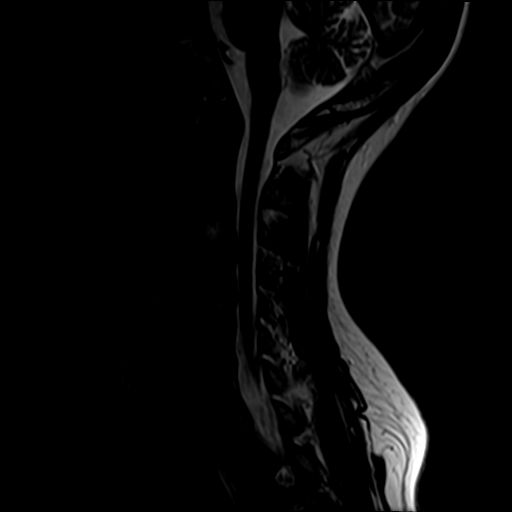
[im 12/17]
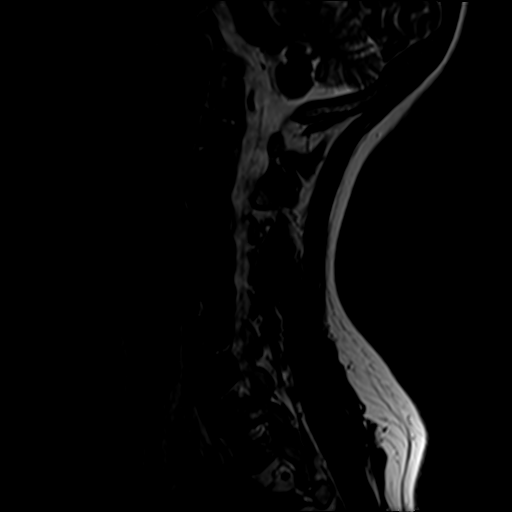
[im 14/17]
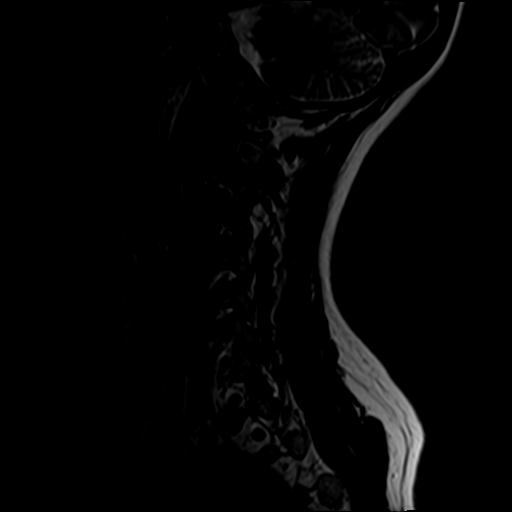
[im 17/17]
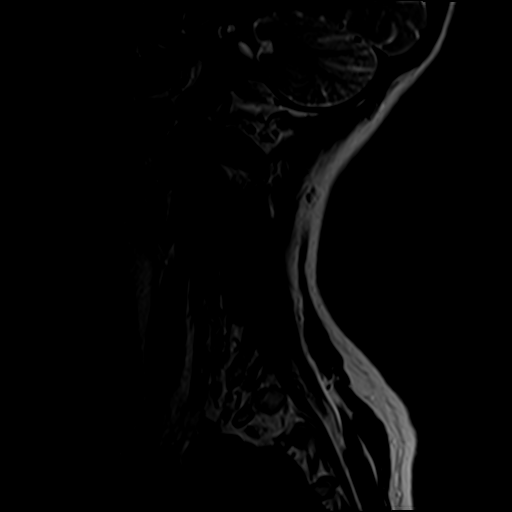

[Series 5: STIR · sagittal · 3.0mm · 0.82mm/px · 8 of 17 slices shown]
[im 1/17]
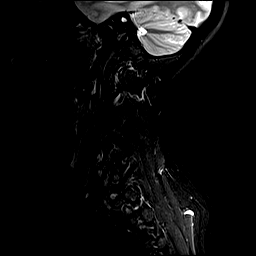
[im 3/17]
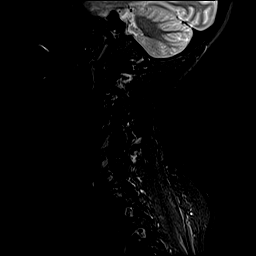
[im 5/17]
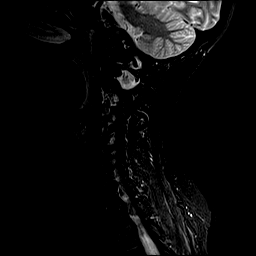
[im 7/17]
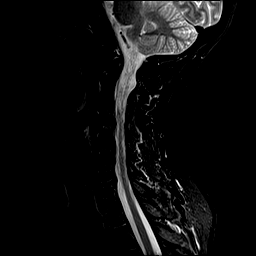
[im 10/17]
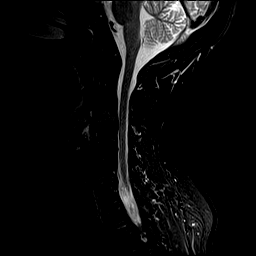
[im 12/17]
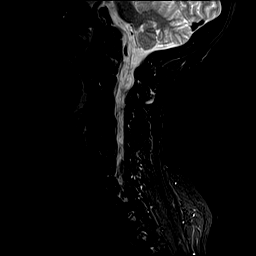
[im 14/17]
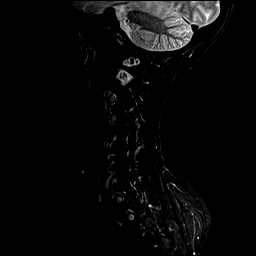
[im 17/17]
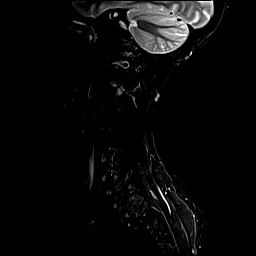

[Series 6: T1 · sagittal · 3.0mm · 0.82mm/px · 8 of 17 slices shown]
[im 1/17]
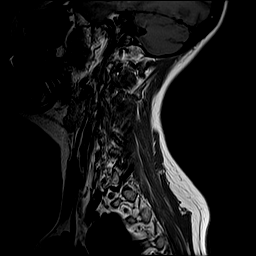
[im 3/17]
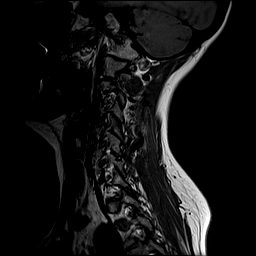
[im 5/17]
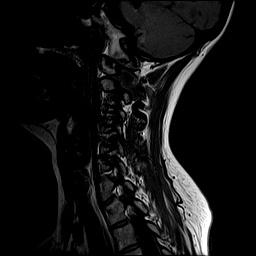
[im 7/17]
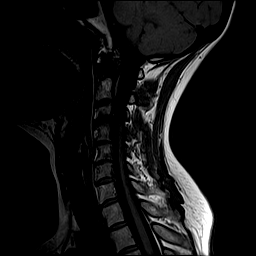
[im 10/17]
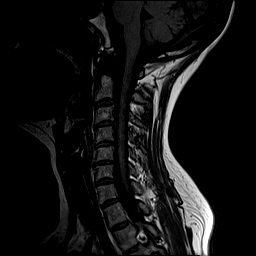
[im 12/17]
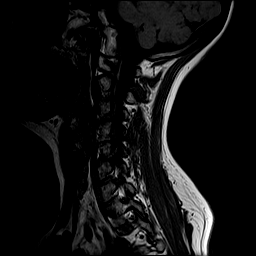
[im 14/17]
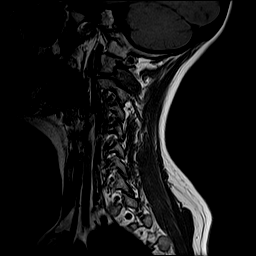
[im 17/17]
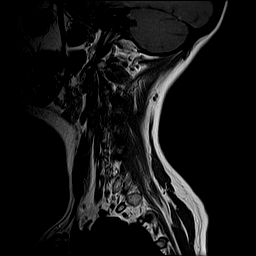

[Series 7: T2 · axial · 3.0mm · 0.70mm/px · z∈[-262,-171]mm · 9 of 26 slices shown (2 of 2)]
[im 1/26]
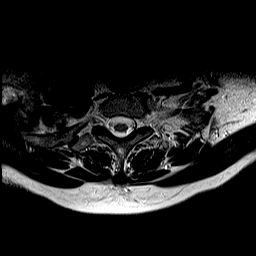
[im 5/26]
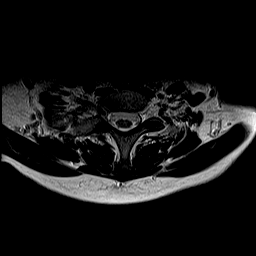
[im 7/26]
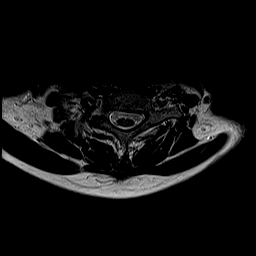
[im 12/26]
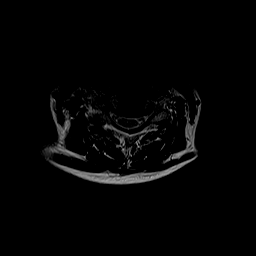
[im 14/26]
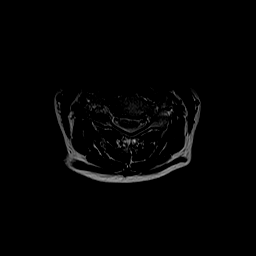
[im 19/26]
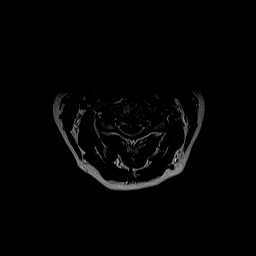
[im 21/26]
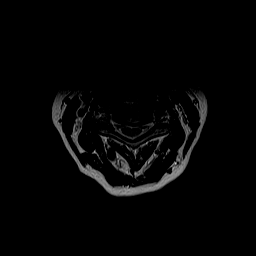
[im 23/26]
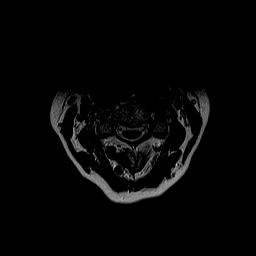
[im 26/26]
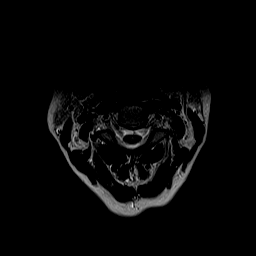

[Series 8: GRE · axial · 3.0mm · 0.35mm/px · z∈[-262,-240]mm · 3 of 26 slices shown]
[im 1/26]
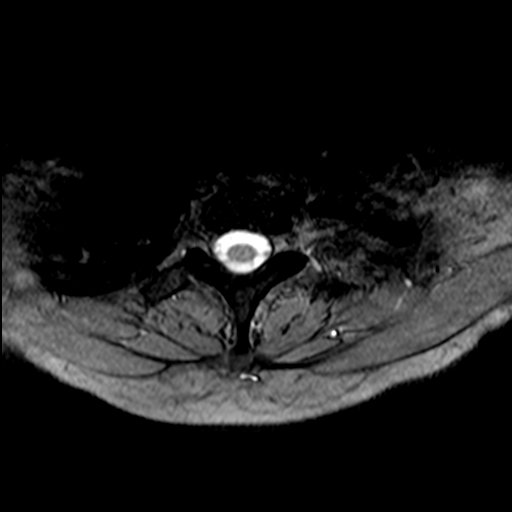
[im 5/26]
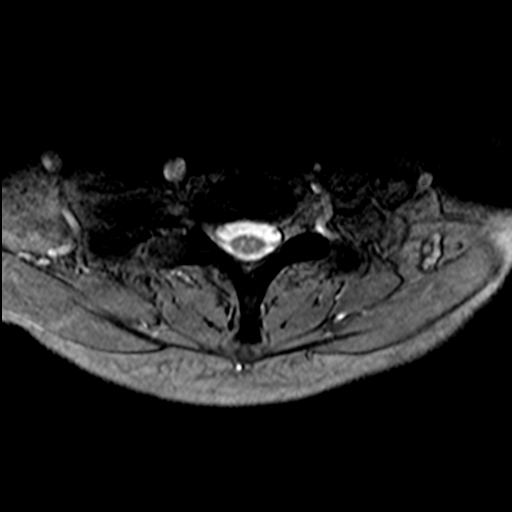
[im 7/26]
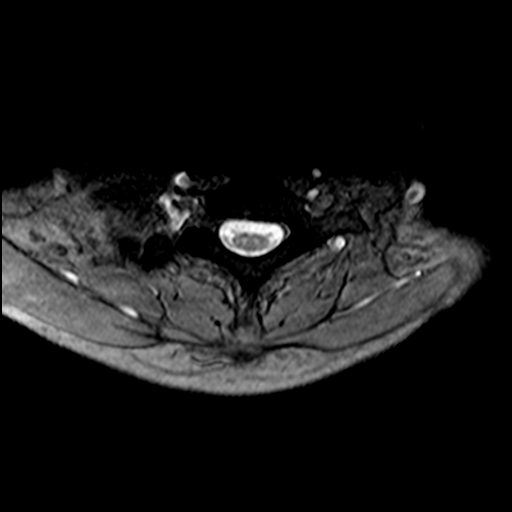

[36 of 48 positions shown; findings below may reference images not displayed]

FINDINGS: Alignment: Straightening of lordosis.

Vertebrae: Minimal Modic type 2 endplate degenerative changes. No
fracture or aggressive osseous lesion.

Cord: Normal signal and morphology.

Posterior Fossa, vertebral arteries: Negative.

Disc levels: Multilevel desiccation.

C2-3: Shallow central bulge. Patent spinal canal and neural foramen.

C3-4: Small central protrusion with uncovertebral and facet
degenerative spurring. Mild spinal canal and bilateral neural
foraminal narrowing.

C4-5: Tiny left subarticular protrusion with uncovertebral and facet
degenerative spurring. Mild spinal canal narrowing. Patent neural
foramen.

C5-6: Minimal disc bulge with uncovertebral and facet degenerative
spurring. Mild spinal canal and bilateral neural foraminal
narrowing.

C6-7: Tiny right subarticular protrusion. Uncovertebral and facet
degenerative spurring. Patent spinal canal and left neural foramen.
Minimal to mild right neural foraminal narrowing.

C7-T1: No significant disc bulge. Patent spinal canal and neural
foramen.

Paraspinal tissues: Negative.
IMPRESSION: Mild spinal canal and bilateral neural foraminal narrowing at the
C3-4, C5-6 levels.

Mild spinal canal narrowing at C4-5. Mild right C6-7 neural
foraminal narrowing.

## 2021-02-17 ENCOUNTER — Ambulatory Visit: Payer: BC Managed Care – PPO | Admitting: Family Medicine

## 2021-02-17 ENCOUNTER — Encounter: Payer: Self-pay | Admitting: Family Medicine

## 2021-02-17 ENCOUNTER — Other Ambulatory Visit: Payer: Self-pay

## 2021-02-17 VITALS — BP 151/80 | HR 90 | Temp 97.2°F | Ht 64.0 in | Wt 138.0 lb

## 2021-02-17 DIAGNOSIS — G8929 Other chronic pain: Secondary | ICD-10-CM | POA: Diagnosis not present

## 2021-02-17 DIAGNOSIS — F411 Generalized anxiety disorder: Secondary | ICD-10-CM | POA: Diagnosis not present

## 2021-02-17 NOTE — Patient Instructions (Addendum)
Check out the Calm and Headspace apps on your phone to learn to meditate   Keep walking  Eat healthy, drink water  Continue cymbalta  I will place a referral for counseling-you will get a call for that   Take care of yourself   If any intolerable side effects from cymbalta that don't improve, then stop it Also if you feel more anxious or depressed

## 2021-02-17 NOTE — Progress Notes (Signed)
Subjective:    Patient ID: Grace [name], female    DOB: 1960-09-19, 60 y.o.   MRN: [id number]  This visit occurred during the SARS-CoV-2 public health emergency.  Safety protocols were in place, including screening questions prior to the visit, additional usage of staff PPE, and extensive cleaning of exam room while observing appropriate contact time as indicated for disinfecting solutions.   HPI Pt presents to discuss treatment for mood   Wt Readings from Last 3 Encounters:  02/17/21 138 lb (62.6 kg)  12/21/20 144 lb (65.3 kg)  10/31/20 149 lb 2 oz (67.6 kg)   23.69 kg/m  Has been through a lot  Neck problems started in 2019  She retired in [employer] back to specialist in march  Choose PT (given baclofen but did not help)  Used traction and dry needling  Got a bacterial infection -took abx, then yeast infx and thrush (tx with purple violet) Then pain got worse and ran down her arm  Could not take tramadol- allergic  Neuro surg- Dr Lovell Sheehan -had MRI =DDD and spurs  Deg disc dz-not surgical  Was px gabapentin and it caused some dizziness -stopped it  Then meloxicam 15 mg and trying baclofen  Went to pain management then   Gyn is Dr Jennette Kettle and Ezequiel Essex headed  Woozy  No appetite  Tuesday night-thinks she had a panic attack  Woke up and heart was racing / could not get comfortable  Felt like everything was "closing in" on her  Discussed stressors with the NP  Was px cymbalta 20 mg once daily   Is interested in trying cervical injection on 03/26/2023  Friend died with breast cancer  Daughter is going through breast cancer out of state  FIL has cancer  Brother is sick with heart/copd and she has to care for him   She stopped the estradol patch in dec  Back on it- small dose   Tried massage   BP is up- this is not her   Has an app for health -with meditation type programs  Patient Active Problem List   Diagnosis Date Noted   GAD (generalized anxiety  disorder) 02/17/2021   Chronic pain 02/17/2021   Abnormal thyroid blood test 11/14/2020   Abnormal imaging of thyroid 10/31/2020   DDD (degenerative disc disease), cervical 03/06/2018   TMJ tenderness, right 03/06/2018   Palpitations 10/29/2014   NONSPEC ELEVATION OF LEVELS OF TRANSAMINASE/LDH 08/25/2010   Headache 04/10/2010   MENOPAUSAL SYNDROME 07/12/2009   RHINITIS 09/16/2008   Past Medical History:  Diagnosis Date   Allergic rhinitis, cause unspecified    Back pain    epidural injections   Foot fracture    Headache(784.0)    Nonspecific elevation of levels of transaminase or lactic acid dehydrogenase (LDH)    Pain in joint, site unspecified    Symptomatic menopausal or female climacteric states    Unspecified gastritis and gastroduodenitis without mention of hemorrhage    Unspecified sinusitis (chronic)    Past Surgical History:  Procedure Laterality Date   CCY  2006   COLONOSCOPY     ESOPHAGOGASTRODUODENOSCOPY  2006   gastritis   FOOT FRACTURE SURGERY  2010   Social History   Tobacco Use   Smoking status: Never   Smokeless tobacco: Never  Substance Use Topics   Alcohol use: No   Drug use: No   Family History  Problem Relation Age of Onset   Other  Father        disk disease   Arthritis Unknown        paternal side   Other Brother        disk disease   Allergies  Allergen Reactions   Celecoxib Hives and Nausea And Vomiting    rash, migraine   Codeine Nausea And Vomiting    rash, Syncope, Migraine HA    Morphine    Propoxyphene N-Acetaminophen    Tramadol Hcl    Pneumococcal Vaccines Rash    Local reaction on arm. Redness swelling and rash   Sulfa Antibiotics Rash   Current Outpatient Medications on File Prior to Visit  Medication Sig Dispense Refill   Meloxicam 15 MG TBDP      baclofen (LIORESAL) 10 MG tablet Take 10 mg by mouth at bedtime as needed for muscle spasms.     Calcium Carbonate-Vitamin D (CALCIUM-D PO) Take 1 tablet by mouth daily.      DULoxetine (CYMBALTA) 20 MG capsule Take 20 mg by mouth daily.     estradiol (VIVELLE-DOT) 0.0375 MG/24HR estradiol 0.0375 mg/24 hr semiweekly transdermal patch  APPLY 1 PATCH TWICE WEEKLY     hydrochlorothiazide (HYDRODIURIL) 25 MG tablet Take 25 mg by mouth daily.     IMVEXXY MAINTENANCE PACK 10 MCG INST SMARTSIG:1 Insert Vaginal Twice a Week     latanoprost (XALATAN) 0.005 % ophthalmic solution Place 1 drop into both eyes at bedtime.     Multiple Vitamin (MULTIVITAMIN) tablet Take 1 tablet by mouth daily.     timolol (BETIMOL) 0.5 % ophthalmic solution Place 1 drop into both eyes in the morning.     timolol (TIMOPTIC) 0.5 % ophthalmic solution 1 drop daily.     No current facility-administered medications on file prior to visit.     Review of Systems  Constitutional:  Positive for fatigue. Negative for activity change, appetite change, fever and unexpected weight change.  HENT:  Negative for congestion, ear pain, rhinorrhea, sinus pressure and sore throat.   Eyes:  Negative for pain, redness and visual disturbance.  Respiratory:  Negative for cough, shortness of breath and wheezing.   Cardiovascular:  Negative for chest pain and palpitations.  Gastrointestinal:  Negative for abdominal pain, blood in stool, constipation and diarrhea.  Endocrine: Negative for polydipsia and polyuria.  Genitourinary:  Negative for dysuria, frequency and urgency.  Musculoskeletal:  Positive for arthralgias and neck pain. Negative for back pain and myalgias.  Skin:  Negative for pallor and rash.  Allergic/Immunologic: Negative for environmental allergies.  Neurological:  Negative for dizziness, syncope and headaches.  Hematological:  Negative for adenopathy. Does not bruise/bleed easily.  Psychiatric/Behavioral:  Positive for decreased concentration, dysphoric mood and sleep disturbance. Negative for self-injury and suicidal ideas. The patient is nervous/anxious.       Objective:   Physical  Exam Constitutional:      General: She is not in acute distress.    Appearance: Normal appearance. She is well-developed and normal weight.  HENT:     Head: Normocephalic and atraumatic.  Eyes:     Conjunctiva/sclera: Conjunctivae normal.     Pupils: Pupils are equal, round, and reactive to light.  Neck:     Thyroid: No thyromegaly.     Vascular: No carotid bruit or JVD.     Comments: Limited rom of CS Cardiovascular:     Rate and Rhythm: Normal rate and regular rhythm.     Heart sounds: Normal heart sounds.    No gallop.  Pulmonary:     Effort: Pulmonary effort is normal. No respiratory distress.     Breath sounds: Normal breath sounds. No wheezing or rales.  Abdominal:     General: Bowel sounds are normal. There is no distension or abdominal bruit.     Palpations: Abdomen is soft. There is no mass.     Tenderness: There is no abdominal tenderness.  Musculoskeletal:     Cervical back: Normal range of motion and neck supple. Tenderness present.     Right lower leg: No edema.     Left lower leg: No edema.  Lymphadenopathy:     Cervical: No cervical adenopathy.  Skin:    General: Skin is warm and dry.     Coloration: Skin is not pale.     Findings: No rash.  Neurological:     Mental Status: She is alert.     Coordination: Coordination normal.     Deep Tendon Reflexes: Reflexes are normal and symmetric. Reflexes normal.  Psychiatric:        Attention and Perception: Attention normal.        Mood and Affect: Mood is anxious and depressed.        Cognition and Memory: Cognition and memory normal.     Comments: Candidly discusses stressors          Assessment & Plan:   Problem List Items Addressed This Visit       Other   GAD (generalized anxiety disorder) - Primary    Stress reaction with anxiety symptoms in response to new chronic neurologic pain   Deg disc and joint dz in neck with chronic pain and neuro symptoms  Rev timeline with pt and in chart  Enc her to  start cymbalta as planned -if it works then titrate up  Discussed expectations of SSRI medication including time to effectiveness and mechanism of action, also poss of side effects (early and late)- including mental fuzziness, weight or appetite change, nausea and poss of worse dep or anxiety (even suicidal thoughts)  Pt voiced understanding and will stop med and update if this occurs  Disc imp of self care  Suggested some meditation apps Also ref for counseling  F/u will be planned depending on progress       Relevant Medications   DULoxetine (CYMBALTA) 20 MG capsule   Other Relevant Orders   Ambulatory referral to Psychology   Chronic pain    Deg disc and joint dz in neck with chronic pain and neuro symptoms  Rev timeline with pt and in chart  Enc her to start cymbalta as planned -if it works then titrate up  Enc self care Plans to go forward with cervical injection  Suggested some meditation apps Also ref for counseling  F/u will be planned depending on progress       Relevant Medications   Meloxicam 15 MG TBDP   DULoxetine (CYMBALTA) 20 MG capsule   Other Relevant Orders   Ambulatory referral to Psychology

## 2021-02-19 NOTE — Assessment & Plan Note (Signed)
Stress reaction with anxiety symptoms in response to new chronic neurologic pain   Deg disc and joint dz in neck with chronic pain and neuro symptoms  Rev timeline with pt and in chart  Enc her to start cymbalta as planned -if it works then titrate up  Discussed expectations of SSRI medication including time to effectiveness and mechanism of action, also poss of side effects (early and late)- including mental fuzziness, weight or appetite change, nausea and poss of worse dep or anxiety (even suicidal thoughts)  Pt voiced understanding and will stop med and update if this occurs  Disc imp of self care  Suggested some meditation apps Also ref for counseling  F/u will be planned depending on progress

## 2021-02-19 NOTE — Assessment & Plan Note (Addendum)
Deg disc and joint dz in neck with chronic pain and neuro symptoms  Rev timeline with pt and in chart  Enc her to start cymbalta as planned -if it works then titrate up  Enc self care Plans to go forward with cervical injection  Suggested some meditation apps Also ref for counseling  F/u will be planned depending on progress

## 2021-02-21 ENCOUNTER — Telehealth: Payer: Self-pay | Admitting: Family Medicine

## 2021-02-21 NOTE — Telephone Encounter (Signed)
Grace [name] called in due to she wanted to let Dr. Milinda Antis know that she found a therapist and her first appointment is 7/6 @230 . 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

## 2021-02-21 NOTE — Telephone Encounter (Signed)
That is great! Thanks for letting me know

## 2021-04-10 ENCOUNTER — Other Ambulatory Visit: Payer: Self-pay | Admitting: Neurosurgery

## 2021-04-10 DIAGNOSIS — M545 Low back pain, unspecified: Secondary | ICD-10-CM

## 2021-04-21 ENCOUNTER — Other Ambulatory Visit: Payer: Self-pay

## 2021-04-21 ENCOUNTER — Ambulatory Visit
Admission: RE | Admit: 2021-04-21 | Discharge: 2021-04-21 | Disposition: A | Discharge status: Home / Self Care | Payer: BC Managed Care – PPO | Source: Ambulatory Visit | Attending: Neurosurgery | Admitting: Neurosurgery

## 2021-04-21 DIAGNOSIS — M545 Low back pain, unspecified: Secondary | ICD-10-CM

## 2021-04-21 IMAGING — MR MR LUMBAR SPINE W/O CM
4 of 5 series · 27 of 48 positions shown · non-contrast
Comparison: Lumbar spine radiographs [DATE]

CLINICAL DATA: Low back pain with right leg pain

EXAM:
MRI LUMBAR SPINE WITHOUT CONTRAST
TECHNIQUE: Multiplanar, multisequence MR imaging of the lumbar spine was
performed. No intravenous contrast was administered.

[Series 3: T2 · sagittal · 4.0mm · 1.09mm/px · 6 of 14 slices shown (1 of 2)]
[im 1/14]
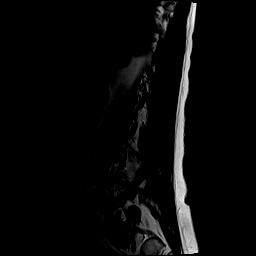
[im 3/14]
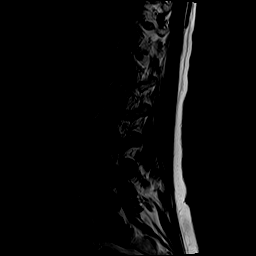
[im 6/14]
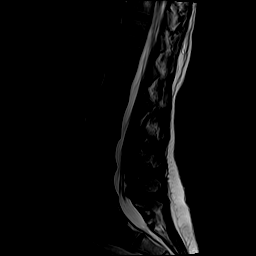
[im 8/14]
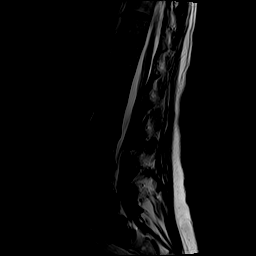
[im 11/14]
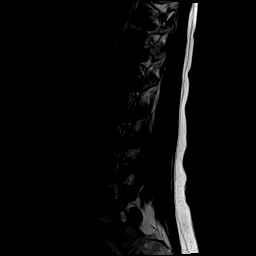
[im 14/14]
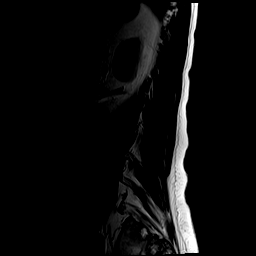

[Series 5: T1 · sagittal · 4.0mm · 1.09mm/px · 5 of 14 slices shown (1 of 2)]
[im 1/14]
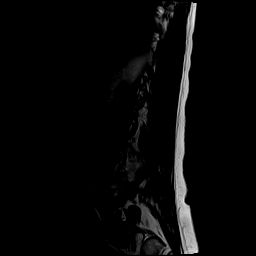
[im 4/14]
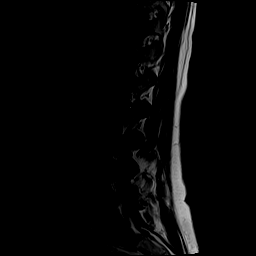
[im 7/14]
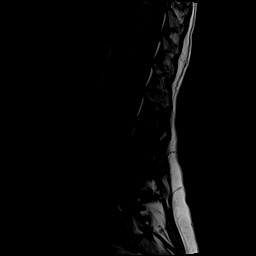
[im 10/14]
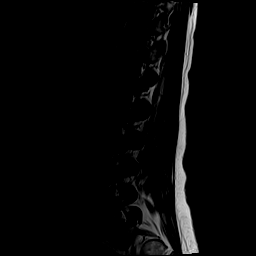
[im 14/14]
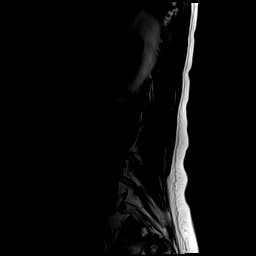

[Series 6: T2 · axial · 4.0mm · 0.39mm/px · z∈[-70,+152]mm · 10 of 44 slices shown (2 of 2)]
[im 3/44]
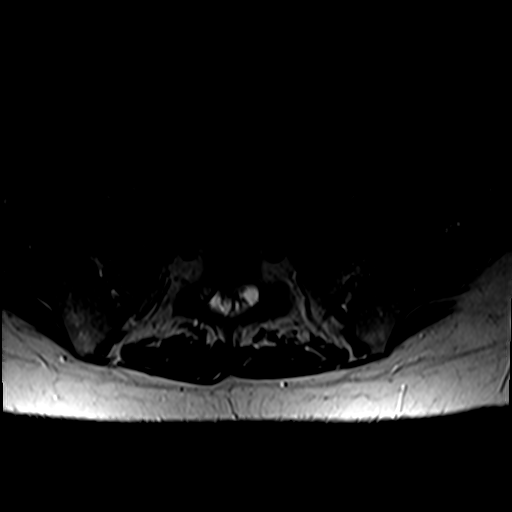
[im 6/44]
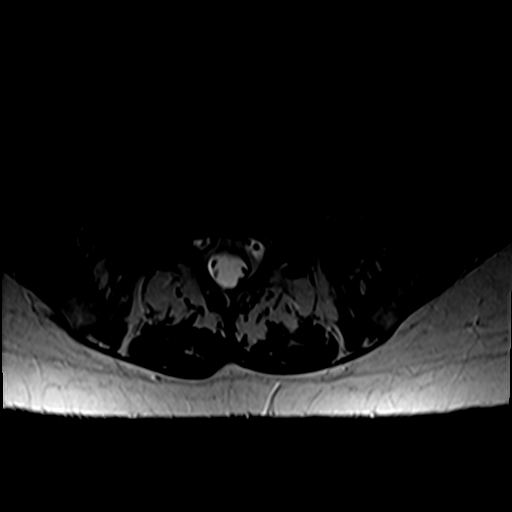
[im 9/44]
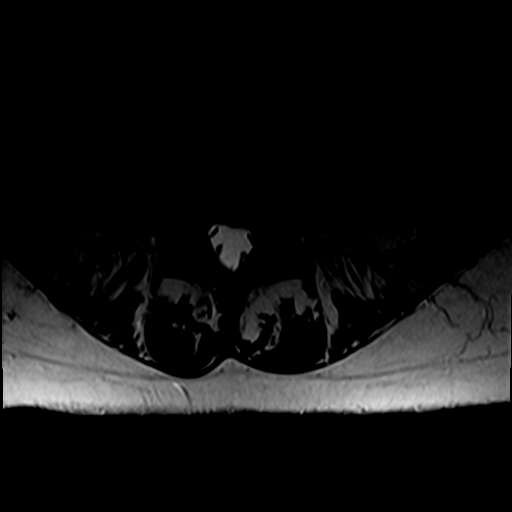
[im 15/44]
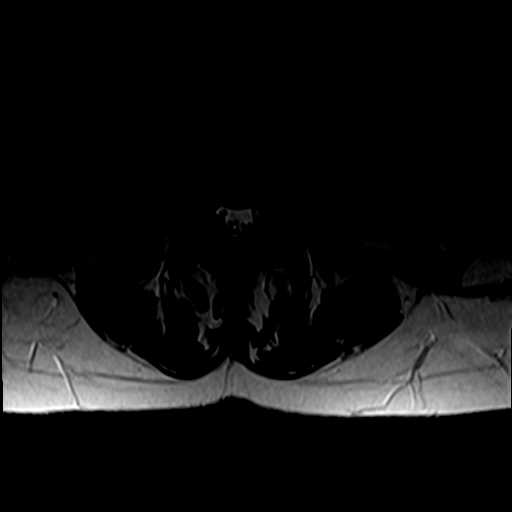
[im 21/44]
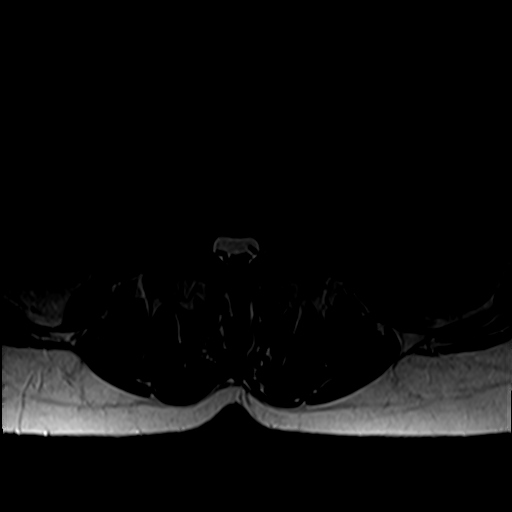
[im 23/44]
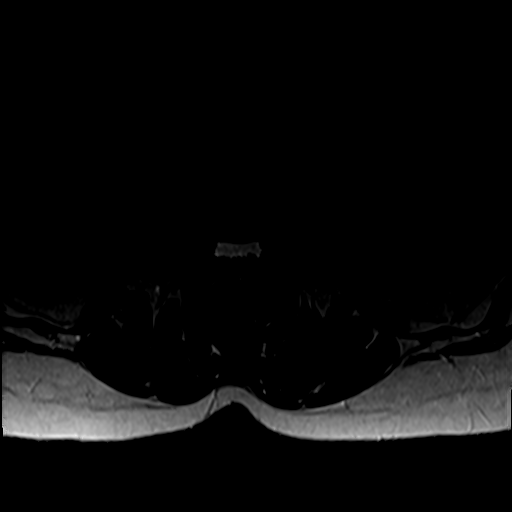
[im 26/44]
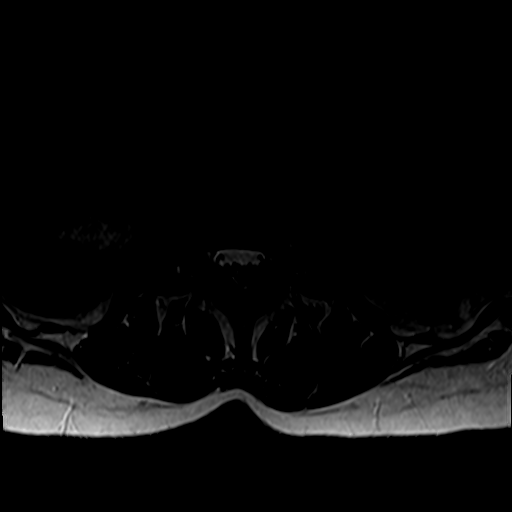
[im 32/44]
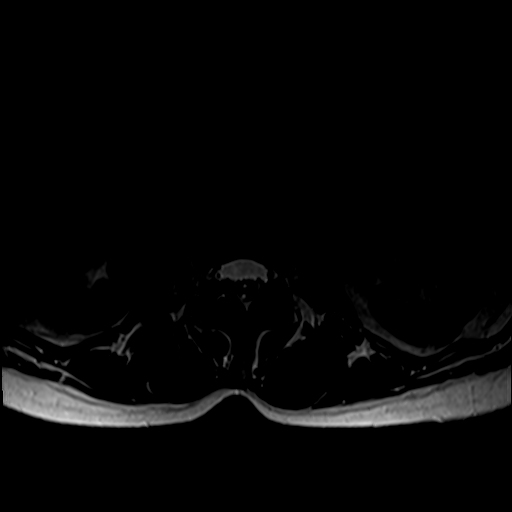
[im 38/44]
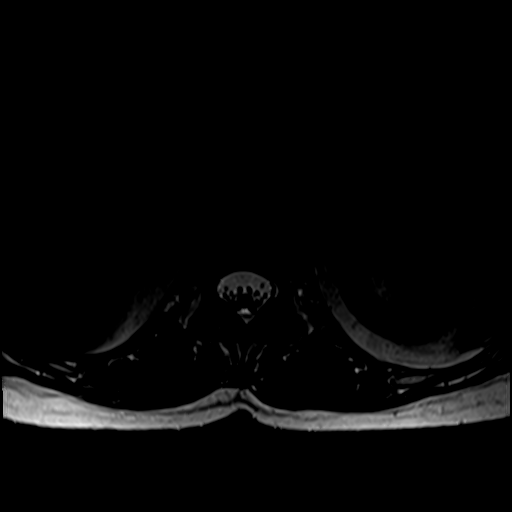
[im 44/44]
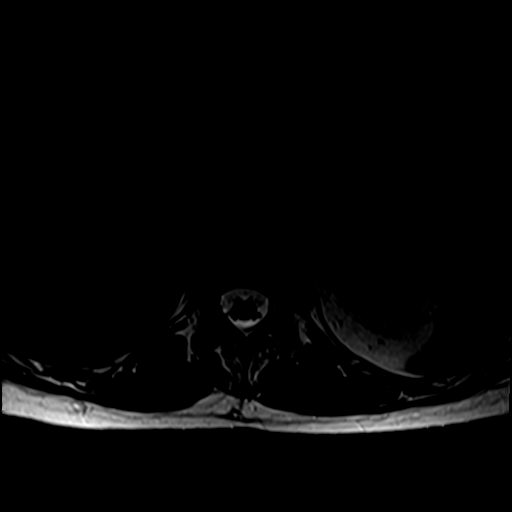

[Series 7: T1 · axial · 4.0mm · 0.39mm/px · z∈[-70,+123]mm · 6 of 44 slices shown (2 of 2)]
[im 3/44]
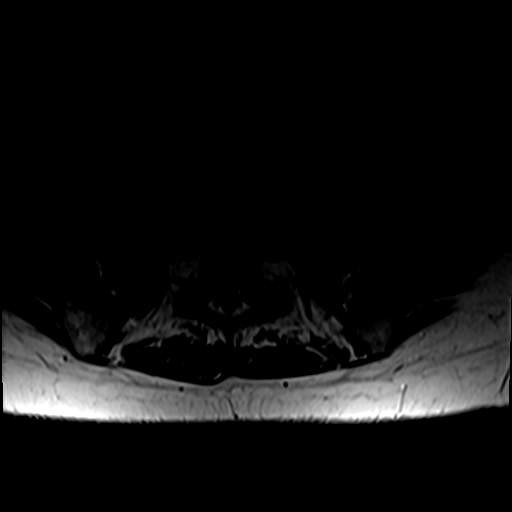
[im 6/44]
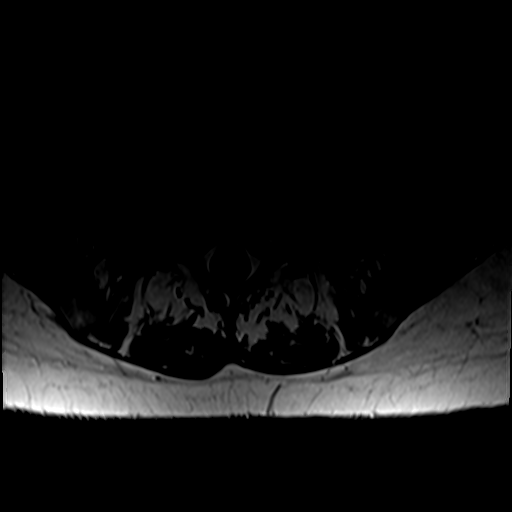
[im 9/44]
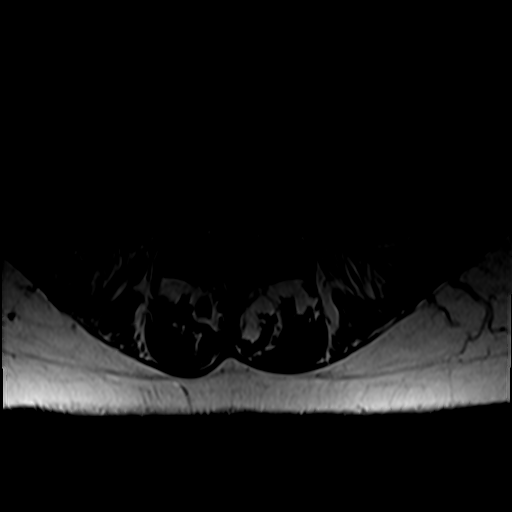
[im 15/44]
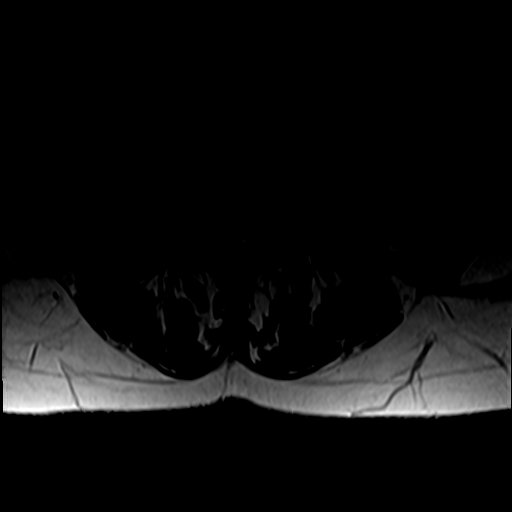
[im 23/44]
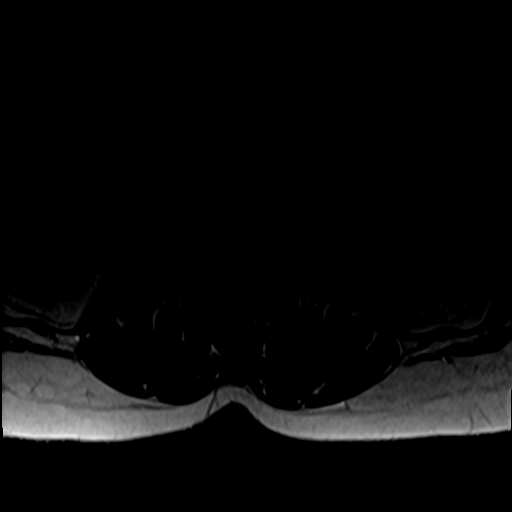
[im 38/44]
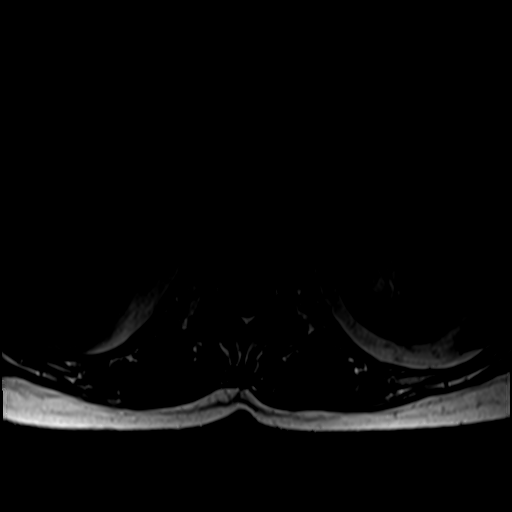

[27 of 48 positions shown; findings below may reference images not displayed]

FINDINGS: Segmentation:  Normal

Alignment:  Normal

Vertebrae:  Normal bone marrow.  Negative for fracture or mass.

Conus medullaris and cauda equina: Conus extends to the L1-2 level.
Conus and cauda equina appear normal.

Paraspinal and other soft tissues: Negative for paraspinous mass or
adenopathy or fluid collection.

Disc levels:

L1-2: Negative

L2-3: Mild bulging of the disc and mild facet degeneration. Negative
for stenosis

L3-4: Diffuse bulging of the disc with central disc protrusion and
bilateral facet hypertrophy. Mild spinal stenosis and mild
subarticular stenosis bilaterally.

L4-5: Shallow central disc protrusion and bilateral facet and
ligamentum flavum hypertrophy. Mild spinal stenosis and mild
subarticular stenosis bilaterally

L5-S1: Moderate disc degeneration with disc space narrowing. Left
paracentral disc and osteophyte with mild displacement of left S1
nerve root. No definite nerve root compression.
IMPRESSION: Mild spinal and subarticular stenosis bilaterally at L3-4 and L4-5

Small left-sided disc and osteophyte L5-S1 touching the left S1
nerve root. Negative for neural compression.

## 2021-07-12 LAB — HM PAP SMEAR: HM Pap smear: NEGATIVE

## 2021-11-28 ENCOUNTER — Encounter: Payer: Self-pay | Admitting: Family Medicine

## 2021-11-28 ENCOUNTER — Ambulatory Visit: Payer: BC Managed Care – PPO | Admitting: Family Medicine

## 2021-11-28 VITALS — BP 144/82 | HR 54 | Temp 97.0°F | Ht 64.0 in | Wt 137.5 lb

## 2021-11-28 DIAGNOSIS — J0100 Acute maxillary sinusitis, unspecified: Secondary | ICD-10-CM

## 2021-11-28 MED ORDER — FLUCONAZOLE 150 MG PO TABS: ORAL_TABLET | ORAL | 0 refills | Status: DC

## 2021-11-28 MED ORDER — AZITHROMYCIN 250 MG PO TABS: ORAL_TABLET | ORAL | 0 refills | Status: DC

## 2021-11-28 NOTE — Patient Instructions (Addendum)
Use nasal saline spray as needed ?Try neosporin on the bump in /on nose  ? ?Take azithromycin as directed  ?Also diflucan 1 now and one in five days  ?Delsym is fine for cough if needed  ? ?Drink fluids ?Breathe steam for congestion  ? ? ?Update if not starting to improve in a week or if worsening   ? ? ?

## 2021-11-28 NOTE — Progress Notes (Signed)
? ?Subjective:  ? ? Patient ID: Grace [name], female    DOB: Mar 12, 1961, 61 y.o.   MRN: [id number] ? ?HPI ?Pt presents with sinus symptoms ? ?Wt Readings from Last 3 Encounters:  ?11/28/21 137 lb 8 oz (62.4 kg)  ?02/17/21 138 lb (62.6 kg)  ?12/21/20 144 lb (65.3 kg)  ? ?23.60 kg/m? ? ?Sinus pain and pressure for 2 weeks  (since march 25th)  ?Started with a cold  ?No covid or strep throat  ?ST-was bad to start and now just in am with drainage  ? ?Nasal congestion - mucous is green with some blood (both sides) ?Spits out green mucous with blood also ?A little cough last night (used a cough drop) ? ?Some runny nose and sneezing  ?Trying to stay in but does walk  ? ?Pain is above eyes  ?Now sore face under eyes  ?Worse on the L ?Ears feel clogged on and off  ? ?No fever  ? ?Otc ?Cough drops ?Sudafed ?Advil cold and sinus ?Aleve or ibuprofen  ?Tylenol  ? ?Prefers azithro to augmentin (? Less potential for yeast infection) ? ?Patient Active Problem List  ? Diagnosis Date Noted  ? GAD (generalized anxiety disorder) 02/17/2021  ? Chronic pain 02/17/2021  ? Abnormal thyroid blood test 11/14/2020  ? Abnormal imaging of thyroid 10/31/2020  ? DDD (degenerative disc disease), cervical 03/06/2018  ? TMJ tenderness, right 03/06/2018  ? Palpitations 10/29/2014  ? Acute sinusitis 10/26/2013  ? NONSPEC ELEVATION OF LEVELS OF TRANSAMINASE/LDH 08/25/2010  ? Headache 04/10/2010  ? MENOPAUSAL SYNDROME 07/12/2009  ? RHINITIS 09/16/2008  ? ?Past Medical History:  ?Diagnosis Date  ? Allergic rhinitis, cause unspecified   ? Back pain   ? epidural injections  ? Foot fracture   ? Headache(784.0)   ? Nonspecific elevation of levels of transaminase or lactic acid dehydrogenase (LDH)   ? Pain in joint, site unspecified   ? Symptomatic menopausal or female climacteric states   ? Unspecified gastritis and gastroduodenitis without mention of hemorrhage   ? Unspecified sinusitis (chronic)   ? ?Past Surgical History:  ?Procedure Laterality Date  ?  CCY  2006  ? COLONOSCOPY    ? ESOPHAGOGASTRODUODENOSCOPY  2006  ? gastritis  ? FOOT FRACTURE SURGERY  2010  ? ?Social History  ? ?Tobacco Use  ? Smoking status: Never  ? Smokeless tobacco: Never  ?Substance Use Topics  ? Alcohol use: No  ? Drug use: No  ? ?Family History  ?Problem Relation Age of Onset  ? Other Father   ?     disk disease  ? Arthritis Unknown   ?     paternal side  ? Other Brother   ?     disk disease  ? ?Allergies  ?Allergen Reactions  ? Celecoxib Hives and Nausea And Vomiting  ?  rash, migraine  ? Codeine Nausea And Vomiting  ?  rash, Syncope, Migraine HA ?  ? Morphine   ? Propoxyphene N-Acetaminophen   ? Tramadol Hcl   ? Pneumococcal Vaccines Rash  ?  Local reaction on arm. Redness swelling and rash  ? Sulfa Antibiotics Rash  ? ?Current Outpatient Medications on File Prior to Visit  ?Medication Sig Dispense Refill  ? acetaminophen (TYLENOL) 500 MG tablet Take 1,000 mg by mouth in the morning and at bedtime.    ? baclofen (LIORESAL) 10 MG tablet Take 10 mg by mouth at bedtime as needed for muscle spasms.    ? Calcium Carbonate-Vitamin D (CALCIUM-D  PO) Take 1 tablet by mouth daily.    ? DULoxetine (CYMBALTA) 30 MG capsule TAKE 2 CAPSULES BY MOUTH EVERY MORNING AND TAKE 1 CAPSULE EVERY EVENING    ? estradiol (VIVELLE-DOT) 0.0375 MG/24HR estradiol 0.0375 mg/24 hr semiweekly transdermal patch ? APPLY 1 PATCH TWICE WEEKLY    ? hydrochlorothiazide (HYDRODIURIL) 25 MG tablet Take 25 mg by mouth daily.    ? IMVEXXY MAINTENANCE PACK 10 MCG INST SMARTSIG:1 Insert Vaginal Twice a Week    ? latanoprost (XALATAN) 0.005 % ophthalmic solution Place 1 drop into both eyes at bedtime.    ? Multiple Vitamin (MULTIVITAMIN) tablet Take 1 tablet by mouth daily.    ? Naproxen Sodium (ALEVE) 220 MG CAPS Take 1 capsule by mouth in the morning and at bedtime.    ? timolol (TIMOPTIC) 0.5 % ophthalmic solution 1 drop 2 (two) times daily.    ? ?No current facility-administered medications on file prior to visit.  ?  ? ?Review  of Systems  ?Constitutional:  Negative for activity change, appetite change, fatigue, fever and unexpected weight change.  ?HENT:  Positive for congestion, postnasal drip, rhinorrhea, sinus pressure, sinus pain and sneezing. Negative for ear pain, facial swelling, nosebleeds and sore throat.   ?Eyes:  Negative for pain, redness, itching and visual disturbance.  ?Respiratory:  Positive for cough. Negative for shortness of breath and wheezing.   ?Cardiovascular:  Negative for chest pain and palpitations.  ?Gastrointestinal:  Negative for abdominal pain, blood in stool, constipation, diarrhea, nausea and vomiting.  ?Endocrine: Negative for polydipsia and polyuria.  ?Genitourinary:  Negative for dysuria, frequency and urgency.  ?Musculoskeletal:  Negative for arthralgias, back pain and myalgias.  ?Skin:  Negative for pallor and rash.  ?Allergic/Immunologic: Negative for environmental allergies and immunocompromised state.  ?Neurological:  Negative for dizziness, tremors, syncope, weakness, numbness and headaches.  ?Hematological:  Negative for adenopathy. Does not bruise/bleed easily.  ?Psychiatric/Behavioral:  Negative for decreased concentration and dysphoric mood. The patient is not nervous/anxious.   ? ?   ?Objective:  ? Physical Exam ?Constitutional:   ?   General: She is not in acute distress. ?   Appearance: Normal appearance. She is well-developed. She is not ill-appearing.  ?HENT:  ?   Head: Normocephalic and atraumatic.  ?   Comments: Bilateral maxillary and frontal sinus tenderness ?   Right Ear: Tympanic membrane and external ear normal.  ?   Left Ear: Tympanic membrane and external ear normal.  ?   Nose: Congestion and rhinorrhea present.  ?   Mouth/Throat:  ?   Pharynx: Oropharynx is clear. No oropharyngeal exudate or posterior oropharyngeal erythema.  ?   Comments: Clear pnd ?Eyes:  ?   General:     ?   Right eye: No discharge.     ?   Left eye: No discharge.  ?   Conjunctiva/sclera: Conjunctivae normal.   ?   Pupils: Pupils are equal, round, and reactive to light.  ?Cardiovascular:  ?   Rate and Rhythm: Normal rate and regular rhythm.  ?Pulmonary:  ?   Effort: Pulmonary effort is normal. No respiratory distress.  ?   Breath sounds: Normal breath sounds. No wheezing or rales.  ?   Comments: Good air exch ?No rales or rhonchi ?Musculoskeletal:  ?   Cervical back: Normal range of motion and neck supple.  ?Lymphadenopathy:  ?   Cervical: No cervical adenopathy.  ?Skin: ?   General: Skin is warm and dry.  ?   Findings:  No rash.  ?Neurological:  ?   Mental Status: She is alert.  ?   Cranial Nerves: No cranial nerve deficit.  ?   Coordination: Coordination normal.  ?Psychiatric:     ?   Mood and Affect: Mood normal.  ? ? ? ? ? ?   ?Assessment & Plan:  ? ?Problem List Items Addressed This Visit   ? ?  ? Respiratory  ? Acute sinusitis - Primary  ?  S/p viral uri with sinus pain and purulent mucous ?tx with azithro (pt prefers)  ?Declines prednisone/consider if worse  ?Diflucan prn for yeast infection  ?Discussed symptomatic care with saline irrigation, delsym for cough ? ? ? ?  ?  ? Relevant Medications  ? azithromycin (ZITHROMAX Z-PAK) 250 MG tablet  ? fluconazole (DIFLUCAN) 150 MG tablet  ? ? ?

## 2021-11-28 NOTE — Assessment & Plan Note (Signed)
S/p viral uri with sinus pain and purulent mucous ?tx with azithro (pt prefers)  ?Declines prednisone/consider if worse  ?Diflucan prn for yeast infection  ?Discussed symptomatic care with saline irrigation, delsym for cough ? ? ? ?

## 2021-12-11 ENCOUNTER — Other Ambulatory Visit: Payer: Self-pay

## 2021-12-11 ENCOUNTER — Emergency Department (HOSPITAL_COMMUNITY): Payer: BC Managed Care – PPO

## 2021-12-11 ENCOUNTER — Observation Stay (HOSPITAL_COMMUNITY): Payer: BC Managed Care – PPO

## 2021-12-11 ENCOUNTER — Observation Stay (HOSPITAL_COMMUNITY)
Admission: EM | Admit: 2021-12-11 | Discharge: 2021-12-12 | Disposition: A | Discharge status: Home / Self Care | Payer: BC Managed Care – PPO | Attending: Internal Medicine | Admitting: Internal Medicine

## 2021-12-11 DIAGNOSIS — R2990 Unspecified symptoms and signs involving the nervous system: Secondary | ICD-10-CM | POA: Diagnosis not present

## 2021-12-11 DIAGNOSIS — G9340 Encephalopathy, unspecified: Secondary | ICD-10-CM | POA: Diagnosis not present

## 2021-12-11 DIAGNOSIS — E876 Hypokalemia: Secondary | ICD-10-CM | POA: Insufficient documentation

## 2021-12-11 DIAGNOSIS — I10 Essential (primary) hypertension: Secondary | ICD-10-CM | POA: Diagnosis not present

## 2021-12-11 DIAGNOSIS — R4182 Altered mental status, unspecified: Secondary | ICD-10-CM | POA: Diagnosis present

## 2021-12-11 DIAGNOSIS — R410 Disorientation, unspecified: Secondary | ICD-10-CM

## 2021-12-11 DIAGNOSIS — D72829 Elevated white blood cell count, unspecified: Secondary | ICD-10-CM | POA: Diagnosis not present

## 2021-12-11 DIAGNOSIS — Z79899 Other long term (current) drug therapy: Secondary | ICD-10-CM | POA: Diagnosis not present

## 2021-12-11 LAB — I-STAT CHEM 8, ED
BUN: 23 mg/dL (ref 8–23)
Calcium, Ion: 1.11 mmol/L — ABNORMAL LOW (ref 1.15–1.40)
Chloride: 101 mmol/L (ref 98–111)
Creatinine, Ser: 0.6 mg/dL (ref 0.44–1.00)
Glucose, Bld: 117 mg/dL — ABNORMAL HIGH (ref 70–99)
HCT: 48 % — ABNORMAL HIGH (ref 36.0–46.0)
Hemoglobin: 16.3 g/dL — ABNORMAL HIGH (ref 12.0–15.0)
Potassium: 3.5 mmol/L (ref 3.5–5.1)
Sodium: 139 mmol/L (ref 135–145)
TCO2: 30 mmol/L (ref 22–32)

## 2021-12-11 LAB — CBC
HCT: 45.2 % (ref 36.0–46.0)
Hemoglobin: 15.2 g/dL — ABNORMAL HIGH (ref 12.0–15.0)
MCH: 31.7 pg (ref 26.0–34.0)
MCHC: 33.6 g/dL (ref 30.0–36.0)
MCV: 94.4 fL (ref 80.0–100.0)
Platelets: 247 10*3/uL (ref 150–400)
RBC: 4.79 MIL/uL (ref 3.87–5.11)
RDW: 13.2 % (ref 11.5–15.5)
WBC: 14.7 10*3/uL — ABNORMAL HIGH (ref 4.0–10.5)
nRBC: 0 % (ref 0.0–0.2)

## 2021-12-11 LAB — APTT: aPTT: 26 seconds (ref 24–36)

## 2021-12-11 LAB — COMPREHENSIVE METABOLIC PANEL
ALT: 51 U/L — ABNORMAL HIGH (ref 0–44)
AST: 48 U/L — ABNORMAL HIGH (ref 15–41)
Albumin: 4 g/dL (ref 3.5–5.0)
Alkaline Phosphatase: 42 U/L (ref 38–126)
Anion gap: 9 (ref 5–15)
BUN: 19 mg/dL (ref 8–23)
CO2: 27 mmol/L (ref 22–32)
Calcium: 9 mg/dL (ref 8.9–10.3)
Chloride: 103 mmol/L (ref 98–111)
Creatinine, Ser: 0.68 mg/dL (ref 0.44–1.00)
GFR, Estimated: >60 mL/min (ref >60)
Glucose, Bld: 124 mg/dL — ABNORMAL HIGH (ref 70–99)
Potassium: 3.5 mmol/L (ref 3.5–5.1)
Sodium: 139 mmol/L (ref 135–145)
Total Bilirubin: 0.8 mg/dL (ref 0.3–1.2)
Total Protein: 6.7 g/dL (ref 6.5–8.1)

## 2021-12-11 LAB — DIFFERENTIAL
Abs Immature Granulocytes: 0.07 10*3/uL (ref 0.00–0.07)
Basophils Absolute: 0 10*3/uL (ref 0.0–0.1)
Basophils Relative: 0 %
Eosinophils Absolute: 0.1 10*3/uL (ref 0.0–0.5)
Eosinophils Relative: 1 %
Immature Granulocytes: 1 %
Lymphocytes Relative: 1 %
Lymphs Abs: 0.2 10*3/uL — ABNORMAL LOW (ref 0.7–4.0)
Monocytes Absolute: 0.4 10*3/uL (ref 0.1–1.0)
Monocytes Relative: 3 %
Neutro Abs: 13.9 10*3/uL — ABNORMAL HIGH (ref 1.7–7.7)
Neutrophils Relative %: 94 %

## 2021-12-11 LAB — RAPID URINE DRUG SCREEN, HOSP PERFORMED
Amphetamines: NOT DETECTED
Barbiturates: NOT DETECTED
Benzodiazepines: NOT DETECTED
Cocaine: NOT DETECTED
Opiates: NOT DETECTED
Tetrahydrocannabinol: NOT DETECTED

## 2021-12-11 LAB — URINALYSIS, ROUTINE W REFLEX MICROSCOPIC
Bilirubin Urine: NEGATIVE
Glucose, UA: NEGATIVE mg/dL
Ketones, ur: 20 mg/dL — AB
Leukocytes,Ua: NEGATIVE
Nitrite: NEGATIVE
Protein, ur: NEGATIVE mg/dL
Specific Gravity, Urine: 1.021 (ref 1.005–1.030)
pH: 5 (ref 5.0–8.0)

## 2021-12-11 LAB — MAGNESIUM: Magnesium: 1.9 mg/dL (ref 1.7–2.4)

## 2021-12-11 LAB — PROTIME-INR
INR: 0.9 (ref 0.8–1.2)
Prothrombin Time: 12 seconds (ref 11.4–15.2)

## 2021-12-11 LAB — CBG MONITORING, ED: Glucose-Capillary: 126 mg/dL — ABNORMAL HIGH (ref 70–99)

## 2021-12-11 LAB — HIV ANTIBODY (ROUTINE TESTING W REFLEX): HIV Screen 4th Generation wRfx: NONREACTIVE

## 2021-12-11 IMAGING — DX DG CHEST 1V PORT
1 series · 1 of 1 positions shown · non-contrast
Comparison: Chest x-ray [DATE].

CLINICAL DATA: Nausea and vomiting.

EXAM:
PORTABLE CHEST 1 VIEW

[chest]
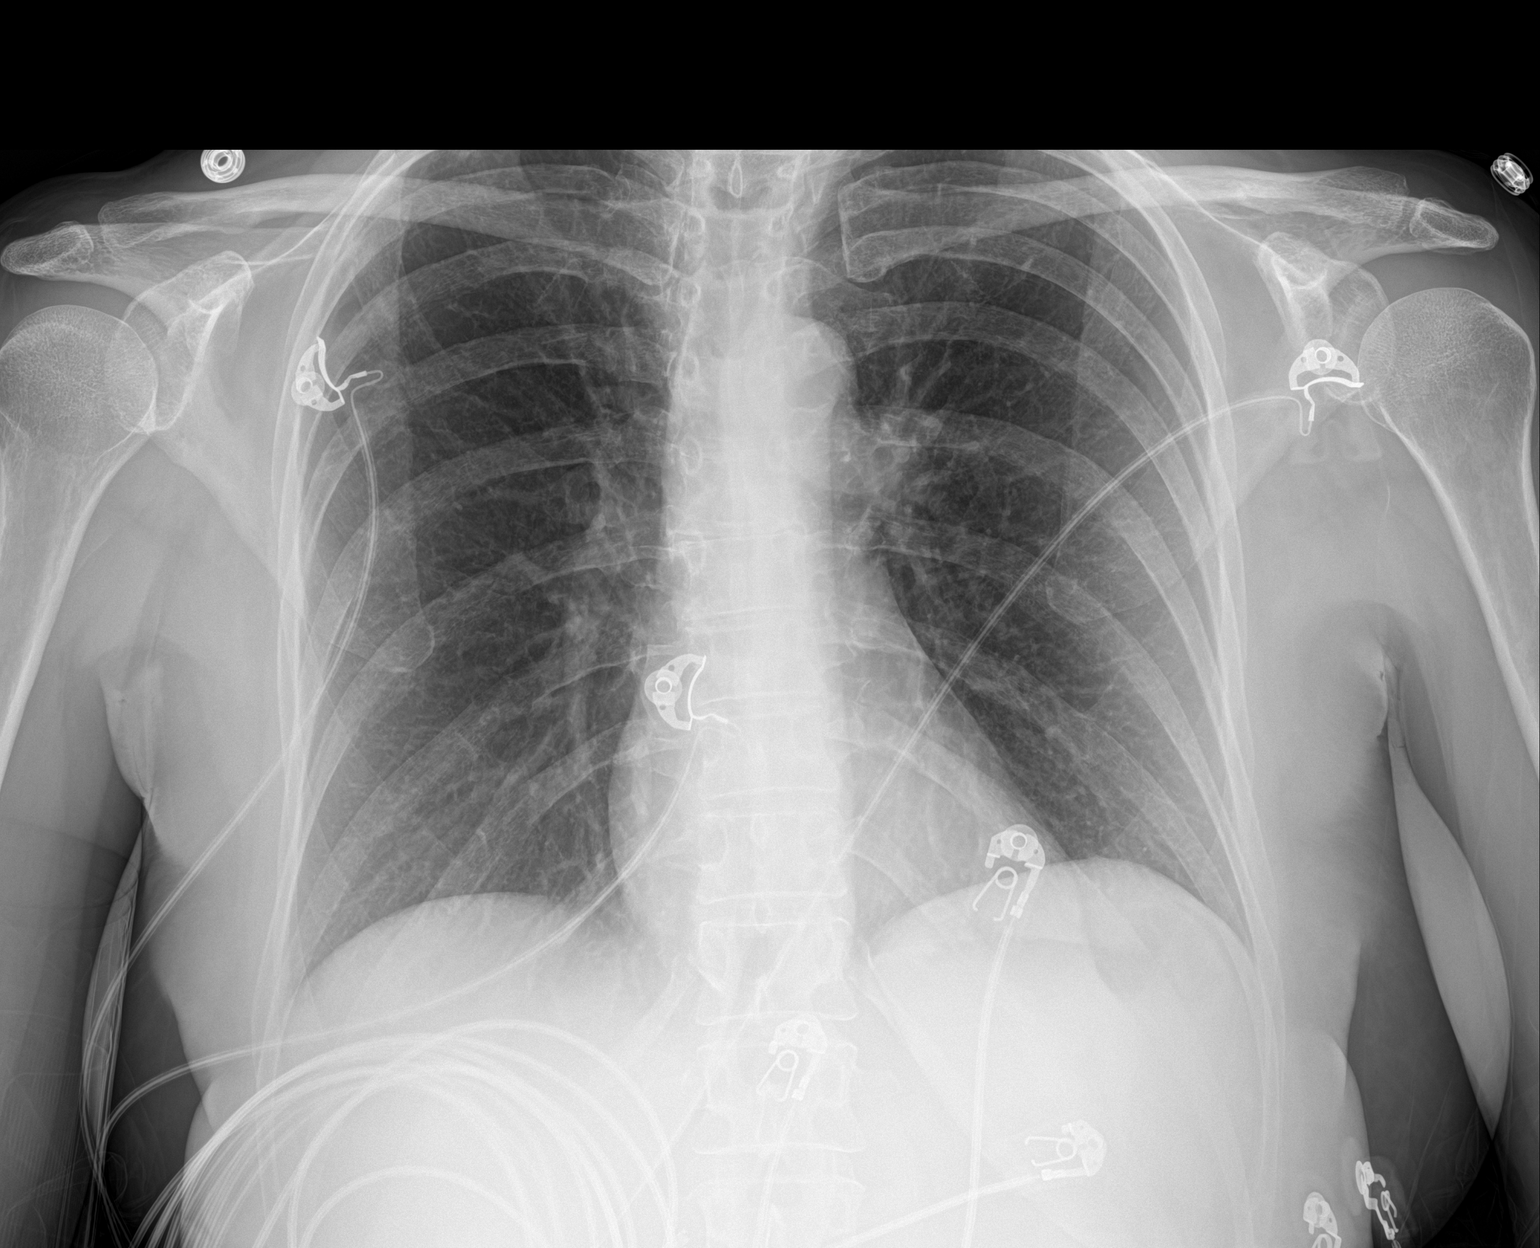

[1 of 1 positions shown; findings below may reference images not displayed]

FINDINGS: The heart size and mediastinal contours are within normal limits.
Both lungs are clear. The visualized skeletal structures are
unremarkable.
IMPRESSION: No active disease.

## 2021-12-11 IMAGING — MR MR HEAD W/O CM
11 of 12 series · 43 of 48 positions shown · non-contrast
Comparison: Same-day noncontrast CT head

CLINICAL DATA: Delirium, patient states she blacked out

EXAM:
MRI HEAD WITHOUT CONTRAST
TECHNIQUE: Multiplanar, multiecho pulse sequences of the brain and surrounding
structures were obtained without intravenous contrast.

[Series 5: DWI · axial · 3.0mm · 0.88mm/px · z∈[-55,+83]mm · 7 of 96 slices shown (1 of 4)]
[im 1/96]
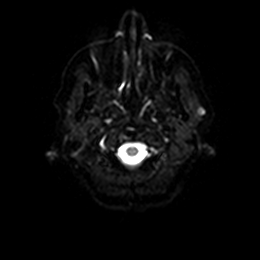
[im 16/96]
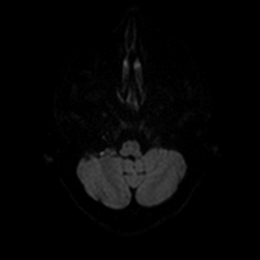
[im 32/96]
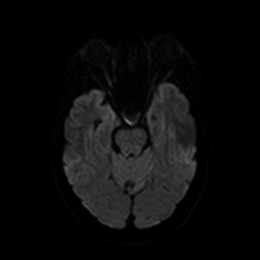
[im 48/96]
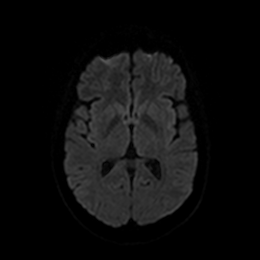
[im 64/96]
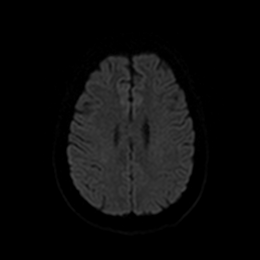
[im 80/96]
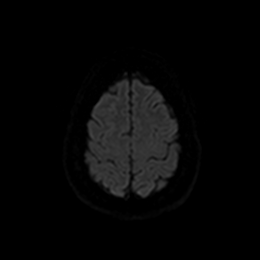
[im 96/96]
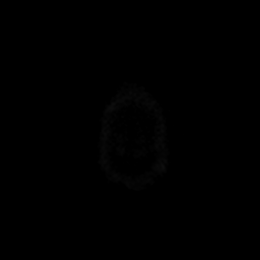

[Series 6: DWI · axial · 3.0mm · 0.88mm/px · z∈[-55,+83]mm · 4 of 47 slices shown (2 of 4)]
[im 1/47]
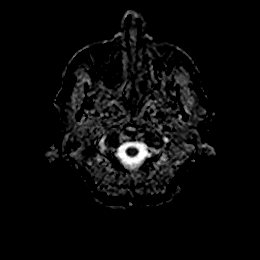
[im 16/47]
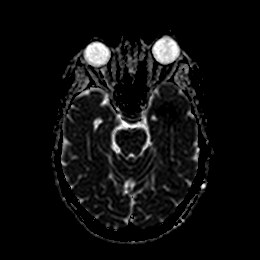
[im 31/47]
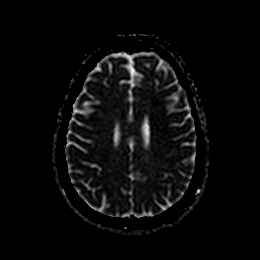
[im 47/47]
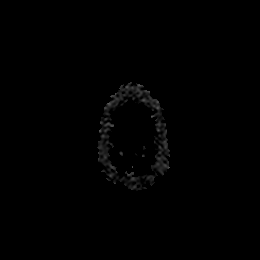

[Series 7: DWI · coronal · 4.0mm · 0.88mm/px · 6 of 68 slices shown (3 of 4)]
[im 1/68]
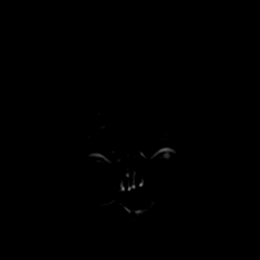
[im 14/68]
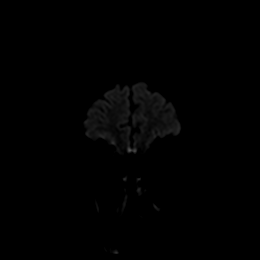
[im 27/68]
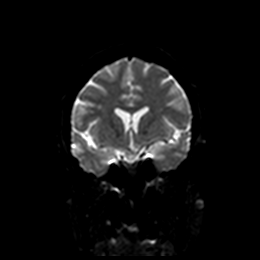
[im 41/68]
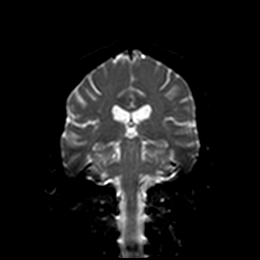
[im 54/68]
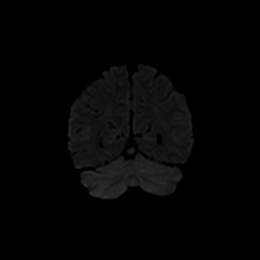
[im 68/68]
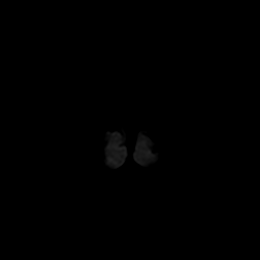

[Series 8: DWI · coronal · 4.0mm · 0.88mm/px · 3 of 34 slices shown (4 of 4)]
[im 1/34]
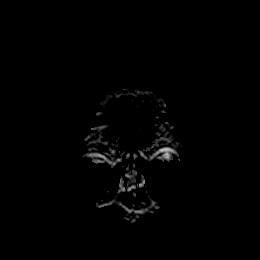
[im 17/34]
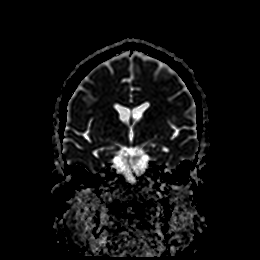
[im 34/34]
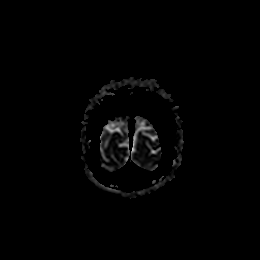

[Series 9: T1 · sagittal · 5.0mm · 0.75mm/px · 2 of 23 slices shown]
[im 1/23]
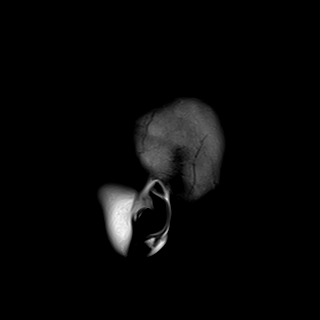
[im 23/23]
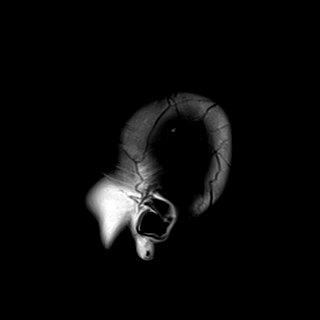

[Series 10: T2 · axial · 5.0mm · 0.72mm/px · z∈[-60,+85]mm · 2 of 26 slices shown (1 of 2)]
[im 1/26]
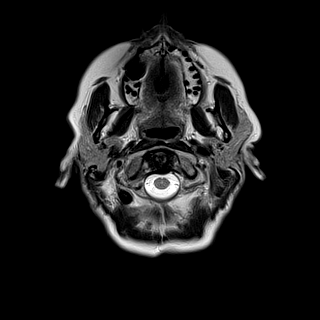
[im 26/26]
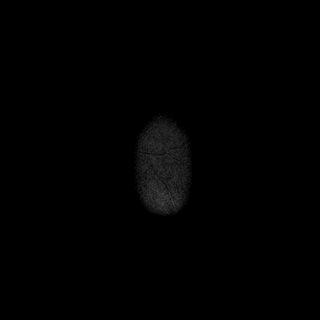

[Series 11: FLAIR · axial · 5.0mm · 0.45mm/px · z∈[-62,+83]mm · 2 of 26 slices shown]
[im 1/26]
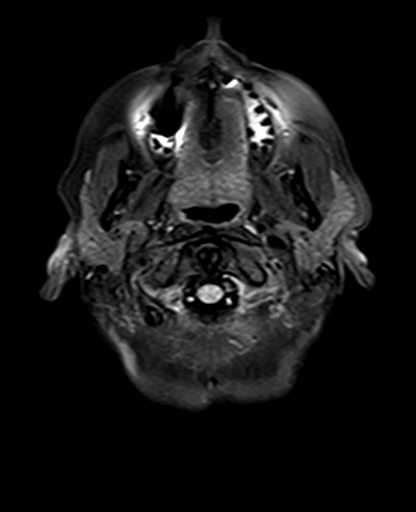
[im 26/26]
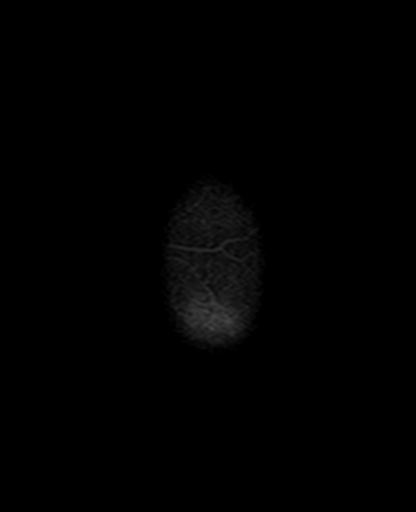

[Series 12: mag_images · axial · 3.0mm · 0.90mm/px · z∈[-63,+99]mm · 5 of 56 slices shown]
[im 1/56]
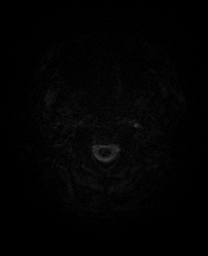
[im 14/56]
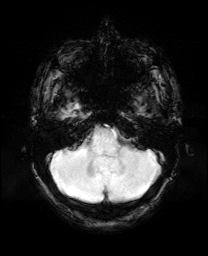
[im 28/56]
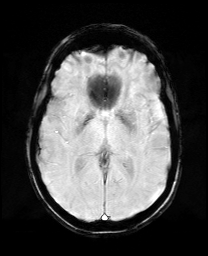
[im 42/56]
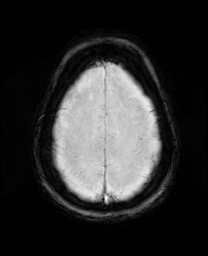
[im 56/56]
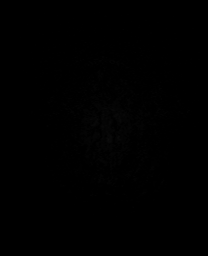

[Series 13: pha_images · axial · 3.0mm · 0.90mm/px · z∈[-63,+99]mm · 5 of 55 slices shown]
[im 1/55]
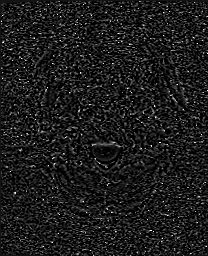
[im 14/55]
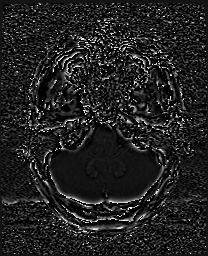
[im 28/55]
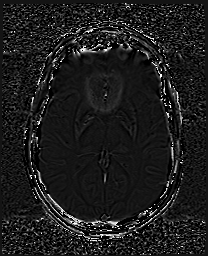
[im 41/55]
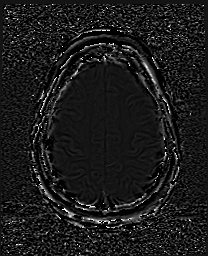
[im 55/55]
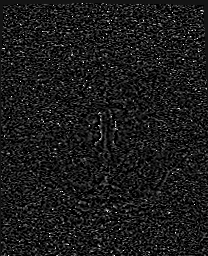

[Series 14: swi_images · axial · 3.0mm · 0.90mm/px · z∈[-63,+99]mm · 5 of 56 slices shown]
[im 1/56]
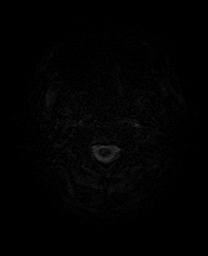
[im 14/56]
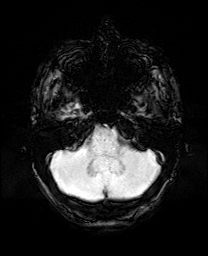
[im 28/56]
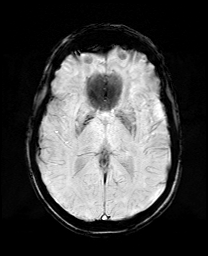
[im 42/56]
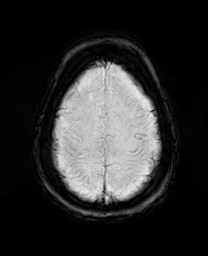
[im 56/56]
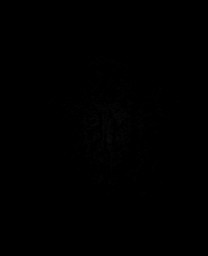

[Series 17: T2 · coronal · 5.0mm · 0.34mm/px · 2 of 29 slices shown (2 of 2)]
[im 1/29]
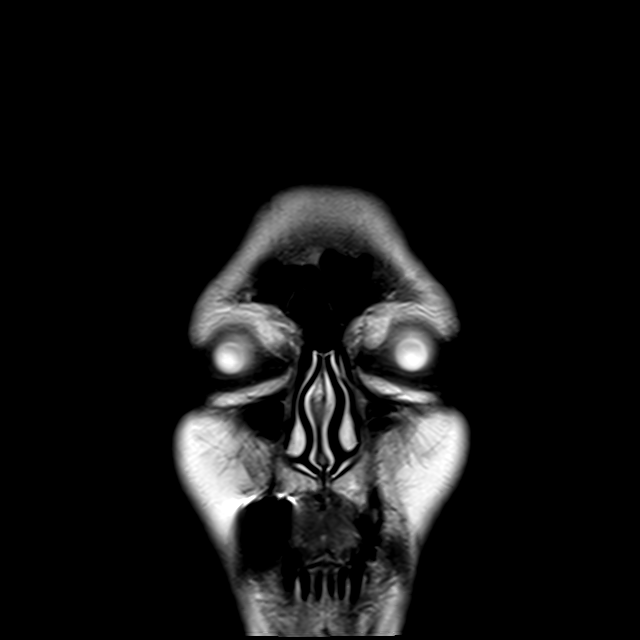
[im 29/29]
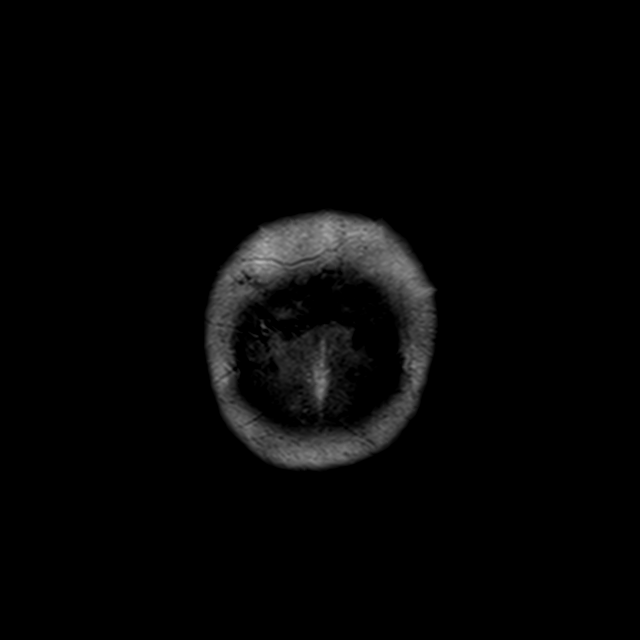

[43 of 48 positions shown; findings below may reference images not displayed]

FINDINGS: Brain: There is no acute intracranial hemorrhage, extra-axial fluid
collection, or acute infarct.

Parenchymal volume is normal. The ventricles are normal in size.
Gray-white differentiation is preserved. Patchy foci of FLAIR signal
abnormality in the subcortical and periventricular white matter
likely reflects sequela of mild chronic white matter
microangiopathy.

There is no suspicious parenchymal signal abnormality. There is no
solid mass lesion. There is no mass effect or midline shift.

Vascular: Normal flow voids.

Skull and upper cervical spine: Normal marrow signal.

Sinuses/Orbits: Paranasal sinuses are clear. The globes and orbits
are unremarkable.

Other: None.
IMPRESSION: No acute intracranial pathology.

## 2021-12-11 IMAGING — CT CT HEAD CODE STROKE
3 series · 15 of 47 positions shown, 18 images · non-contrast
Comparison: None.

CLINICAL DATA: Code stroke.  Neuro deficit, acute, stroke suspected



[Series 2: head 5.0 st · axial · 0.43mm/px · z∈[-147,-12]mm · 9 of 33 slices shown, 12 images]
[im 3/33  brain]
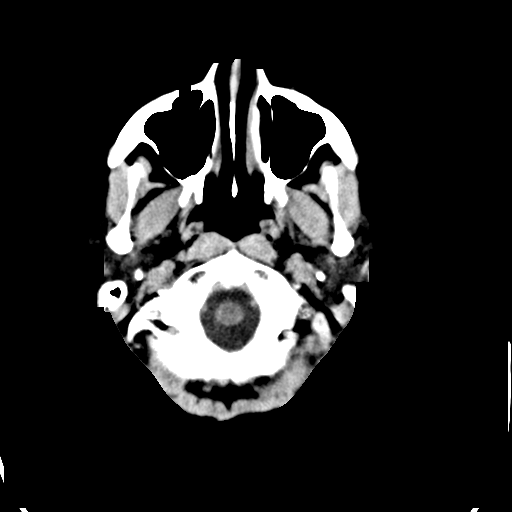
[im 3/33  bone]
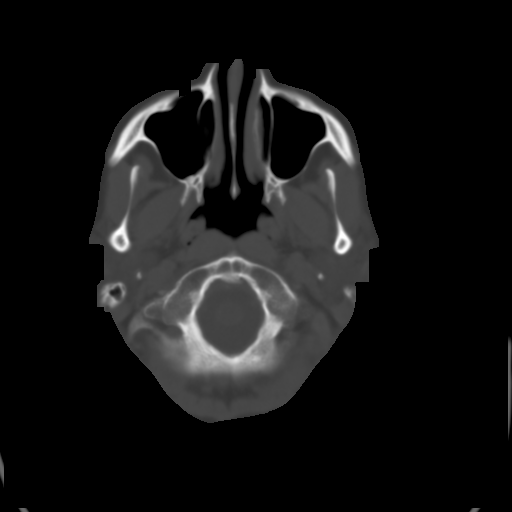
[im 6/33  brain]
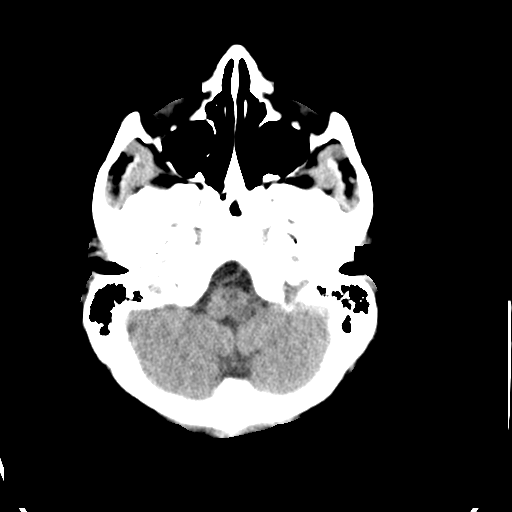
[im 9/33  brain]
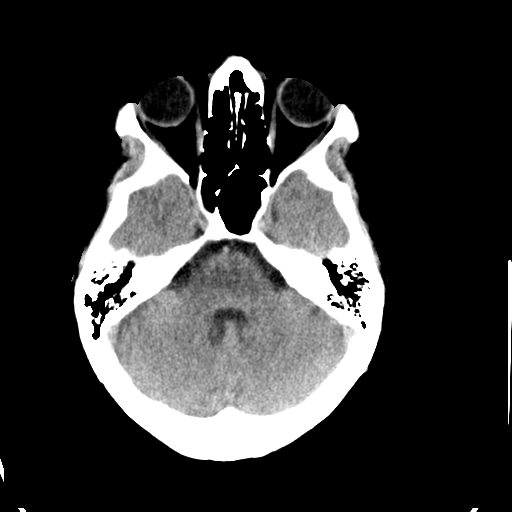
[im 13/33  brain]
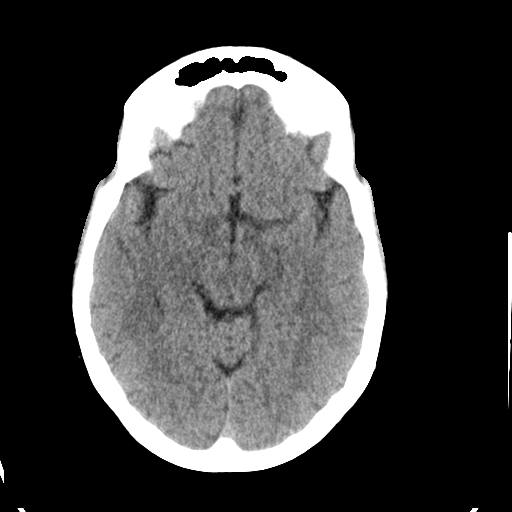
[im 17/33  brain]
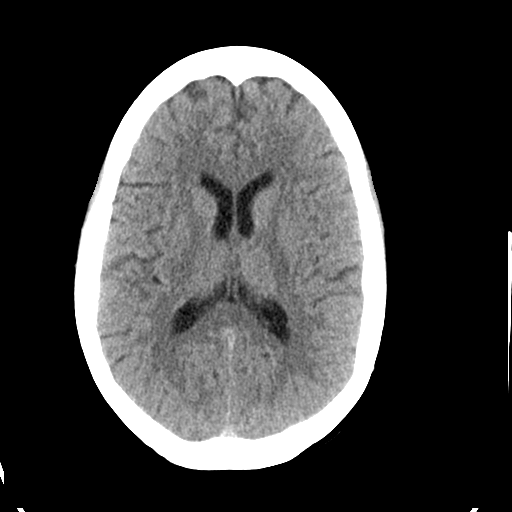
[im 17/33  bone]
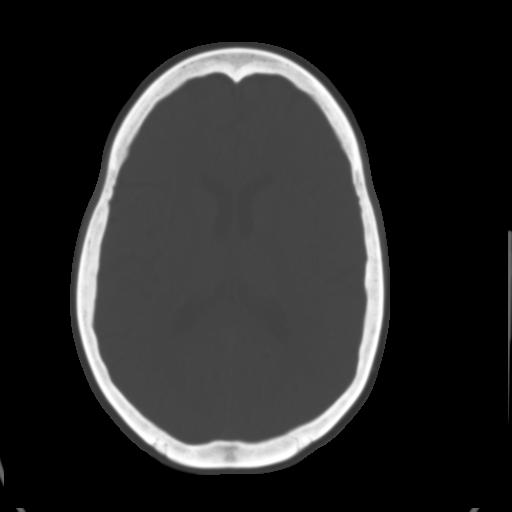
[im 20/33  brain]
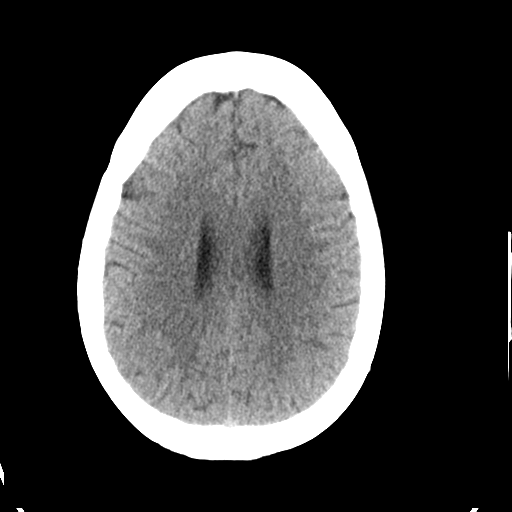
[im 24/33  brain]
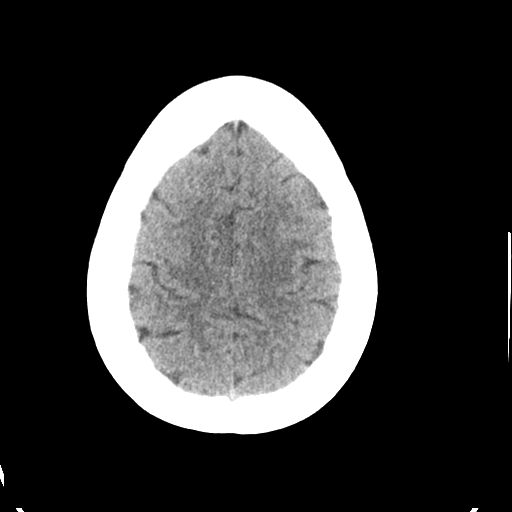
[im 27/33  brain]
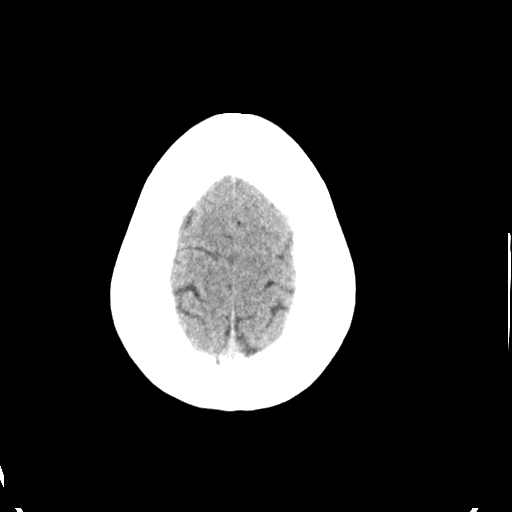
[im 30/33  brain]
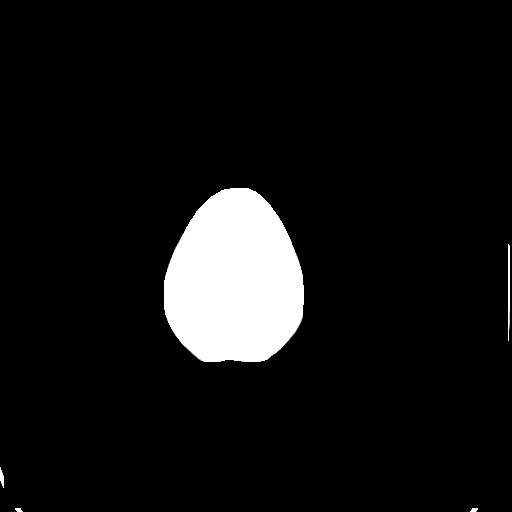
[im 30/33  bone]
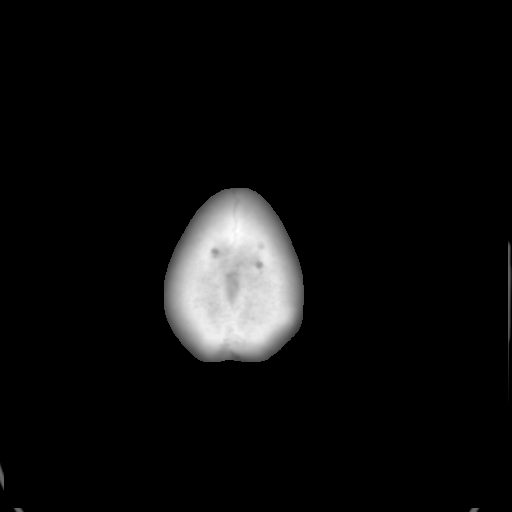

[Series 5: head 3.0 cor st · coronal · 0.31mm/px · 3 of 67 slices shown]
[im 23/67  brain]
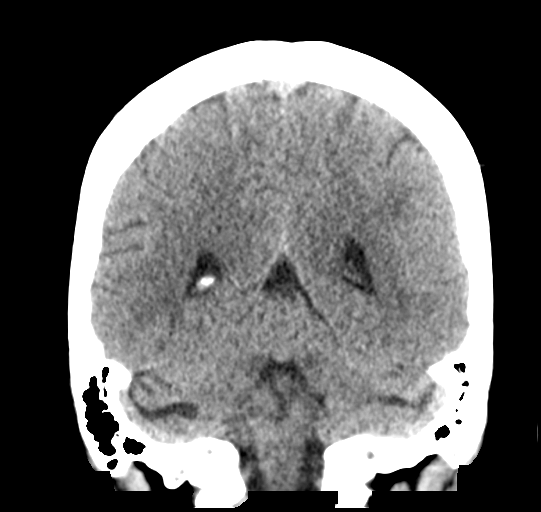
[im 30/67  brain]
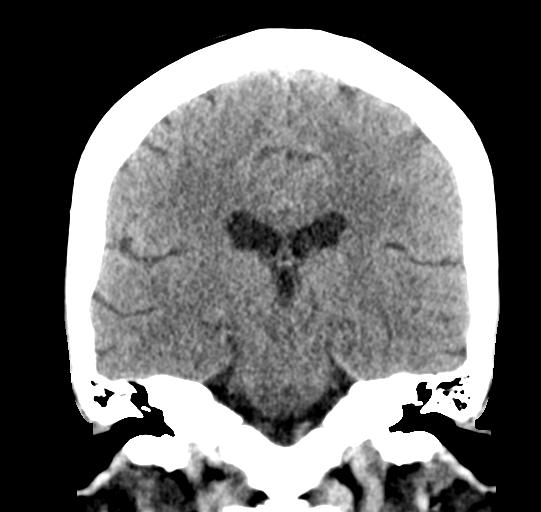
[im 37/67  brain]
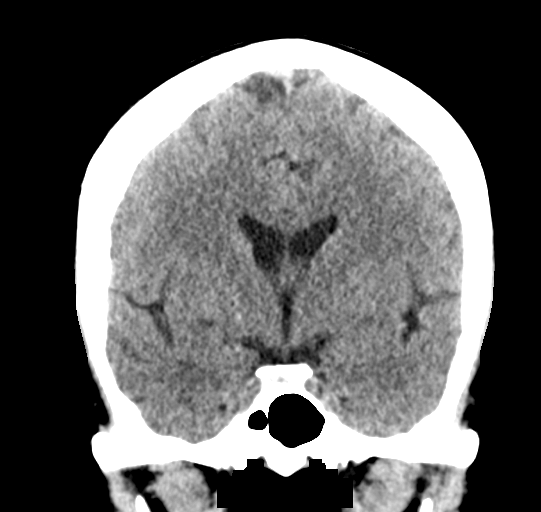

[Series 6: head 3.0 sag st · sagittal · 0.31mm/px · 3 of 56 slices shown]
[im 19/56  brain]
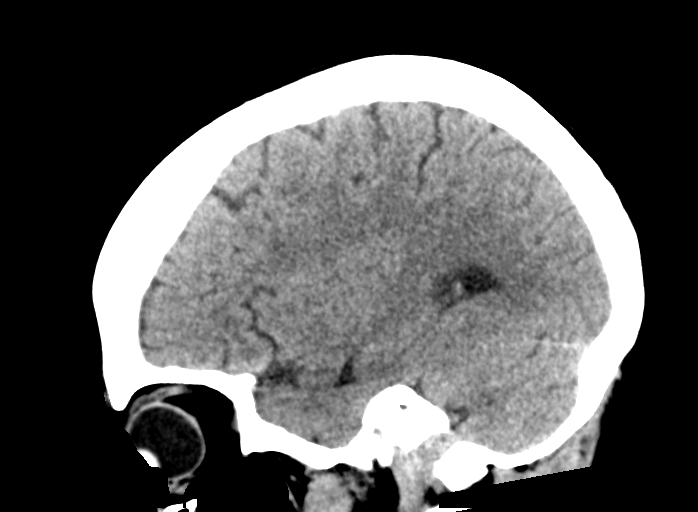
[im 28/56  brain]
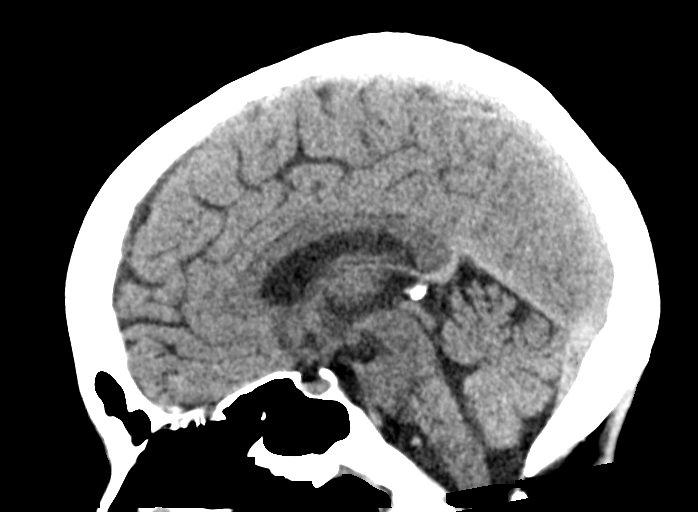
[im 37/56  brain]
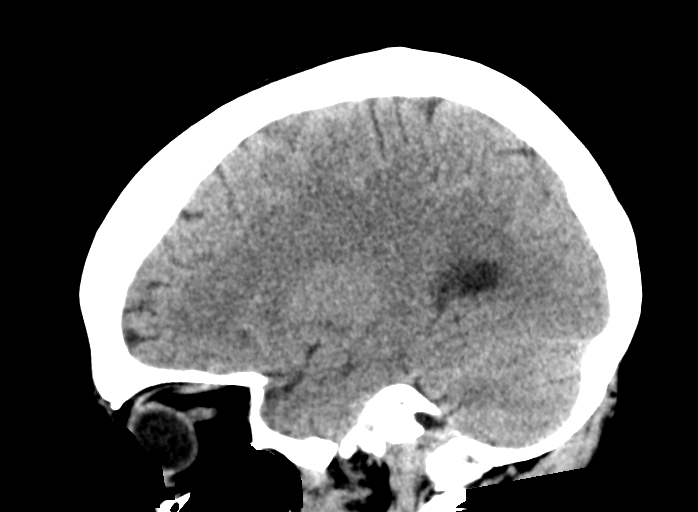

[15 of 47 positions shown; findings below may reference images not displayed]

FINDINGS: Brain: No evidence of acute large vascular territory infarction,
hemorrhage, hydrocephalus, extra-axial collection or mass
lesion/mass effect.

Vascular: No hyperdense vessel identified.

Skull: No acute fracture.

Sinuses/Orbits: Clear sinuses.

Other: No mastoid effusions.

ASPECTS ([place] Stroke Program Early CT Score) total score (0-10
with 10 being normal): 10.
IMPRESSION: No evidence of acute intracranial abnormality.  ASPECTS is 10.

Code stroke imaging results were communicated on [DATE] at [DATE] to provider GOSS via secure text paging.

## 2021-12-11 MED ORDER — ONDANSETRON HCL 4 MG PO TABS: 4.0000 mg | ORAL_TABLET | Freq: Four times a day (QID) | ORAL | Status: DC | PRN

## 2021-12-11 MED ORDER — ONDANSETRON HCL 4 MG/2ML IJ SOLN
4.0000 mg | Freq: Once | INTRAMUSCULAR | Status: AC
  Administered 2021-12-11: 4 mg via INTRAVENOUS
  Filled 2021-12-11: qty 2

## 2021-12-11 MED ORDER — SODIUM CHLORIDE 0.9% FLUSH
3.0000 mL | Freq: Once | INTRAVENOUS | Status: AC
  Administered 2021-12-11: 3 mL via INTRAVENOUS

## 2021-12-11 MED ORDER — ONDANSETRON HCL 4 MG/2ML IJ SOLN: 4.0000 mg | Freq: Four times a day (QID) | INTRAMUSCULAR | Status: DC | PRN

## 2021-12-11 MED ORDER — SODIUM CHLORIDE 0.9 % IV BOLUS
1000.0000 mL | Freq: Once | INTRAVENOUS | Status: AC
  Administered 2021-12-11: 1000 mL via INTRAVENOUS

## 2021-12-11 MED ORDER — POLYETHYLENE GLYCOL 3350 17 G PO PACK: 17.0000 g | PACK | Freq: Every day | ORAL | Status: DC | PRN

## 2021-12-11 MED ORDER — ENOXAPARIN SODIUM 40 MG/0.4ML IJ SOSY
40.0000 mg | PREFILLED_SYRINGE | INTRAMUSCULAR | Status: DC
  Administered 2021-12-11: 40 mg via SUBCUTANEOUS
  Filled 2021-12-11: qty 0.4

## 2021-12-11 MED ORDER — SODIUM CHLORIDE 0.9% FLUSH
3.0000 mL | Freq: Two times a day (BID) | INTRAVENOUS | Status: DC
  Administered 2021-12-11 – 2021-12-12 (×2): 3 mL via INTRAVENOUS

## 2021-12-11 MED ORDER — HYDROCHLOROTHIAZIDE 25 MG PO TABS
25.0000 mg | ORAL_TABLET | Freq: Every day | ORAL | Status: DC
  Administered 2021-12-12: 25 mg via ORAL
  Filled 2021-12-11: qty 1

## 2021-12-11 MED ORDER — SODIUM CHLORIDE 0.9 % IV SOLN
6.2500 mg | Freq: Four times a day (QID) | INTRAVENOUS | Status: DC | PRN
  Administered 2021-12-11: 6.25 mg via INTRAVENOUS
  Filled 2021-12-11 (×4): qty 0.25

## 2021-12-11 NOTE — ED Provider Notes (Signed)
?[facility] EMERGENCY DEPARTMENT ?Provider Note ? ? ?CSN: [id number] ?Arrival date & time: 12/11/21  1453 ? ?An emergency department physician performed an initial assessment on this suspected stroke patient at 61. ? ?History ? ?Chief Complaint  ?Patient presents with  ?? Altered Mental Status  ? ? ?Grace [name] is a 61 y.o. female. ? ?61 year old female with prior medical history as detailed below presents for evaluation.  Patient arrives by EMS as a code stroke. ? ?Patient with reported feelings of nausea throughout the morning.  The first time she contacted her husband regarding her symptoms was around 10:30 AM.  She seemed to be mentally intact at that time.  When he talked to her again around 12:25 PM she can seem to be confused and could not remember many events that she should be able to recall.  She also complained of significant nausea. ? ?Patient arrives to the ED as a code stroke.  Dr. Iver Nestle with neuro evaluates the patient on arrival. ? ?The history is provided by the patient, medical records and the spouse.  ?Altered Mental Status ?Presenting symptoms: disorientation   ?Severity:  Moderate ?Most recent episode:  Today ?Episode history:  Single ?Duration:  3 hours ?Timing:  Constant ?Progression:  Waxing and waning ?Chronicity:  New ? ?  ? ?Home Medications ?Prior to Admission medications   ?Medication Sig Start Date End Date Taking? Authorizing Provider  ?acetaminophen (TYLENOL) 500 MG tablet Take 1,000 mg by mouth in the morning and at bedtime.    [provider]  ?azithromycin (ZITHROMAX Z-PAK) 250 MG tablet Take 2 pills by mouth today and then 1 pill daily for 4 days 11/28/21   Tower, Audrie Gallus, MD  ?baclofen (LIORESAL) 10 MG tablet Take 10 mg by mouth at bedtime as needed for muscle spasms.    [provider]  ?Calcium Carbonate-Vitamin D (CALCIUM-D PO) Take 1 tablet by mouth daily.    [provider]  ?DULoxetine (CYMBALTA) 30 MG capsule TAKE 2 CAPSULES  BY MOUTH EVERY MORNING AND TAKE 1 CAPSULE EVERY EVENING    [provider]  ?estradiol (VIVELLE-DOT) 0.0375 MG/24HR estradiol 0.0375 mg/24 hr semiweekly transdermal patch ? APPLY 1 PATCH TWICE WEEKLY    [provider]  ?fluconazole (DIFLUCAN) 150 MG tablet Take one pill now by mouth and one pill in 5 days 11/28/21   Tower, Audrie Gallus, MD  ?hydrochlorothiazide (HYDRODIURIL) 25 MG tablet Take 25 mg by mouth daily.    [provider]  ?IMVEXXY MAINTENANCE PACK 10 MCG INST SMARTSIG:1 Insert Vaginal Twice a Week 12/09/20   [provider]  ?latanoprost (XALATAN) 0.005 % ophthalmic solution Place 1 drop into both eyes at bedtime.    [provider]  ?Multiple Vitamin (MULTIVITAMIN) tablet Take 1 tablet by mouth daily.    [provider]  ?Naproxen Sodium (ALEVE) 220 MG CAPS Take 1 capsule by mouth in the morning and at bedtime.    [provider]  ?timolol (TIMOPTIC) 0.5 % ophthalmic solution 1 drop 2 (two) times daily. 12/27/20   [provider]  ?   ? ?Allergies    ?Celecoxib, Codeine, Morphine, Propoxyphene n-acetaminophen, Tramadol hcl, Pneumococcal vaccines, and Sulfa antibiotics   ? ?Review of Systems   ?Review of Systems  ?All other systems reviewed and are negative. ? ?Physical Exam ?Updated Vital Signs ?BP 134/78 (BP Location: Left Arm)   Wt 62.8 kg   LMP 08/20/1988   BMI 23.76 kg/m?  ?Physical Exam ?Vitals and  nursing note reviewed.  ?Constitutional:   ?   General: She is not in acute distress. ?   Appearance: Normal appearance. She is well-developed.  ?HENT:  ?   Head: Normocephalic and atraumatic.  ?Eyes:  ?   Conjunctiva/sclera: Conjunctivae normal.  ?   Pupils: Pupils are equal, round, and reactive to light.  ?Cardiovascular:  ?   Rate and Rhythm: Normal rate and regular rhythm.  ?   Heart sounds: Normal heart sounds.  ?Pulmonary:  ?   Effort: Pulmonary effort is normal. No respiratory distress.  ?   Breath sounds: Normal breath sounds.   ?Abdominal:  ?   General: There is no distension.  ?   Palpations: Abdomen is soft.  ?   Tenderness: There is no abdominal tenderness.  ?Musculoskeletal:     ?   General: No deformity. Normal range of motion.  ?   Cervical back: Normal range of motion and neck supple.  ?Skin: ?   General: Skin is warm and dry.  ?Neurological:  ?   General: No focal deficit present.  ?   Mental Status: She is alert and oriented to person, place, and time.  ?   Comments: Patient is grossly intact neurologically.  She is unable to recall the current month. ? ?NIHSS score of 1  ? ? ?ED Results / Procedures / Treatments   ?Labs ?(all labs ordered are listed, but only abnormal results are displayed) ?Labs Reviewed  ?CBC - Abnormal; Notable for the following components:  ?    Result Value  ? WBC 14.7 (*)   ? Hemoglobin 15.2 (*)   ? All other components within normal limits  ?DIFFERENTIAL - Abnormal; Notable for the following components:  ? Neutro Abs 13.9 (*)   ? Lymphs Abs 0.2 (*)   ? All other components within normal limits  ?COMPREHENSIVE METABOLIC PANEL - Abnormal; Notable for the following components:  ? Glucose, Bld 124 (*)   ? AST 48 (*)   ? ALT 51 (*)   ? All other components within normal limits  ?I-STAT CHEM 8, ED - Abnormal; Notable for the following components:  ? Glucose, Bld 117 (*)   ? Calcium, Ion 1.11 (*)   ? Hemoglobin 16.3 (*)   ? HCT 48.0 (*)   ? All other components within normal limits  ?CBG MONITORING, ED - Abnormal; Notable for the following components:  ? Glucose-Capillary 126 (*)   ? All other components within normal limits  ?URINE CULTURE  ?PROTIME-INR  ?APTT  ?URINALYSIS, ROUTINE W REFLEX MICROSCOPIC  ?RAPID URINE DRUG SCREEN, HOSP PERFORMED  ?MAGNESIUM  ? ? ?EKG ?EKG Interpretation ? ?Date/Time:  Monday December 11 2021 15:10:37 EDT ?Ventricular Rate:  96 ?PR Interval:  137 ?QRS Duration: 88 ?QT Interval:  368 ?QTC Calculation: 465 ?R Axis:   94 ?Text Interpretation: Sinus rhythm Right atrial enlargement Right  axis deviation Confirmed by Kristine RoyalMessick, Massey Ruhland [phone]([id number]) on 12/11/2021 3:11:52 PM ? ?Radiology ?MR BRAIN WO CONTRAST ? ?Result Date: 12/11/2021 ?CLINICAL DATA:  Delirium, patient states she blacked out EXAM: MRI HEAD WITHOUT CONTRAST TECHNIQUE: Multiplanar, multiecho pulse sequences of the brain and surrounding structures were obtained without intravenous contrast. COMPARISON:  Same-day noncontrast CT head FINDINGS: Brain: There is no acute intracranial hemorrhage, extra-axial fluid collection, or acute infarct. Parenchymal volume is normal. The ventricles are normal in size. Gray-white differentiation is preserved. Patchy foci of FLAIR signal abnormality in the subcortical and periventricular white matter likely reflects sequela of mild chronic white  matter microangiopathy. There is no suspicious parenchymal signal abnormality. There is no solid mass lesion. There is no mass effect or midline shift. Vascular: Normal flow voids. Skull and upper cervical spine: Normal marrow signal. Sinuses/Orbits: Paranasal sinuses are clear. The globes and orbits are unremarkable. Other: None. IMPRESSION: No acute intracranial pathology. Electronically Signed   By: Lesia Hausen M.D.   On: 12/11/2021 17:14  ? ?CT HEAD CODE STROKE WO CONTRAST ? ?Result Date: 12/11/2021 ?CLINICAL DATA:  Code stroke.  Neuro deficit, acute, stroke suspected EXAM: CT HEAD WITHOUT CONTRAST TECHNIQUE: Contiguous axial images were obtained from the base of the skull through the vertex without intravenous contrast. RADIATION DOSE REDUCTION: This exam was performed according to the departmental dose-optimization program which includes automated exposure control, adjustment of the mA and/or kV according to patient size and/or use of iterative reconstruction technique. COMPARISON:  None. FINDINGS: Brain: No evidence of acute large vascular territory infarction, hemorrhage, hydrocephalus, extra-axial collection or mass lesion/mass effect. Vascular: No hyperdense vessel  identified. Skull: No acute fracture. Sinuses/Orbits: Clear sinuses. Other: No mastoid effusions. ASPECTS [facility] Stroke Program Early CT Score) total score (0-10 with 10 being normal): 10. IMPRESSION: No

## 2021-12-11 NOTE — Consult Note (Signed)
Neurology Consultation ?Reason for Consult: Code stroke, acute onset confusion ?Requesting Physician: Kristine Royal ? ?CC: Nausea and not feeling well, "Like I was dreaming I was sick and I was trying to tell someone" ? ?History is obtained from: Patient, husband at bedside, EMS, chart review ? ?HPI: Grace [name] is a 61 y.o. female with a past medical history significant for hypertension, hyperlipidemia, chronic pain on duloxetine, anxiety. ? ?She has been in her normal state of health other than starting a course of azithromycin on 4/11 for sinusitis which she completed last week.  She had a normal weekend with some company over, but today was having some emesis.  She has some difficulty remembering recent events and describing her symptoms, just repeatedly saying that she felt like she was dreaming that she was sick but also knew she was awake but was trying to tell someone and having difficulty communicating.  Emesis was nonbloody.  Husband notes that in the past she has had loss of consciousness with emesis, but they are not sure if she lost consciousness today, she was seated on the toilet when he arrived and did not appear to have fallen.  Nothing was in disarray or unusual about the home when he arrived, but due to her repetitive questions and confusion EMS was activated. ? ?Husband reports her only medications currently are duloxetine (60 in the morning, 30 at night), which has been helping her chronic pain, as well as hydrochlorothiazide which he describes as a fluid pill and not for blood pressure ? ?LKW: 11:25 AM she was last speaking normally to her husband ?tPA given?: No, low concern for stroke, NIH of 1 for not knowing the month (too mild to treat) ?Premorbid modified rankin scale: 0-1 ?    0 - No symptoms. ?    1 - No significant disability. Able to carry out all usual activities, despite some symptoms. ? ?ROS: Limited review of systems secondary to her confusion, as detailed above ? ?Past Medical  History:  ?Diagnosis Date  ? Allergic rhinitis, cause unspecified   ? Back pain   ? epidural injections  ? Foot fracture   ? Headache(784.0)   ? Nonspecific elevation of levels of transaminase or lactic acid dehydrogenase (LDH)   ? Pain in joint, site unspecified   ? Symptomatic menopausal or female climacteric states   ? Unspecified gastritis and gastroduodenitis without mention of hemorrhage   ? Unspecified sinusitis (chronic)   ? ?Past Surgical History:  ?Procedure Laterality Date  ? CCY  2006  ? COLONOSCOPY    ? ESOPHAGOGASTRODUODENOSCOPY  2006  ? gastritis  ? FOOT FRACTURE SURGERY  2010  ? ? ? ?Family History  ?Problem Relation Age of Onset  ? Other Father   ?     disk disease  ? Arthritis Unknown   ?     paternal side  ? Other Brother   ?     disk disease  ? ?Social History:  reports that she has never smoked. She has never used smokeless tobacco. She reports that she does not drink alcohol and does not use drugs. ? ?Exam: ?Current vital signs: ?BP 134/78 (BP Location: Left Arm)   Wt 62.8 kg   LMP 08/20/1988   BMI 23.76 kg/m?  ?Vital signs in last 24 hours: ?BP: (134)/(78) 134/78 (04/24 1500) ?Weight:  [62.8 kg] 62.8 kg (04/24 1500) ? ? ?Physical Exam  ?Constitutional: Appears well-developed and well-nourished.  ?Psych: Affect appropriate to situation, calm and cooperative, mildly  anxious ?Eyes: No scleral injection ?HENT: No oropharyngeal obstruction.  ?MSK: no joint deformities.  ?Cardiovascular: Cool hands with poor perfusion, dusky violet in color and blanching.  Patient reports this is not an uncommon appearance of her hands ?Respiratory: Effort normal, non-labored breathing ?GI: Soft.  No distension. There is no tenderness.  ?Skin: No obvious lesions, discoloration as described under cardiovascular ? ?Neuro: ?Mental Status: ?Patient is awake, alert, oriented to person, year, day of the week, age (slow to respond but correct).  She is able to give limited history about recent events, but is able to  name and repeat ?Cranial Nerves: ?II: Visual Fields are full. Pupils are equal, round, and reactive to light.   ?III,IV, VI: EOMI without ptosis or diploplia.  ?V: Facial sensation is symmetric to temperature ?VII: Facial movement is symmetric.  ?VIII: hearing is intact to voice ?X: Uvula elevates symmetrically ?XI: Shoulder shrug is symmetric. ?XII: tongue is midline without atrophy or fasciculations.  ?Motor: ?Tone is normal. Bulk is normal. 5/5 strength was present in all four extremities.  No pronator drift.  No drift of the bilateral lower extremities ?Sensory: ?Sensation is symmetric to light touch in the arms and legs.  No extinction to double simultaneous stimuli ?Deep Tendon Reflexes: ?2+ and symmetric in the brachioradialis and patellae.  ?Plantars: ?Toes are downgoing bilaterally.  ?Cerebellar: ?FNF and HKS are intact bilaterally ?Gait:  ?Deferred in acute setting  ? ?NIHSS total 1 ?Score breakdown: Did not answer month correctly ?Performed at time of patient arrival to ED just prior to CT scan ? ? ?I have reviewed labs in epic and the results pertinent to this consultation are: ? ?Basic Metabolic Panel: ?Recent Labs  ?Lab 12/11/21 ?1500  ?NA 139  ?K 3.5  ?CL 101  ?GLUCOSE 117*  ?BUN 23  ?CREATININE 0.60  ? ? ?CBC: ?Recent Labs  ?Lab 12/11/21 ?1500  ?WBC 14.7*  ?NEUTROABS 13.9*  ?HGB 15.2*  16.3*  ?HCT 45.2  48.0*  ?MCV 94.4  ?PLT 247  ? ? ?Coagulation Studies: ?Recent Labs  ?  12/11/21 ?1500  ?LABPROT 12.0  ?INR 0.9  ?  ? ? ?I have reviewed the images obtained:  ? ?CT head personally reviewed, there is no acute intracranial process, she does appear to have a focus of mineralization in the right basal ganglia ? ? ?Impression: This is a 61 year old woman presenting with acute onset confusion in the setting of nausea/vomiting.  At this time the differential is somewhat broad and includes infection related confusion (she does have a leukocytosis), versus less likely seizure versus transient global amnesia  versus anxiety state (which is a diagnosis of exclusion) ? ?Recommendations: ?-Infectious/toxic metabolic work-up per ED ?-MRI brain without contrast ?-EEG ?-If patient is rapidly returning to baseline and MRI and EEG are reassuring, from a neurological perspective she may be discharged with close outpatient follow-up, otherwise she should be observed for return to baseline ?-Medical clearance per ED/primary team ? ? ?Brooke Dare MD-PhD ?[facility] ?[phone] ?Available 7 AM to 7 PM, outside these hours please contact Neurologist on call listed on AMION  ? ? ?Total critical care time: 45 minutes ?  ?Critical care time was exclusive of separately billable procedures and treating other patients. ?  ?Critical care was necessary to treat or prevent imminent or life-threatening deterioration -she was evaluated for acute change in mental status with potential for treatment with TNK ?  ?Critical care was time spent personally by me on the following activities: development of treatment  plan with patient and/or surrogate as well as nursing, discussions with consultants/primary team, evaluation of patient's response to treatment, examination of patient, obtaining history from patient or surrogate, ordering and performing treatments and interventions, ordering and review of laboratory studies, ordering and review of radiographic studies, and re-evaluation of patient's condition as needed, as documented above.  ?  ? ?

## 2021-12-11 NOTE — Code Documentation (Signed)
Stroke Response Nurse Documentation ?Code Documentation ? ?Grace [name] is a 61 y.o. female arriving to [facility]  via [facility] on 12/11/21 with past medical hx of HTN, back pain, sinusitis (chronic) with recent antibiotic treatment. On No antithrombotic. Code stroke was activated by EMS.  ? ?Patient from home where she was LKW at 1130 when she sent a text to her husband with proper information. She then talked to him at 1230 confused and unable to remember. Husband arrived home shortly after about 1300. Noted that patient woke up complaining of an upset stomach and vomited prior to husband leaving that morning at 0800.  Patient/husband states she continued to be nauseated and vomit throughout the day until arrival. Vomit was dark green in color.  ? ?Stroke team at the bedside on patient arrival. Labs drawn and patient cleared for CT by Dr. Francia Greaves. Patient to CT with team. NIHSS 1, see documentation for details and code stroke times. Patient with disoriented on exam.  ? ?The following imaging was completed:  CT Head. Patient is not a candidate for IV Thrombolytic due to . Patient is not a candidate for IR due to no LVO. ? ?Care Plan: q2h mNIHSS and vitals. ? ?Bedside handoff with ED RN Reba.   ? ?Newman Nickels  ?Stroke Response RN ? ? ?

## 2021-12-11 NOTE — Progress Notes (Signed)
Patient is on her way to MRI. Will hook up when she comes back.  ?

## 2021-12-11 NOTE — H&P (Signed)
?History and Physical  ? ?[name] [id number] DOB: 08-26-1960 DOA: 12/11/2021 ? ?PCP: Abner Greenspan, MD  ? ?Patient coming from: Home ? ?Chief Complaint: Altered mental status ? ?HPI: Grace [name] is a 61 y.o. female with medical history significant of headaches, transaminitis, degenerative disc disease, anxiety, chronic pain, gastritis presenting with altered mental status. ? ?History obtained with assistance of family and chart review as well as patient.  Patient was experiencing nausea vomiting this morning.  She seemed to be herself when speaking with her husband on the phone at around 10:30 AM.  When they spoke again around 12:25 PM she was noted to be confused and forgetting previous events that she would normally remember.  She initially arrived to the ED as a code stroke.  This was ruled out with imaging.  She has recently completed a course of antibiotics for sinusitis but otherwise has been at her baseline medically. ? ?She denies fevers, chills, chest pain, shortness of breath, abdominal pain, constipation, diarrhea.  ? ?ED Course: Vital signs in ED stable.  Lab work-up included CMP with glucose 124, AST 48, ALT 51.  CBC with leukocytosis to 14.7.  PT, PTT, INR within normal limits.  Urinalysis, urine cultures, UDS pending.  Magnesium normal.  Chest x-ray pending.  CT head and MRI brain within normal limits.  Patient received Zofran and a liter of fluids in the ED.  Her mentation has reportedly been improving in the ED but not yet back to baseline.  Neurology was consulted as patient arrived as a code stroke and after initial imaging came back negative, requested EEG and further infectious work-up. ? ?Review of Systems: As per HPI otherwise all other systems reviewed and are negative. ? ?Past Medical History:  ?Diagnosis Date  ? Allergic rhinitis, cause unspecified   ? Back pain   ? epidural injections  ? Foot fracture   ? Headache(784.0)   ? Nonspecific elevation of levels of transaminase or  lactic acid dehydrogenase (LDH)   ? Pain in joint, site unspecified   ? Symptomatic menopausal or female climacteric states   ? Unspecified gastritis and gastroduodenitis without mention of hemorrhage   ? Unspecified sinusitis (chronic)   ? ? ?Past Surgical History:  ?Procedure Laterality Date  ? CCY  2006  ? COLONOSCOPY    ? ESOPHAGOGASTRODUODENOSCOPY  2006  ? gastritis  ? FOOT FRACTURE SURGERY  2010  ? ? ?Social History ? reports that she has never smoked. She has never used smokeless tobacco. She reports that she does not drink alcohol and does not use drugs. ? ?Allergies  ?Allergen Reactions  ? Celecoxib Hives and Nausea And Vomiting  ?  rash, migraine  ? Codeine Nausea And Vomiting  ?  rash, Syncope, Migraine HA ?  ? Morphine Hives and Itching  ? Propoxyphene N-Acetaminophen Hives and Itching  ? Tramadol Hcl Hives and Itching  ? Pneumococcal Vaccines Rash  ?  Local reaction on arm. Redness swelling and rash  ? Sulfa Antibiotics Rash  ? ? ?Family History  ?Problem Relation Age of Onset  ? Other Father   ?     disk disease  ? Arthritis Unknown   ?     paternal side  ? Other Brother   ?     disk disease  ?Reviewed on admission ? ?Prior to Admission medications   ?Medication Sig Start Date End Date Taking? Authorizing Provider  ?acetaminophen (TYLENOL) 500 MG tablet Take 1,000 mg by mouth in the  morning and at bedtime.    [provider]  ?baclofen (LIORESAL) 10 MG tablet Take 10 mg by mouth at bedtime as needed for muscle spasms.    [provider]  ?Calcium Carbonate-Vitamin D (CALCIUM-D PO) Take 1 tablet by mouth daily.    [provider]  ?DULoxetine (CYMBALTA) 30 MG capsule TAKE 2 CAPSULES BY MOUTH EVERY MORNING AND TAKE 1 CAPSULE EVERY EVENING    [provider]  ?estradiol (VIVELLE-DOT) 0.0375 MG/24HR estradiol 0.0375 mg/24 hr semiweekly transdermal patch ? APPLY 1 PATCH TWICE WEEKLY    [provider]  ?hydrochlorothiazide (HYDRODIURIL) 25 MG tablet Take 25 mg by  mouth daily.    [provider]  ?IMVEXXY MAINTENANCE PACK 10 MCG INST SMARTSIG:1 Insert Vaginal Twice a Week 12/09/20   [provider]  ?latanoprost (XALATAN) 0.005 % ophthalmic solution Place 1 drop into both eyes at bedtime.    [provider]  ?Multiple Vitamin (MULTIVITAMIN) tablet Take 1 tablet by mouth daily.    [provider]  ?Naproxen Sodium (ALEVE) 220 MG CAPS Take 1 capsule by mouth in the morning and at bedtime.    [provider]  ?timolol (TIMOPTIC) 0.5 % ophthalmic solution 1 drop 2 (two) times daily. 12/27/20   [provider]  ? ? ?Physical Exam: ?Vitals:  ? 12/11/21 1500 12/11/21 1600 12/11/21 1800  ?BP: 134/78 137/71 136/74  ?Pulse:  86   ?Resp:  17 18  ?SpO2:  100%   ?Weight: 62.8 kg    ? ? ?Physical Exam ?Constitutional:   ?   General: She is not in acute distress. ?   Appearance: Normal appearance.  ?HENT:  ?   Head: Normocephalic and atraumatic.  ?   Mouth/Throat:  ?   Mouth: Mucous membranes are moist.  ?   Pharynx: Oropharynx is clear.  ?Eyes:  ?   Extraocular Movements: Extraocular movements intact.  ?   Pupils: Pupils are equal, round, and reactive to light.  ?Cardiovascular:  ?   Rate and Rhythm: Normal rate and regular rhythm.  ?   Pulses: Normal pulses.  ?   Heart sounds: Normal heart sounds.  ?Pulmonary:  ?   Effort: Pulmonary effort is normal. No respiratory distress.  ?   Breath sounds: Normal breath sounds.  ?Abdominal:  ?   General: Bowel sounds are normal. There is no distension.  ?   Palpations: Abdomen is soft.  ?   Tenderness: There is no abdominal tenderness.  ?Musculoskeletal:     ?   General: No swelling or deformity.  ?Skin: ?   General: Skin is warm and dry.  ?Neurological:  ?   General: No focal deficit present.  ?   Comments: Near baseline, is alert and oriented, husband can detect that she is not quite at baseline but otherwise seems appropriate on exam.  She does continue to have some memory loss of events from  midmorning until early afternoon today.  ? ?Labs on Admission: I have personally reviewed following labs and imaging studies ? ?CBC: ?Recent Labs  ?Lab 12/11/21 ?1500  ?WBC 14.7*  ?NEUTROABS 13.9*  ?HGB 15.2*  16.3*  ?HCT 45.2  48.0*  ?MCV 94.4  ?PLT 247  ? ? ?Basic Metabolic Panel: ?Recent Labs  ?Lab 12/11/21 ?1500  ?NA 139  139  ?K 3.5  3.5  ?CL 103  101  ?CO2 27  ?GLUCOSE 124*  117*  ?BUN 19  23  ?CREATININE 0.68  0.60  ?CALCIUM 9.0  ?  MG 1.9  ? ? ?GFR: ?Estimated Creatinine Clearance: 63 mL/min (by C-G formula based on SCr of 0.68 mg/dL). ? ?Liver Function Tests: ?Recent Labs  ?Lab 12/11/21 ?1500  ?AST 48*  ?ALT 51*  ?ALKPHOS 42  ?BILITOT 0.8  ?PROT 6.7  ?ALBUMIN 4.0  ? ? ?Urine analysis: ?No results found for: COLORURINE, APPEARANCEUR, [place], [place], [place], [place], [place], KETONESUR, PROTEINUR, [place], NITRITE, LEUKOCYTESUR ? ?Radiological Exams on Admission: ?MR BRAIN WO CONTRAST ? ?Result Date: 12/11/2021 ?CLINICAL DATA:  Delirium, patient states she blacked out EXAM: MRI HEAD WITHOUT CONTRAST TECHNIQUE: Multiplanar, multiecho pulse sequences of the brain and surrounding structures were obtained without intravenous contrast. COMPARISON:  Same-day noncontrast CT head FINDINGS: Brain: There is no acute intracranial hemorrhage, extra-axial fluid collection, or acute infarct. Parenchymal volume is normal. The ventricles are normal in size. Gray-white differentiation is preserved. Patchy foci of FLAIR signal abnormality in the subcortical and periventricular white matter likely reflects sequela of mild chronic white matter microangiopathy. There is no suspicious parenchymal signal abnormality. There is no solid mass lesion. There is no mass effect or midline shift. Vascular: Normal flow voids. Skull and upper cervical spine: Normal marrow signal. Sinuses/Orbits: Paranasal sinuses are clear. The globes and orbits are unremarkable. Other: None. IMPRESSION: No acute intracranial pathology.  Electronically Signed   By: Valetta Mole M.D.   On: 12/11/2021 17:14  ? ?CT HEAD CODE STROKE WO CONTRAST ? ?Result Date: 12/11/2021 ?CLINICAL DATA:  Code stroke.  Neuro deficit, acute, stroke suspected EXAM: CT HEAD W

## 2021-12-11 NOTE — ED Notes (Signed)
Pt back from MRI 

## 2021-12-11 NOTE — Plan of Care (Signed)
No charge plan of care note, same-day progress note ? ?Labs reveal worsening leukocytosis, no clear source of infection at this time.  In discussion with the primary team the patient's last dose of Plavix was on 4/21 at her nursing facility.  If she is not improving and depending on the rest of her work-up, the earliest it would be safe to do a lumbar puncture would be 4/26. ? ?Appreciate further infectious work-up per primary team.  Neurology will continue to follow. ? ?Brooke Dare MD-PhD ?[facility] ?[phone] ?Available 7 AM to 7 PM, outside these hours please contact Neurologist on call listed on AMION  ? ?

## 2021-12-11 NOTE — ED Triage Notes (Signed)
Ems reports that pt had been feeling sick most of the morning and she called her husband multiple times. The first around 1030am and she was feeling sick but fine, when he talked to her again at 1225pm and the pt seemed like she was confused and couldn't remember anything and still c/o being sick to her stomach. ? ?Pt states "it felt like I was dreaming and I was sick but I couldn't tell anyone what was wrong with me, but it felt like it was just a dream." Pt c/o nausea and vomiting.  ? ?Pt could not answer what month it was and doesn't really remember what happened this weekend.  ?

## 2021-12-11 NOTE — ED Notes (Signed)
Pt to MRI

## 2021-12-11 NOTE — Progress Notes (Signed)
EEG complete - results pending 

## 2021-12-12 DIAGNOSIS — R410 Disorientation, unspecified: Secondary | ICD-10-CM

## 2021-12-12 DIAGNOSIS — A084 Viral intestinal infection, unspecified: Secondary | ICD-10-CM | POA: Diagnosis not present

## 2021-12-12 DIAGNOSIS — R7989 Other specified abnormal findings of blood chemistry: Secondary | ICD-10-CM

## 2021-12-12 DIAGNOSIS — G9340 Encephalopathy, unspecified: Secondary | ICD-10-CM

## 2021-12-12 LAB — VITAMIN B12: Vitamin B-12: 1001 pg/mL — ABNORMAL HIGH (ref 180–914)

## 2021-12-12 LAB — URINE CULTURE: Culture: NO GROWTH

## 2021-12-12 LAB — COMPREHENSIVE METABOLIC PANEL
ALT: 58 U/L — ABNORMAL HIGH (ref 0–44)
AST: 52 U/L — ABNORMAL HIGH (ref 15–41)
Albumin: 3 g/dL — ABNORMAL LOW (ref 3.5–5.0)
Alkaline Phosphatase: 38 U/L (ref 38–126)
Anion gap: 6 (ref 5–15)
BUN: 17 mg/dL (ref 8–23)
CO2: 28 mmol/L (ref 22–32)
Calcium: 7.6 mg/dL — ABNORMAL LOW (ref 8.9–10.3)
Chloride: 103 mmol/L (ref 98–111)
Creatinine, Ser: 0.69 mg/dL (ref 0.44–1.00)
GFR, Estimated: >60 mL/min (ref >60)
Glucose, Bld: 109 mg/dL — ABNORMAL HIGH (ref 70–99)
Potassium: 3 mmol/L — ABNORMAL LOW (ref 3.5–5.1)
Sodium: 137 mmol/L (ref 135–145)
Total Bilirubin: 0.6 mg/dL (ref 0.3–1.2)
Total Protein: 5.4 g/dL — ABNORMAL LOW (ref 6.5–8.1)

## 2021-12-12 LAB — GAMMA GT: GGT: 19 U/L (ref 7–50)

## 2021-12-12 LAB — CBC
HCT: 37.4 % (ref 36.0–46.0)
Hemoglobin: 12.5 g/dL (ref 12.0–15.0)
MCH: 31.6 pg (ref 26.0–34.0)
MCHC: 33.4 g/dL (ref 30.0–36.0)
MCV: 94.4 fL (ref 80.0–100.0)
Platelets: 224 10*3/uL (ref 150–400)
RBC: 3.96 MIL/uL (ref 3.87–5.11)
RDW: 13.5 % (ref 11.5–15.5)
WBC: 9.4 10*3/uL (ref 4.0–10.5)
nRBC: 0 % (ref 0.0–0.2)

## 2021-12-12 LAB — CK: Total CK: 35 U/L — ABNORMAL LOW (ref 38–234)

## 2021-12-12 LAB — AMMONIA: Ammonia: 15 umol/L (ref 9–35)

## 2021-12-12 LAB — TSH: TSH: 0.772 u[IU]/mL (ref 0.350–4.500)

## 2021-12-12 MED ORDER — ONDANSETRON HCL 4 MG PO TABS: 4.0000 mg | ORAL_TABLET | Freq: Four times a day (QID) | ORAL | 0 refills | Status: DC | PRN

## 2021-12-12 MED ORDER — HYDROCHLOROTHIAZIDE 25 MG PO TABS: 25.0000 mg | ORAL_TABLET | Freq: Every day | ORAL | Status: AC

## 2021-12-12 MED ORDER — SODIUM CHLORIDE 0.9 % IV BOLUS
1000.0000 mL | Freq: Once | INTRAVENOUS | Status: AC
Start: 2021-12-12 — End: 2021-12-12
  Administered 2021-12-12: 1000 mL via INTRAVENOUS

## 2021-12-12 MED ORDER — DULOXETINE HCL 30 MG PO CPEP: 30.0000 mg | ORAL_CAPSULE | Freq: Two times a day (BID) | ORAL | Status: AC

## 2021-12-12 MED ORDER — POTASSIUM CHLORIDE CRYS ER 20 MEQ PO TBCR
40.0000 meq | EXTENDED_RELEASE_TABLET | Freq: Two times a day (BID) | ORAL | Status: DC
  Administered 2021-12-12: 40 meq via ORAL
  Filled 2021-12-12: qty 2

## 2021-12-12 NOTE — ED Notes (Signed)
Ambulatory to b/r, steady gait. Family at BS.  

## 2021-12-12 NOTE — Progress Notes (Signed)
OT Cancellation Note ? ?Patient Details ?Name: Grace [name] ?MRN: [id number] ?DOB: October 23, 1960 ? ? ?Cancelled Treatment:    Reason Eval/Treat Not Completed: OT screened, no needs identified, will sign off. Per PT and pt report, patient at baseline level for ADLs, mobility and cognition.  Imaging negative as well.  No OT needs identified at this time, OT will sign off.  ? ?Barry Brunner, OT ?[facility] ?Pager [phone] ?Office [phone] ? ? ?Chancy Milroy ?12/12/2021, 1:10 PM ?

## 2021-12-12 NOTE — ED Notes (Signed)
Up to b/r, steady gait. Husband at side.  ?

## 2021-12-12 NOTE — Discharge Summary (Signed)
?Physician Discharge Summary ?  ?Patient: Grace LeatherwoodWanda I Stacks MRN: [id number] DOB: 04/17/1961  ?Admit date:     12/11/2021  ?Discharge date: 12/12/21  ?Discharge Physician: Marguerita Merlesmair Averyana Pillars, DO  ? ?PCP: Judy Pimpleower, Marne A, MD  ? ?Recommendations at discharge:  ? ?Follow up with PCP within 1-2 weeks and repeat CBC, CMP, Mag, Phos within 1 week ?Follow up with Neurology in the outpatient setting and discuss with them about going back on Duloxetine 60 mg in the AM and 30 mg qHS ?Follow up with PCP/Gastroenterology in the outpatient setting for Monitoring of LFTs and further workup if they remain elevated ?Follow-up on infectious studies are still pending including blood cultures x2, ? ?Discharge Diagnoses: ?Principal Problem: ?  Acute encephalopathy ? ?Resolved Problems: ?  * No resolved hospital problems. * ? ?Hospital Course: ?HPI per Dr. Beola CordAlexander Melvin on 12/11/21 ? Grace LeatherwoodWanda I Tyminski is a 61 y.o. female with medical history significant of headaches, transaminitis, degenerative disc disease, anxiety, chronic pain, gastritis presenting with altered mental status. ? ?History obtained with assistance of family and chart review as well as patient.  Patient was experiencing nausea vomiting this morning.  She seemed to be herself when speaking with her husband on the phone at around 10:30 AM.  When they spoke again around 12:25 PM she was noted to be confused and forgetting previous events that she would normally remember.  She initially arrived to the ED as a code stroke.  This was ruled out with imaging.  She has recently completed a course of antibiotics for sinusitis but otherwise has been at her baseline medically. ? ?She denies fevers, chills, chest pain, shortness of breath, abdominal pain, constipation, diarrhea.  ?  ?ED Course: Vital signs in ED stable.  Lab work-up included CMP with glucose 124, AST 48, ALT 51.  CBC with leukocytosis to 14.7.  PT, PTT, INR within normal limits.  Urinalysis, urine cultures, UDS pending.  Magnesium  normal.  Chest x-ray pending.  CT head and MRI brain within normal limits.  Patient received Zofran and a liter of fluids in the ED.  Her mentation has reportedly been improving in the ED but not yet back to baseline.  Neurology was consulted as patient arrived as a code stroke and after initial imaging came back negative, requested EEG and further infectious work-up. ? ?**Interim History ?The patient subsequently improved and neurology evaluated and her MRI brain was reassuring EEG showed no evidence of seizures.  The neurologist recommended decreasing duloxetine to 30 mg twice daily and discussed with PCP about repeating LFTs.  Patient was deemed stable for discharge and PT OT evaluated.  Prior to discharge she was given additional 1 L bolus and was told to resume her hydrochlorothiazide in a few days.  Overall she is improved significantly and back to her baseline but still feels a little weak.  She also complained about some diarrhea but this is not bad.  She will need further infectious work-up for diarrhea if she continues to have an outpatient setting given that she is medically stable to be discharged at this time. ? ?Assessment and Plan: ? ?Leukocytosis, improved  ?Acute encephalopathy, improved and likely in the setting of dehydration from viral gastroenteritis ?> Presenting from home after noted to become confused and forgetful sometime between 10:30 AM and 12 point 5 PM today.  Also experiencing nausea vomiting today. ?> Initially arrived as code stroke for this was ruled out with imaging (normal CT head and MRI brain). ?> Neurology consulted  and recommending EEG in addition to imaging above.  Also recommending further infectious work-up given leukocytosis. (No infectious etiology yet identified, unclear if there could be a degree of reactive leukocytosis). ?> Patient has had improvement in the ED, has no neck stiffness, no fever.  No current indication for LP.  Differential does include infectious as  above versus transient global amnesia versus possibly but less likely anxiety related. ?- Appreciate neurology recommendations and they evaluated and signed off the case given the MRI is reassuring EEG was normal ?- Follow-up urinalysis, urine culture, chest x-ray, blood cultures ?-Urine culture was unremarkable and showed no growth and urinalysis was within normal limits and only showed rare bacteria.  Chest x-ray showed no active disease ?- Checked TSH and was 0.772 ?- Trend fever curve and white blood cell count and WBC has trended downwards ? ?Viral Gastroenteritis  ?-Nausea vomiting has improved but she is starting have some slight diarrhea ?-Continue with symptomatic care and supportive care and management and was given additional 1 L bolus prior to discharge ?  ?Hypertension ?- Continue home hydrochlorothiazide and resume in the next few days ? ?Anxiety ?- Holding home duloxetine in setting of altered mental status as above and neurology recommends resuming at 30 mg twice daily ? ?Abnormal LFTs ?-Patient is AST went from 40 and trended up to 52 and ALT went from 51 is now 58 ?-Was mild in the setting of her viral gastroenteritis nausea vomiting ?-CK was 35 and GGT was 19 and ammonia level 15 ?-Follow-up with PCP and gastroenterology if necessary repeat CMP within 1 week and obtain right upper quadrant ultrasound acute hepatitis panel if continues to be elevated on repeat labs ? ?Hypokalemia ?-The setting of mild diarrhea; replete prior to discharge ?-Continue monitor and trend and repeat CMP within a.m. ? ?Consultants: Neurology ?Procedures performed: MRI, EEG  ?Disposition: Home ?Diet recommendation:  ?Discharge Diet Orders (From admission, onward)  ? ?  Start     Ordered  ? 12/12/21 0000  Diet - low sodium heart healthy       ? 12/12/21 1314  ? ?  ?  ? ?  ? ?Regular diet ?DISCHARGE MEDICATION: ?Allergies as of 12/12/2021   ? ?   Reactions  ? Celecoxib Hives, Nausea And Vomiting  ? rash, migraine  ? Codeine  Nausea And Vomiting  ? rash, Syncope, Migraine HA  ? Morphine Hives, Itching  ? Propoxyphene N-acetaminophen Hives, Itching  ? Tramadol Hcl Hives, Itching  ? Pneumococcal Vaccines Rash  ? Local reaction on arm. Redness swelling and rash  ? Sulfa Antibiotics Rash  ? ?  ? ?  ?Medication List  ?  ? ?STOP taking these medications   ? ?acetaminophen 500 MG tablet ?Commonly known as: TYLENOL ?  ?Naproxen Sodium 220 MG Caps ?  ? ?  ? ?TAKE these medications   ? ?baclofen 10 MG tablet ?Commonly known as: LIORESAL ?Take 10 mg by mouth at bedtime as needed for muscle spasms. ?  ?BENADRYL ALLERGY PO ?Take 1 tablet by mouth at bedtime. ?  ?CALCIUM-D PO ?Take 1 tablet by mouth daily. ?  ?DULoxetine 30 MG capsule ?Commonly known as: CYMBALTA ?Take 1 capsule (30 mg total) by mouth 2 (two) times daily. Take 2 capsules (60 mg) by mouth every morning and take 1 capsule (30 mg) every evening ?What changed:  ?how much to take ?when to take this ?  ?estradiol 0.0375 MG/24HR ?Commonly known as: VIVELLE-DOT ?Place 1 patch onto the skin 2 (  two) times a week. Tuesday and Friday ?  ?hydrochlorothiazide 25 MG tablet ?Commonly known as: HYDRODIURIL ?Take 1 tablet (25 mg total) by mouth daily. ?Start taking on: December 15, 2021 ?What changed: These instructions start on December 15, 2021. If you are unsure what to do until then, ask your doctor or other care provider. ?  ?Imvexxy Maintenance Pack 4 MCG Inst ?Generic drug: Estradiol ?Place 4 mcg vaginally 2 (two) times a week. Monday and Thursday ?What changed: Another medication with the same name was removed. Continue taking this medication, and follow the directions you see here. ?  ?latanoprost 0.005 % ophthalmic solution ?Commonly known as: XALATAN ?Place 1 drop into both eyes at bedtime. ?  ?multivitamin tablet ?Take 1 tablet by mouth daily. ?  ?ondansetron 4 MG tablet ?Commonly known as: ZOFRAN ?Take 1 tablet (4 mg total) by mouth every 6 (six) hours as needed for nausea. ?  ?timolol 0.5 %  ophthalmic solution ?Commonly known as: TIMOPTIC ?1 drop 2 (two) times daily. ?  ? ?  ? ? ?Discharge Exam: ?Filed Weights  ? 12/11/21 1500  ?Weight: 62.8 kg  ? ?Vitals:  ? 12/12/21 1205 12/12/21 1347  ?BP:

## 2021-12-12 NOTE — ED Notes (Signed)
Requested phlebotomy to collect blood cultures x2.  ?

## 2021-12-12 NOTE — ED Notes (Signed)
Attempted to collect blood cultures. Successful collection of blue culture bottle. Unsuccessful to collect other blood culture bottles. Requested phlebotomy to collect cultures.   ?

## 2021-12-12 NOTE — Evaluation (Signed)
Physical Therapy Evaluation and Discharge ?Patient Details ?Name: Grace [name] ?MRN: [id number] ?DOB: 1961/01/19 ?Today's Date: 12/12/2021 ? ?History of Present Illness ? Pt is a 61 y.o. F who presents 12/11/2021 with AMS; noted to be confused and forgetting previous events. MRI negative for acute abnormality. Significant PMH: headaches, anxiety, chronic pain.  ?Clinical Impression ? Patient evaluated by Physical Therapy with no further acute PT needs identified. PTA, pt lives with her spouse and is independent. States she walks 3 miles/day. Pt reports feeling weak due to lack of eating, but otherwise back to baseline. Pt ambulating 100 ft with no assistive device independently. Scoring 24/24 on the Dynamic Gait Index, indicating she is not at risk for falls. Pt with 1/2 short term memory recall, 2/3 with cues. All education has been completed and the patient has no further questions. No follow-up Physical Therapy or equipment needs. PT is signing off. Thank you for this referral. ? ?   ? ?Recommendations for follow up therapy are one component of a multi-disciplinary discharge planning process, led by the attending physician.  Recommendations may be updated based on patient status, additional functional criteria and insurance authorization. ? ?Follow Up Recommendations No PT follow up ? ?  ?Assistance Recommended at Discharge None  ?Patient can return home with the following ?   ? ?  ?Equipment Recommendations None recommended by PT  ?Recommendations for Other Services ?    ?  ?Functional Status Assessment Patient has not had a recent decline in their functional status  ? ?  ?Precautions / Restrictions Precautions ?Precautions: None ?Restrictions ?Weight Bearing Restrictions: No  ? ?  ? ?Mobility ? Bed Mobility ?Overal bed mobility: Independent ?  ?  ?  ?  ?  ?  ?  ?  ? ?Transfers ?Overall transfer level: Independent ?Equipment used: None ?  ?  ?  ?  ?  ?  ?  ?  ?  ? ?Ambulation/Gait ?Ambulation/Gait assistance:  Independent ?Gait Distance (Feet): 100 Feet ?Assistive device: None ?Gait Pattern/deviations: WFL(Within Functional Limits) ?  ?  ?  ?  ? ?Stairs ?  ?  ?  ?  ?  ? ?Wheelchair Mobility ?  ? ?Modified Rankin (Stroke Patients Only) ?Modified Rankin (Stroke Patients Only) ?Pre-Morbid Rankin Score: No symptoms ?Modified Rankin: No symptoms ? ?  ? ?Balance Overall balance assessment: No apparent balance deficits (not formally assessed) ?  ?  ?  ?  ?  ?  ?  ?  ?  ?  ?  ?  ?  ?  ?  ?Standardized Balance Assessment ?Standardized Balance Assessment : Dynamic Gait Index ?  ?Dynamic Gait Index ?Level Surface: Normal ?Change in Gait Speed: Normal ?Gait with Horizontal Head Turns: Normal ?Gait with Vertical Head Turns: Normal ?Gait and Pivot Turn: Normal ?Step Over Obstacle: Normal ?Step Around Obstacles: Normal ?Steps: Normal ?Total Score: 24 ?   ? ? ? ?Pertinent Vitals/Pain Pain Assessment ?Pain Assessment: No/denies pain  ? ? ?Home Living Family/patient expects to be discharged to:: Private residence ?Living Arrangements: Spouse/significant other ?Available Help at Discharge: Family ?Type of Home: House ?Home Access: Stairs to enter ?  ?Entrance Stairs-Number of Steps:  ("a couple") ?  ?Home Layout: One level ?Home Equipment: None ?   ?  ?Prior Function Prior Level of Function : Independent/Modified Independent ?  ?  ?  ?  ?  ?  ?Mobility Comments: walks 3 miles a day ?  ?  ? ? ?Hand Dominance  ? Dominant  Hand: Right ? ?  ?Extremity/Trunk Assessment  ? Upper Extremity Assessment ?Upper Extremity Assessment: RUE deficits/detail;LUE deficits/detail ?RUE Deficits / Details: Strength 5/5 ?LUE Deficits / Details: Strength 5/5 ?  ? ?Lower Extremity Assessment ?Lower Extremity Assessment: RLE deficits/detail;LLE deficits/detail ?RLE Deficits / Details: Strength 5/5 ?LLE Deficits / Details: Strength 5/5 ?  ? ?Cervical / Trunk Assessment ?Cervical / Trunk Assessment: Normal  ?Communication  ? Communication: No difficulties  ?Cognition  Arousal/Alertness: Awake/alert ?Behavior During Therapy: [facility] for tasks assessed/performed ?Overall Cognitive Status: Impaired/Different from baseline ?Area of Impairment: Memory ?  ?  ?  ?  ?  ?  ?  ?  ?  ?  ?Memory: Decreased short-term memory ?  ?  ?  ?  ?General Comments: 1/3 short term memory recall, 2/3 with cueing ?  ?  ? ?  ?General Comments   ? ?  ?Exercises    ? ?Assessment/Plan  ?  ?PT Assessment Patient does not need any further PT services  ?PT Problem List   ? ?   ?  ?PT Treatment Interventions     ? ?PT Goals (Current goals can be found in the Care Plan section)  ?Acute Rehab PT Goals ?Patient Stated Goal: go home ?PT Goal Formulation: All assessment and education complete, DC therapy ? ?  ?Frequency   ?  ? ? ?Co-evaluation   ?  ?  ?  ?  ? ? ?  ?AM-PAC PT "6 Clicks" Mobility  ?Outcome Measure Help needed turning from your back to your side while in a flat bed without using bedrails?: None ?Help needed moving from lying on your back to sitting on the side of a flat bed without using bedrails?: None ?Help needed moving to and from a bed to a chair (including a wheelchair)?: None ?Help needed standing up from a chair using your arms (e.g., wheelchair or bedside chair)?: None ?Help needed to walk in hospital room?: None ?Help needed climbing 3-5 steps with a railing? : None ?6 Click Score: 24 ? ?  ?End of Session   ?Activity Tolerance: Patient tolerated treatment well ?Patient left: in bed;with call bell/phone within reach ?Nurse Communication: Mobility status ?PT Visit Diagnosis: Other symptoms and signs involving the nervous system (R29.898) ?  ? ?Time: [id number] ?PT Time Calculation (min) (ACUTE ONLY): 15 min ? ? ?Charges:   PT Evaluation ?$PT Eval Low Complexity: 1 Low ?  ?  ?   ? ? ?Wyona Almas, PT, DPT ?Acute Rehabilitation Services ?Pager [phone] ?Office [phone] ? ? ?[name] ?12/12/2021, 12:55 PM ? ?

## 2021-12-12 NOTE — Procedures (Signed)
Patient Name: Grace [name]  ?MRN: [id number]  ?Epilepsy Attending: Charlsie Quest  ?Referring Physician/Provider: Gordy Councilman, MD ?Date: 12/11/2021 ?Duration: 24.51 mins ? ?Patient history: 61 year old woman presenting with acute onset confusion in the setting of nausea/vomiting. EEG to evaluate for seizure ? ?Level of alertness: Awake ? ?AEDs during EEG study: None ? ?Technical aspects: This EEG study was done with scalp electrodes positioned according to the 10-20 International system of electrode placement. Electrical activity was acquired at a sampling rate of 500Hz  and reviewed with a high frequency filter of 70Hz  and a low frequency filter of 1Hz . EEG data were recorded continuously and digitally stored.  ? ?Description: The posterior dominant rhythm consists of 10 Hz activity of moderate voltage (25-35 uV) seen predominantly in posterior head regions, symmetric and reactive to eye opening and eye closing. Hyperventilation and photic stimulation were not performed.    ? ?IMPRESSION: ?This study is within normal limits. No seizures or epileptiform discharges were seen throughout the recording. ? ?  ? ?

## 2021-12-12 NOTE — ED Notes (Signed)
Breakfast order placed ?

## 2021-12-12 NOTE — Progress Notes (Signed)
Neurology Progress Note ? ?Patient ID: Grace [name] is a 61 y.o. with PMHx of hypertension, hyperlipidemia, chronic pain on duloxetine, anxiety,  presenting with acute onset confusion in the setting of nausea/vomiting. ? ? ?Subjective: ?- Reports some loose stools, otherwise feeling hungry and mentation much improved, back to baseline per husband, slightly feeling globally weak ? ?Exam: ?Current vital signs: ?BP 126/64   Pulse 82   Temp 98.1 ?F (36.7 ?C) (Oral)   Resp 17   Wt 62.8 kg   LMP 08/20/1988   SpO2 100%   BMI 23.76 kg/m?  ?Vital signs in last 24 hours: ?Temp:  [98.1 ?F (36.7 ?C)] 98.1 ?F (36.7 ?C) (04/24 2024) ?Pulse Rate:  [79-89] 82 (04/25 0600) ?Resp:  [15-23] 17 (04/25 0600) ?BP: (118-144)/(64-78) 126/64 (04/25 0600) ?SpO2:  [98 %-100 %] 100 % (04/25 0600) ?Weight:  [62.8 kg] 62.8 kg (04/24 1500) ? ? ?Gen: In bed, comfortable  ?Resp: non-labored breathing, no grossly audible wheezing ?Cardiac: Perfusing extremities well  ?Abd: soft, nt ? ?Neuro: ?MS: Awake, alert, oriented to person place time and situation, able to provide much more history about events over the weekend and yesterday although she has some gaps in her memory stool while she was acutely ill (cannot remember ambulance ride for example or calling her husband) ?CN: Tracking examiner in all quadrants, no ptosis, face symmetric, tongue midline ?Motor: No pronator drift, using all extremities freely and equally ?Sensory: Intact to light touch throughout ? ?Pertinent Labs: ?Hypokalemic to 3.0 this morning, hypocalcemic to 7.6, mild transaminitis AST/ALT in the 50s, albumin low at 3 ?Leukocytosis has resolved from 14.7 to 9.4 ?UA with small hemoglobin pigment and 20 ketones, rare bacteria, no RBCs ?UDS negative ?No results found for: VITAMINB12  ? ?MRI brain personally reviewed, agree with radiology, no acute intracranial process ?EEG: Formal read pending ? ?Impression: This is a 61 year old woman with past medical history as above,  presenting with confusional state, now much improved.  Suspect this is related to GI bug, nausea/vomiting and anxiety.  MRI brain reassuring.  EEG read pending, though I have low concern for seizure overall. Note, duloxetine can be associated with liver injury.  Discussed with patient resuming this medication at a reduced dose with close outpatient follow-up for liver function monitoring and dose titration as needed ? ?Recommendations: ?-Additional nutritional labs: B12, thiamine, ammonia, ordered, can be followed up by PCP ?-CK, GGT to differentiate liver versus muscle tissue injury ?-Hold duloxetine this morning, resume this evening at 30 mg twice daily.  Patient to discuss with PCP repeating liver function tests prior to increasing dose ?-Neurology will follow-up EEG read, but otherwise will be available on an as-needed basis going forward.  Please reach out if any questions or concerns arise ? ?Brooke Dare MD-PhD ?[facility] ?[phone]  ?Available 7 AM to 7 PM, outside these hours please contact Neurologist on call listed on AMION  ?

## 2021-12-13 LAB — VITAMIN B1: Vitamin B1 (Thiamine): 118.3 nmol/L (ref 66.5–200.0)

## 2021-12-14 LAB — METHYLMALONIC ACID, SERUM: Methylmalonic Acid, Quantitative: 84 nmol/L (ref 0–378)

## 2021-12-15 ENCOUNTER — Encounter: Payer: Self-pay | Admitting: Family Medicine

## 2021-12-15 ENCOUNTER — Ambulatory Visit: Payer: BC Managed Care – PPO | Admitting: Family Medicine

## 2021-12-15 VITALS — BP 139/78 | HR 75 | Temp 97.6°F | Resp 18 | Ht 64.0 in | Wt 140.0 lb

## 2021-12-15 DIAGNOSIS — F411 Generalized anxiety disorder: Secondary | ICD-10-CM | POA: Diagnosis not present

## 2021-12-15 DIAGNOSIS — R7401 Elevation of levels of liver transaminase levels: Secondary | ICD-10-CM

## 2021-12-15 DIAGNOSIS — G9340 Encephalopathy, unspecified: Secondary | ICD-10-CM | POA: Diagnosis not present

## 2021-12-15 DIAGNOSIS — R7402 Elevation of levels of lactic acid dehydrogenase (LDH): Secondary | ICD-10-CM

## 2021-12-15 NOTE — Progress Notes (Signed)
? ?Subjective:  ? ? Patient ID: Grace [name], female    DOB: 1960-11-29, 61 y.o.   MRN: [id number] ? ?HPI ?Pt presents for f/u of hosp from 4/24 to 12/12/21 for acute encephalopathy  ? ?Wt Readings from Last 3 Encounters:  ?12/15/21 140 lb (63.5 kg)  ?12/11/21 138 lb 7.2 oz (62.8 kg)  ?11/28/21 137 lb 8 oz (62.4 kg)  ? ?24.03 kg/m? ? ?Was tx for sinusitis with zpack on 4/11  ?Doubt related  ? ?ER visit 424 for acute confusion -held for observatoin  ?She arrived via EMS with code stroke ?Hx of nausea that day , became confused /disoriented as the day went on  ?Vomiting and diarrhea at the time  ? ?Mentation improved  ?Admitted for observation  ? ?Urine and blood cultures negative  ? ?Results for orders placed or performed during the hospital encounter of 12/11/21  ?Urine Culture  ? Specimen: In/Out Cath Urine  ?Result Value Ref Range  ? Specimen Description IN/OUT CATH URINE   ? Special Requests NONE   ? Culture    ?  NO GROWTH ?Performed at [facility], [place] [address], [place], [place] [address] ?  ? Report Status 12/12/2021 FINAL   ?Culture, blood (Routine X 2) w Reflex to ID Panel  ? Specimen: BLOOD RIGHT HAND  ?Result Value Ref Range  ? Specimen Description BLOOD RIGHT HAND   ? Special Requests    ?  BOTTLES DRAWN AEROBIC ONLY Blood Culture results may not be optimal due to an inadequate volume of blood received in culture bottles  ? Culture    ?  NO GROWTH 4 DAYS ?Performed at [facility], [place] [address] [place], [place] [address] ?  ? Report Status PENDING   ?Culture, blood (Routine X 2) w Reflex to ID Panel  ? Specimen: BLOOD  ?Result Value Ref Range  ? Specimen Description BLOOD SITE NOT SPECIFIED   ? Special Requests    ?  BOTTLES DRAWN AEROBIC AND ANAEROBIC Blood Culture adequate volume  ? Culture    ?  NO GROWTH 3 DAYS ?Performed at [facility], [place] [address], [place], [place] [address] ?  ? Report Status PENDING   ?Protime-INR  ?Result Value Ref Range  ? Prothrombin Time 12.0 11.4 -  15.2 seconds  ? INR 0.9 0.8 - 1.2  ?APTT  ?Result Value Ref Range  ? aPTT 26 24 - 36 seconds  ?CBC  ?Result Value Ref Range  ? WBC 14.7 (H) 4.0 - 10.5 K/uL  ? RBC 4.79 3.87 - 5.11 MIL/uL  ? Hemoglobin 15.2 (H) 12.0 - 15.0 g/dL  ? HCT 45.2 36.0 - 46.0 %  ? MCV 94.4 80.0 - 100.0 fL  ? MCH 31.7 26.0 - 34.0 pg  ? MCHC 33.6 30.0 - 36.0 g/dL  ? RDW 13.2 11.5 - 15.5 %  ? Platelets 247 150 - 400 K/uL  ? nRBC 0.0 0.0 - 0.2 %  ?Differential  ?Result Value Ref Range  ? Neutrophils Relative % 94 %  ? Neutro Abs 13.9 (H) 1.7 - 7.7 K/uL  ? Lymphocytes Relative 1 %  ? Lymphs Abs 0.2 (L) 0.7 - 4.0 K/uL  ? Monocytes Relative 3 %  ? Monocytes Absolute 0.4 0.1 - 1.0 K/uL  ? Eosinophils Relative 1 %  ? Eosinophils Absolute 0.1 0.0 - 0.5 K/uL  ? Basophils Relative 0 %  ? Basophils Absolute 0.0 0.0 - 0.1 K/uL  ? Immature Granulocytes  1 %  ? Abs Immature Granulocytes 0.07 0.00 - 0.07 K/uL  ?Comprehensive metabolic panel  ?Result Value Ref Range  ? Sodium 139 135 - 145 mmol/L  ? Potassium 3.5 3.5 - 5.1 mmol/L  ? Chloride 103 98 - 111 mmol/L  ? CO2 27 22 - 32 mmol/L  ? Glucose, Bld 124 (H) 70 - 99 mg/dL  ? BUN 19 8 - 23 mg/dL  ? Creatinine, Ser 0.68 0.44 - 1.00 mg/dL  ? Calcium 9.0 8.9 - 10.3 mg/dL  ? Total Protein 6.7 6.5 - 8.1 g/dL  ? Albumin 4.0 3.5 - 5.0 g/dL  ? AST 48 (H) 15 - 41 U/L  ? ALT 51 (H) 0 - 44 U/L  ? Alkaline Phosphatase 42 38 - 126 U/L  ? Total Bilirubin 0.8 0.3 - 1.2 mg/dL  ? GFR, Estimated >60 >60 mL/min  ? Anion gap 9 5 - 15  ?Urinalysis, Routine w reflex microscopic Urine, Clean Catch  ?Result Value Ref Range  ? Color, Urine YELLOW YELLOW  ? APPearance HAZY (A) CLEAR  ? Specific Gravity, Urine 1.021 1.005 - 1.030  ? pH 5.0 5.0 - 8.0  ? Glucose, UA NEGATIVE NEGATIVE mg/dL  ? Hgb urine dipstick SMALL (A) NEGATIVE  ? Bilirubin Urine NEGATIVE NEGATIVE  ? Ketones, ur 20 (A) NEGATIVE mg/dL  ? Protein, ur NEGATIVE NEGATIVE mg/dL  ? Nitrite NEGATIVE NEGATIVE  ? Leukocytes,Ua NEGATIVE NEGATIVE  ? RBC / HPF 0-5 0 - 5 RBC/hpf  ?  WBC, UA 0-5 0 - 5 WBC/hpf  ? Bacteria, UA RARE (A) NONE SEEN  ? Squamous Epithelial / LPF 6-10 0 - 5  ? Mucus PRESENT   ?Urine rapid drug screen (hosp performed)  ?Result Value Ref Range  ? Opiates NONE DETECTED NONE DETECTED  ? Cocaine NONE DETECTED NONE DETECTED  ? Benzodiazepines NONE DETECTED NONE DETECTED  ? Amphetamines NONE DETECTED NONE DETECTED  ? Tetrahydrocannabinol NONE DETECTED NONE DETECTED  ? Barbiturates NONE DETECTED NONE DETECTED  ?Magnesium  ?Result Value Ref Range  ? Magnesium 1.9 1.7 - 2.4 mg/dL  ?HIV Antibody (routine testing w rflx)  ?Result Value Ref Range  ? HIV Screen 4th Generation wRfx Non Reactive Non Reactive  ?TSH  ?Result Value Ref Range  ? TSH 0.772 0.350 - 4.500 uIU/mL  ?Comprehensive metabolic panel  ?Result Value Ref Range  ? Sodium 137 135 - 145 mmol/L  ? Potassium 3.0 (L) 3.5 - 5.1 mmol/L  ? Chloride 103 98 - 111 mmol/L  ? CO2 28 22 - 32 mmol/L  ? Glucose, Bld 109 (H) 70 - 99 mg/dL  ? BUN 17 8 - 23 mg/dL  ? Creatinine, Ser 0.69 0.44 - 1.00 mg/dL  ? Calcium 7.6 (L) 8.9 - 10.3 mg/dL  ? Total Protein 5.4 (L) 6.5 - 8.1 g/dL  ? Albumin 3.0 (L) 3.5 - 5.0 g/dL  ? AST 52 (H) 15 - 41 U/L  ? ALT 58 (H) 0 - 44 U/L  ? Alkaline Phosphatase 38 38 - 126 U/L  ? Total Bilirubin 0.6 0.3 - 1.2 mg/dL  ? GFR, Estimated >60 >60 mL/min  ? Anion gap 6 5 - 15  ?CBC  ?Result Value Ref Range  ? WBC 9.4 4.0 - 10.5 K/uL  ? RBC 3.96 3.87 - 5.11 MIL/uL  ? Hemoglobin 12.5 12.0 - 15.0 g/dL  ? HCT 37.4 36.0 - 46.0 %  ? MCV 94.4 80.0 - 100.0 fL  ? MCH 31.6 26.0 - 34.0 pg  ? MCHC  33.4 30.0 - 36.0 g/dL  ? RDW 13.5 11.5 - 15.5 %  ? Platelets 224 150 - 400 K/uL  ? nRBC 0.0 0.0 - 0.2 %  ?Ammonia  ?Result Value Ref Range  ? Ammonia 15 9 - 35 umol/L  ?Vitamin B12  ?Result Value Ref Range  ? Vitamin B-12 1,001 (H) 180 - 914 pg/mL  ?Vitamin B1  ?Result Value Ref Range  ? Vitamin B1 (Thiamine) 118.3 66.5 - 200.0 nmol/L  ?Methylmalonic acid, serum  ?Result Value Ref Range  ? Methylmalonic Acid, Quantitative 84 0 - 378  nmol/L  ?CK  ?Result Value Ref Range  ? Total CK 35 (L) 38 - 234 U/L  ?Gamma GT  ?Result Value Ref Range  ? GGT 19 7 - 50 U/L  ?I-stat chem 8, ED  ?Result Value Ref Range  ? Sodium 139 135 - 145 mmol/L  ? Potassium 3.5 3.5 - 5.1 mmol/L  ? Chloride 101 98 - 111 mmol/L  ? BUN 23 8 - 23 mg/dL  ? Creatinine, Ser 0.60 0.44 - 1.00 mg/dL  ? Glucose, Bld 117 (H) 70 - 99 mg/dL  ? Calcium, Ion 1.11 (L) 1.15 - 1.40 mmol/L  ? TCO2 30 22 - 32 mmol/L  ? Hemoglobin 16.3 (H) 12.0 - 15.0 g/dL  ? HCT 48.0 (H) 36.0 - 46.0 %  ?CBG monitoring, ED  ?Result Value Ref Range  ? Glucose-Capillary 126 (H) 70 - 99 mg/dL  ?  ?No changes on EKG ?AST and ALT elevated  ?Wbc and Hb elevated ?K was 3.0  ?Ionized ca 1.11 ?Albumin 3.0 ? ?Clear cxr  ?MRI brain -no intercranial pathology  ?EEG : normal  ? ?Thought her encephalopathy may be due to gastroenteritis/dehydration  ?Tx with fluids/supp care  ?Adv to re start hctz in a few days  ?Adv to re start duloxetine  ? ?Abnormal LFTs ?-Patient is AST went from 40 and trended up to 52 and ALT went from 51 is now 54 ?-Was mild in the setting of her viral gastroenteritis nausea vomiting ?-CK was 35 and GGT was 19 and ammonia level 15 ?-Follow-up with PCP and gastroenterology if necessary repeat CMP within 1 week and obtain right upper quadrant ultrasound acute hepatitis panel if continues to be elevated on repeat labs ? ?Of note-had ccy in 2006 ?  ?Hypokalemia ?-The setting of mild diarrhea; replete prior to discharge ?-Continue monitor and trend and repeat CMP within a.m. ? ?Recomm at d/c ?Recommendations at discharge:  ?  ?Follow up with PCP within 1-2 weeks and repeat CBC, CMP, Mag, Phos within 1 week ?Follow up with Neurology in the outpatient setting and discuss with them about going back on Duloxetine 60 mg in the AM and 30 mg qHS ?Follow up with PCP/Gastroenterology in the outpatient setting for Monitoring of LFTs and further workup if they remain elevated ?Follow-up on infectious studies are still  pending including blood cultures x2, ? ? ?Now is better  ?Loose stools have improved  ?BRAT diet adv to some chicken and rice  ?Now bm is normal  ?No more n/v ? ?Tired- getting her energy back  ?Normal ment

## 2021-12-15 NOTE — Patient Instructions (Addendum)
Labs today  ?Keep drinking fluids  ?Gradually advance diet to regular  ? ?Labs today  ?Take it easy this weekend  ? ?If liver tests stay up we will check an abd ultrasound  ? ?If symptoms return or any changes let us know  ? ? ?

## 2021-12-16 LAB — CULTURE, BLOOD (ROUTINE X 2): Culture: NO GROWTH

## 2021-12-17 LAB — CULTURE, BLOOD (ROUTINE X 2)
Culture: NO GROWTH
Special Requests: ADEQUATE

## 2021-12-17 NOTE — Assessment & Plan Note (Signed)
In setting of recent gastroenteritis with dehydration and confusion  ?ccy years ago  ?Taking cymbalta/held briefly in hospital  ?Lab today ?

## 2021-12-17 NOTE — Assessment & Plan Note (Signed)
Noted in hospital during gastroenteritis with confusion  ?Clinically resolved ?Lab today ?

## 2021-12-17 NOTE — Assessment & Plan Note (Signed)
Hospitalized from 4/24 to 4/25  ?Reviewed hospital records, lab results and studies in detail  ?Negative w/u for stroke and sepsis  ?May have come from viral gastroenteritis/dehydraton  ?Elevated lfts and hypokalemia -f/u today  ?Nl exam/symptoms resolved ?

## 2021-12-17 NOTE — Assessment & Plan Note (Signed)
Held cymbalta during episode of encephalopathy  ?Also had elevated LFTs ? ?Re starting now  ?Lab today ?

## 2021-12-18 LAB — CBC WITH DIFFERENTIAL/PLATELET
Absolute Monocytes: 371 cells/uL (ref 200–950)
Basophils Absolute: 41 cells/uL (ref 0–200)
Basophils Relative: 0.7 %
Eosinophils Absolute: 244 cells/uL (ref 15–500)
Eosinophils Relative: 4.2 %
HCT: 38.7 % (ref 35.0–45.0)
Hemoglobin: 13.2 g/dL (ref 11.7–15.5)
Lymphs Abs: 1409 cells/uL (ref 850–3900)
MCH: 31.5 pg (ref 27.0–33.0)
MCHC: 34.1 g/dL (ref 32.0–36.0)
MCV: 92.4 fL (ref 80.0–100.0)
MPV: 9.1 fL (ref 7.5–12.5)
Monocytes Relative: 6.4 %
Neutro Abs: 3735 cells/uL (ref 1500–7800)
Neutrophils Relative %: 64.4 %
Platelets: 255 10*3/uL (ref 140–400)
RBC: 4.19 10*6/uL (ref 3.80–5.10)
RDW: 12.5 % (ref 11.0–15.0)
Total Lymphocyte: 24.3 %
WBC: 5.8 10*3/uL (ref 3.8–10.8)

## 2021-12-18 LAB — BASIC METABOLIC PANEL
BUN: 11 mg/dL (ref 7–25)
CO2: 31 mmol/L (ref 20–32)
Calcium: 9 mg/dL (ref 8.6–10.4)
Chloride: 103 mmol/L (ref 98–110)
Creat: 0.71 mg/dL (ref 0.50–1.05)
Glucose, Bld: 91 mg/dL (ref 65–99)
Potassium: 3.4 mmol/L — ABNORMAL LOW (ref 3.5–5.3)
Sodium: 142 mmol/L (ref 135–146)

## 2021-12-18 LAB — ACUTE HEP PANEL AND HEP B SURFACE AB
HEPATITIS C ANTIBODY REFILL$(REFL): NONREACTIVE
Hep A IgM: NONREACTIVE
Hep B C IgM: NONREACTIVE
Hepatitis B Surface Ag: NONREACTIVE
SIGNAL TO CUT-OFF: 0.16 (ref <1.00)

## 2021-12-18 LAB — HEPATIC FUNCTION PANEL
AG Ratio: 1.7 (calc) (ref 1.0–2.5)
ALT: 123 U/L — ABNORMAL HIGH (ref 6–29)
AST: 79 U/L — ABNORMAL HIGH (ref 10–35)
Albumin: 4 g/dL (ref 3.6–5.1)
Alkaline phosphatase (APISO): 50 U/L (ref 37–153)
Bilirubin, Direct: 0.1 mg/dL (ref 0.0–0.2)
Globulin: 2.3 g/dL (calc) (ref 1.9–3.7)
Indirect Bilirubin: 0.3 mg/dL (calc) (ref 0.2–1.2)
Total Bilirubin: 0.4 mg/dL (ref 0.2–1.2)
Total Protein: 6.3 g/dL (ref 6.1–8.1)

## 2021-12-18 LAB — CALCIUM, IONIZED: Calcium, Ion: 5.1 mg/dL (ref 4.7–5.5)

## 2021-12-18 LAB — PHOSPHORUS: Phosphorus: 3.1 mg/dL (ref 2.5–4.5)

## 2021-12-18 LAB — HEPATITIS C ANTIBODY
Hepatitis C Ab: NONREACTIVE
SIGNAL TO CUT-OFF: 0.16 (ref <1.00)

## 2021-12-18 LAB — REFLEX TIQ

## 2021-12-20 ENCOUNTER — Encounter: Payer: Self-pay | Admitting: Family Medicine

## 2021-12-20 ENCOUNTER — Telehealth: Payer: Self-pay | Admitting: Family Medicine

## 2021-12-20 DIAGNOSIS — R7401 Elevation of levels of liver transaminase levels: Secondary | ICD-10-CM

## 2021-12-20 NOTE — Telephone Encounter (Signed)
The order is in

## 2021-12-20 NOTE — Telephone Encounter (Signed)
Pt received a message from Dr Glori Bickers to see which location she wanted to go to [place] or [place] for ultrasound and she said [facility] is fine with her. I couldn't see what message pt was talking about to add this note to. Call back if needed for pt is [phone].  ?

## 2021-12-20 NOTE — Telephone Encounter (Signed)
Pt viewed lab results see response  ?

## 2022-01-15 ENCOUNTER — Telehealth: Payer: Self-pay | Admitting: Family Medicine

## 2022-01-15 DIAGNOSIS — R7402 Elevation of levels of lactic acid dehydrogenase (LDH): Secondary | ICD-10-CM

## 2022-01-15 NOTE — Telephone Encounter (Signed)
-----   Message from Alvina Chou sent at 01/11/2022  2:24 PM EDT ----- Regarding: Lab orders for Tuesday, 5.30.23 Lab orders, thanks

## 2022-01-16 ENCOUNTER — Other Ambulatory Visit (INDEPENDENT_AMBULATORY_CARE_PROVIDER_SITE_OTHER): Payer: BC Managed Care – PPO

## 2022-01-16 DIAGNOSIS — R7401 Elevation of levels of liver transaminase levels: Secondary | ICD-10-CM

## 2022-01-16 DIAGNOSIS — R7402 Elevation of levels of lactic acid dehydrogenase (LDH): Secondary | ICD-10-CM | POA: Diagnosis not present

## 2022-01-16 LAB — HEPATIC FUNCTION PANEL
ALT: 19 U/L (ref 0–35)
AST: 23 U/L (ref 0–37)
Albumin: 4.3 g/dL (ref 3.5–5.2)
Alkaline Phosphatase: 45 U/L (ref 39–117)
Bilirubin, Direct: 0.1 mg/dL (ref 0.0–0.3)
Total Bilirubin: 0.4 mg/dL (ref 0.2–1.2)
Total Protein: 6.3 g/dL (ref 6.0–8.3)

## 2022-01-17 ENCOUNTER — Telehealth: Payer: Self-pay | Admitting: *Deleted

## 2022-01-17 NOTE — Telephone Encounter (Signed)
Left VM requesting pt to call the office back 

## 2022-01-17 NOTE — Telephone Encounter (Signed)
-----   Message from Judy Pimple, MD sent at 01/16/2022  7:08 PM EDT ----- Your liver tests are entirely normal now  How are you feeling ?

## 2022-01-17 NOTE — Telephone Encounter (Signed)
Addressed through result notes  

## 2022-11-08 LAB — HM MAMMOGRAPHY

## 2022-11-08 LAB — HM DEXA SCAN: HM Dexa Scan: NORMAL

## 2023-04-09 ENCOUNTER — Ambulatory Visit: Payer: BC Managed Care – PPO | Admitting: Family Medicine

## 2023-04-09 ENCOUNTER — Encounter: Payer: Self-pay | Admitting: Family Medicine

## 2023-04-09 VITALS — BP 128/78 | HR 83 | Temp 98.0°F | Ht 64.0 in | Wt 154.1 lb

## 2023-04-09 DIAGNOSIS — H93A9 Pulsatile tinnitus, unspecified ear: Secondary | ICD-10-CM | POA: Diagnosis not present

## 2023-04-09 DIAGNOSIS — L309 Dermatitis, unspecified: Secondary | ICD-10-CM | POA: Insufficient documentation

## 2023-04-09 NOTE — Progress Notes (Signed)
Subjective:    Patient ID: Grace [name], female    DOB: 10-09-1960, 62 y.o.   MRN: [id number]  HPI  Wt Readings from Last 3 Encounters:  04/09/23 154 lb 2 oz (69.9 kg)  12/15/21 140 lb (63.5 kg)  12/11/21 138 lb 7.2 oz (62.8 kg)   26.46 kg/m  Vitals:   04/09/23 1225  BP: 128/78  Pulse: 83  Temp: 98 F (36.7 C)  SpO2: 98%    Pt presents with right ear symptoms  Also an area of concern on right breast    2 weeks ago- heard a thumping sound in right ear  Family member/doctor checked ear and removed some wax  Got better and then a little worse   Hears a thump thump  Can feel pulse in her neck Had a little pain when she went through the [employer] wakes up with a headache   Does not notice a hearing change   No nasal congestion  Little bit of drainage from allergies  No fever     Spot on right nipple  Has pink to orange color then a little white scab that came off Is a little sore to the touch No nipple discharge   Last mammogram ? January at gyn    No dizziness  Lab Results  Component Value Date   CHOL 222 (H) 12/26/2020   HDL 72.10 12/26/2020   LDLCALC 135 (H) 12/26/2020   TRIG 74.0 12/26/2020   CHOLHDL 3 12/26/2020   Has had cholesterol labs at endo and gyn in past     Patient Active Problem List   Diagnosis Date Noted   Nipple dermatitis 04/09/2023   Pulsatile tinnitus 04/09/2023   Low calcium levels 12/15/2021   Acute encephalopathy 12/11/2021   GAD (generalized anxiety disorder) 02/17/2021   Chronic pain 02/17/2021   Abnormal thyroid blood test 11/14/2020   Abnormal imaging of thyroid 10/31/2020   DDD (degenerative disc disease), cervical 03/06/2018   NONSPEC ELEVATION OF LEVELS OF TRANSAMINASE/LDH 08/25/2010   MENOPAUSAL SYNDROME 07/12/2009   RHINITIS 09/16/2008   Past Medical History:  Diagnosis Date   Allergic rhinitis, cause unspecified    Back pain    epidural injections   Foot fracture    Headache(784.0)     Nonspecific elevation of levels of transaminase or lactic acid dehydrogenase (LDH)    Pain in joint, site unspecified    Symptomatic menopausal or female climacteric states    Unspecified gastritis and gastroduodenitis without mention of hemorrhage    Unspecified sinusitis (chronic)    Past Surgical History:  Procedure Laterality Date   CCY  2006   COLONOSCOPY     ESOPHAGOGASTRODUODENOSCOPY  2006   gastritis   FOOT FRACTURE SURGERY  2010   Social History   Tobacco Use   Smoking status: Never   Smokeless tobacco: Never  Substance Use Topics   Alcohol use: No   Drug use: No   Family History  Problem Relation Age of Onset   Other Father        disk disease   Arthritis Unknown        paternal side   Other Brother        disk disease   Allergies  Allergen Reactions   Celecoxib Hives and Nausea And Vomiting    rash, migraine   Codeine Nausea And Vomiting    rash, Syncope, Migraine HA    Morphine Hives and Itching   Propoxyphene N-Acetaminophen Hives and Itching  Tramadol Hcl Hives and Itching   Metronidazole Rash   Pneumococcal Vaccines Rash    Local reaction on arm. Redness swelling and rash   Sulfa Antibiotics Rash   Current Outpatient Medications on File Prior to Visit  Medication Sig Dispense Refill   baclofen (LIORESAL) 10 MG tablet Take 10 mg by mouth at bedtime as needed for muscle spasms.     Calcium Carbonate-Vitamin D (CALCIUM-D PO) Take 1 tablet by mouth daily.     DULoxetine (CYMBALTA) 30 MG capsule Take 1 capsule (30 mg total) by mouth 2 (two) times daily. Take 2 capsules (60 mg) by mouth every morning and take 1 capsule (30 mg) every evening     estradiol (VIVELLE-DOT) 0.0375 MG/24HR Place 1 patch onto the skin 2 (two) times a week. Tuesday and Friday     hydrochlorothiazide (HYDRODIURIL) 25 MG tablet Take 1 tablet (25 mg total) by mouth daily.     IMVEXXY MAINTENANCE PACK 4 MCG INST Place 4 mcg vaginally 2 (two) times a week. Monday and Thursday      Multiple Vitamin (MULTIVITAMIN) tablet Take 1 tablet by mouth daily.     timolol (TIMOPTIC) 0.5 % ophthalmic solution 1 drop 2 (two) times daily.     VYZULTA 0.024 % SOLN Apply 1 drop to eye daily.     No current facility-administered medications on file prior to visit.    Review of Systems  Constitutional:  Negative for activity change, appetite change, fatigue, fever and unexpected weight change.  HENT:  Positive for tinnitus. Negative for congestion, ear pain, hearing loss, rhinorrhea, sinus pressure and sore throat.        Thumping sound in right ear   Hearing is improved after cerumen removal  Occational grits teeth at night   Eyes:  Negative for pain, redness and visual disturbance.  Respiratory:  Negative for cough, shortness of breath and wheezing.   Cardiovascular:  Negative for chest pain and palpitations.  Gastrointestinal:  Negative for abdominal pain, blood in stool, constipation and diarrhea.  Endocrine: Negative for polydipsia and polyuria.  Genitourinary:  Negative for dysuria, frequency and urgency.  Musculoskeletal:  Negative for arthralgias, back pain and myalgias.  Skin:  Negative for pallor and rash.  Allergic/Immunologic: Negative for environmental allergies.  Neurological:  Positive for headaches. Negative for dizziness and syncope.       Occational am headaches     Hematological:  Negative for adenopathy. Does not bruise/bleed easily.  Psychiatric/Behavioral:  Negative for decreased concentration and dysphoric mood. The patient is not nervous/anxious.        Objective:   Physical Exam Constitutional:      General: She is not in acute distress.    Appearance: Normal appearance. She is normal weight. She is not ill-appearing or diaphoretic.  HENT:     Head: Normocephalic and atraumatic.     Comments: No TMJ tenderness on either side     Right Ear: Tympanic membrane, ear canal and external ear normal.     Left Ear: Tympanic membrane, ear canal and  external ear normal.     Nose: Nose normal.     Comments: Boggy nares     Mouth/Throat:     Mouth: Mucous membranes are moist.     Pharynx: No oropharyngeal exudate or posterior oropharyngeal erythema.  Eyes:     General: No scleral icterus.       Right eye: No discharge.        Left eye: No discharge.  Extraocular Movements: Extraocular movements intact.     Pupils: Pupils are equal, round, and reactive to light.  Neck:     Vascular: No carotid bruit.  Cardiovascular:     Rate and Rhythm: Normal rate and regular rhythm.     Heart sounds: Normal heart sounds.  Pulmonary:     Effort: Pulmonary effort is normal. No respiratory distress.     Breath sounds: Normal breath sounds. No stridor. No wheezing, rhonchi or rales.  Genitourinary:    Comments: Breast exam: No mass, nodules, thickening, tenderness, bulging, retraction, inflamation, nipple discharge or skin changes noted.  No axillary or clavicular LA.     Dense breasts noted for age   Skin at tip of both nipples is dry with scale and slight cracking No signs of nipple discharge  Musculoskeletal:     Cervical back: Neck supple. No tenderness.  Lymphadenopathy:     Cervical: No cervical adenopathy.  Skin:    General: Skin is warm and dry.     Findings: No erythema.  Neurological:     Mental Status: She is alert.  Psychiatric:        Mood and Affect: Mood normal.           Assessment & Plan:   Problem List Items Addressed This Visit       Nervous and Auditory   Pulsatile tinnitus - Primary    Right side  Worse with recent pressure change  Normal exam  Hearing is grossly intact   Will refer for carotid doppler (bilateral) Of note no bruits heard  Will send for last lipid profile from gyn   Instructed to update in meantime if symptoms change or worsen      Relevant Orders   US Carotid Bilateral     Musculoskeletal and Integument   Nipple dermatitis    Pt has had some dry skin on distal nipple (worse  on right) -noted on both sides today  No nipple d/c or mass Has dense breast tissue   Recommend protection from friction and moisture  Trial of aquaphor prn  Update if not starting to improve in a week or if worsening -would consider imaging/ gyn follow up

## 2023-04-09 NOTE — Patient Instructions (Signed)
I want to get a carotid ultrasound  I put the referral in  Please let us know if you don't hear in 1-2 weeks   If you develop ear pain or new symptoms let us know   Try using some aquaphor on both nipples  If not improving in 1-2 weeks let us know and touch base with your gyn provider    Take care of yourself

## 2023-04-09 NOTE — Addendum Note (Signed)
Addended by: Roxy Manns A on: 04/09/2023 07:31 PM   Modules accepted: Orders

## 2023-04-09 NOTE — Assessment & Plan Note (Signed)
Right side  Worse with recent pressure change  Normal exam  Hearing is grossly intact   Will refer for carotid doppler (bilateral) Of note no bruits heard  Will send for last lipid profile from gyn   Instructed to update in meantime if symptoms change or worsen

## 2023-04-09 NOTE — Assessment & Plan Note (Signed)
Pt has had some dry skin on distal nipple (worse on right) -noted on both sides today  No nipple d/c or mass Has dense breast tissue   Recommend protection from friction and moisture  Trial of aquaphor prn  Update if not starting to improve in a week or if worsening -would consider imaging/ gyn follow up

## 2023-04-16 ENCOUNTER — Ambulatory Visit (HOSPITAL_COMMUNITY)
Admission: RE | Admit: 2023-04-16 | Discharge: 2023-04-16 | Disposition: A | Discharge status: Home / Self Care | Payer: BC Managed Care – PPO | Source: Ambulatory Visit | Attending: Family Medicine | Admitting: Family Medicine

## 2023-04-16 DIAGNOSIS — H93A9 Pulsatile tinnitus, unspecified ear: Secondary | ICD-10-CM | POA: Diagnosis present

## 2023-04-17 ENCOUNTER — Telehealth: Payer: Self-pay | Admitting: Family Medicine

## 2023-04-17 NOTE — Telephone Encounter (Signed)
If it is gone there is no further action needed Keep [place] posted and thanks for the update

## 2023-04-17 NOTE — Telephone Encounter (Signed)
Pt called stating she had a ultrasound done due a noise she had in her ear. Pt states she saw where Dr. Milinda Antis uploaded the results on mychart & asked the pt was she still able to hear the noise in her ear. Pt states she hasn't heard the noise in days, she believes It may be gone. Call back # [phone]

## 2023-04-17 NOTE — Telephone Encounter (Signed)
Pt notified of Dr. Tower's comments and verbalized understanding  

## 2023-06-25 ENCOUNTER — Ambulatory Visit (INDEPENDENT_AMBULATORY_CARE_PROVIDER_SITE_OTHER): Payer: BC Managed Care – PPO

## 2023-06-25 DIAGNOSIS — Z23 Encounter for immunization: Secondary | ICD-10-CM | POA: Diagnosis not present

## 2023-09-16 ENCOUNTER — Encounter (HOSPITAL_BASED_OUTPATIENT_CLINIC_OR_DEPARTMENT_OTHER): Payer: Self-pay | Admitting: Emergency Medicine

## 2023-09-16 ENCOUNTER — Other Ambulatory Visit: Payer: Self-pay

## 2023-09-16 ENCOUNTER — Ambulatory Visit: Payer: Self-pay | Admitting: Family Medicine

## 2023-09-16 ENCOUNTER — Emergency Department (HOSPITAL_BASED_OUTPATIENT_CLINIC_OR_DEPARTMENT_OTHER)
Admission: EM | Admit: 2023-09-16 | Discharge: 2023-09-16 | Disposition: A | Discharge status: Home / Self Care | Payer: 59 | Attending: Emergency Medicine | Admitting: Emergency Medicine

## 2023-09-16 DIAGNOSIS — D72829 Elevated white blood cell count, unspecified: Secondary | ICD-10-CM | POA: Diagnosis not present

## 2023-09-16 DIAGNOSIS — Z20822 Contact with and (suspected) exposure to covid-19: Secondary | ICD-10-CM | POA: Diagnosis not present

## 2023-09-16 DIAGNOSIS — R112 Nausea with vomiting, unspecified: Secondary | ICD-10-CM | POA: Insufficient documentation

## 2023-09-16 LAB — URINALYSIS, ROUTINE W REFLEX MICROSCOPIC
Bilirubin Urine: NEGATIVE
Glucose, UA: NEGATIVE mg/dL
Ketones, ur: NEGATIVE mg/dL
Leukocytes,Ua: NEGATIVE
Nitrite: NEGATIVE
Protein, ur: NEGATIVE mg/dL
Specific Gravity, Urine: 1.028 (ref 1.005–1.030)
pH: 6 (ref 5.0–8.0)

## 2023-09-16 LAB — CBC WITH DIFFERENTIAL/PLATELET
Abs Immature Granulocytes: 0.05 10*3/uL (ref 0.00–0.07)
Basophils Absolute: 0 10*3/uL (ref 0.0–0.1)
Basophils Relative: 0 %
Eosinophils Absolute: 0.1 10*3/uL (ref 0.0–0.5)
Eosinophils Relative: 1 %
HCT: 45 % (ref 36.0–46.0)
Hemoglobin: 15.2 g/dL — ABNORMAL HIGH (ref 12.0–15.0)
Immature Granulocytes: 0 %
Lymphocytes Relative: 4 %
Lymphs Abs: 0.5 10*3/uL — ABNORMAL LOW (ref 0.7–4.0)
MCH: 32.2 pg (ref 26.0–34.0)
MCHC: 33.8 g/dL (ref 30.0–36.0)
MCV: 95.3 fL (ref 80.0–100.0)
Monocytes Absolute: 0.6 10*3/uL (ref 0.1–1.0)
Monocytes Relative: 4 %
Neutro Abs: 12.8 10*3/uL — ABNORMAL HIGH (ref 1.7–7.7)
Neutrophils Relative %: 91 %
Platelets: 257 10*3/uL (ref 150–400)
RBC: 4.72 MIL/uL (ref 3.87–5.11)
RDW: 13 % (ref 11.5–15.5)
WBC: 14 10*3/uL — ABNORMAL HIGH (ref 4.0–10.5)
nRBC: 0 % (ref 0.0–0.2)

## 2023-09-16 LAB — COMPREHENSIVE METABOLIC PANEL
ALT: 94 U/L — ABNORMAL HIGH (ref 0–44)
AST: 49 U/L — ABNORMAL HIGH (ref 15–41)
Albumin: 4.3 g/dL (ref 3.5–5.0)
Alkaline Phosphatase: 59 U/L (ref 38–126)
Anion gap: 8 (ref 5–15)
BUN: 21 mg/dL (ref 8–23)
CO2: 32 mmol/L (ref 22–32)
Calcium: 9.8 mg/dL (ref 8.9–10.3)
Chloride: 95 mmol/L — ABNORMAL LOW (ref 98–111)
Creatinine, Ser: 0.7 mg/dL (ref 0.44–1.00)
GFR, Estimated: >60 mL/min (ref >60)
Glucose, Bld: 122 mg/dL — ABNORMAL HIGH (ref 70–99)
Potassium: 3.9 mmol/L (ref 3.5–5.1)
Sodium: 135 mmol/L (ref 135–145)
Total Bilirubin: 0.5 mg/dL (ref 0.0–1.2)
Total Protein: 7.6 g/dL (ref 6.5–8.1)

## 2023-09-16 LAB — RESP PANEL BY RT-PCR (RSV, FLU A&B, COVID)  RVPGX2
Influenza A by PCR: NEGATIVE
Influenza B by PCR: NEGATIVE
Resp Syncytial Virus by PCR: NEGATIVE
SARS Coronavirus 2 by RT PCR: NEGATIVE

## 2023-09-16 LAB — LIPASE, BLOOD: Lipase: 26 U/L (ref 11–51)

## 2023-09-16 MED ORDER — ONDANSETRON HCL 4 MG/2ML IJ SOLN
4.0000 mg | Freq: Once | INTRAMUSCULAR | Status: AC
  Administered 2023-09-16: 4 mg via INTRAVENOUS
  Filled 2023-09-16: qty 2

## 2023-09-16 MED ORDER — SODIUM CHLORIDE 0.9 % IV BOLUS
500.0000 mL | Freq: Once | INTRAVENOUS | Status: AC
  Administered 2023-09-16: 500 mL via INTRAVENOUS

## 2023-09-16 MED ORDER — ONDANSETRON 4 MG PO TBDP
4.0000 mg | ORAL_TABLET | Freq: Three times a day (TID) | ORAL | 0 refills | Status: AC | PRN
Start: 2023-09-16 — End: 2023-10-16

## 2023-09-16 NOTE — ED Provider Triage Note (Signed)
Emergency Medicine Provider Triage Evaluation Note  Grace [name] , a 63 y.o. female  was evaluated in triage.  Pt complains of N, V, D.  Review of Systems  Positive: Sick since 12:00, memory issues Negative: pain  Physical Exam  LMP 08/20/1988  Gen:   Awake, no distress   Resp:  Normal effort  MSK:   Moves extremities without difficulty  Other:    Medical Decision Making  Medically screening exam initiated at 4:58 PM.  Appropriate orders placed.  Grace [name] was informed that the remainder of the evaluation will be completed by another provider, this initial triage assessment does not replace that evaluation, and the importance of remaining in the ED until their evaluation is complete.  N, V, D started this afternoon, called husband home but doesn't remember calling for help. Cannot deny or confirm bloody emesis or diarrhea. Similar symptoms, including poor memory, several years ago.    Elpidio Anis, PA-C 09/16/23 1700

## 2023-09-16 NOTE — Discharge Instructions (Addendum)
As discussed, your labs are reassuring.  Make sure you are drinking plenty of fluids at home.  I sent a prescription of Zofran to the pharmacy.  You can take this up to 3 times a day as needed for nausea/vomiting.  Follow-up with your primary care provider within next 5 days for reevaluation.  Get help right away if: You have pain in your chest, neck, arm, or jaw. You feel extremely weak or you faint. You have persistent vomiting. You have vomit that is bright red or looks like black coffee grounds. You have bloody or black stools (feces) or stools that look like tar. You have a severe headache, a stiff neck, or both. You have severe pain, cramping, or bloating in your abdomen. You have difficulty breathing, or you are breathing very quickly. Your heart is beating very quickly. Your skin feels cold and clammy. You feel confused. You have signs of dehydration, such as: Dark urine, very little urine, or no urine. Cracked lips. Dry mouth. Sunken eyes. Sleepiness. Weakness.

## 2023-09-16 NOTE — Telephone Encounter (Signed)
In ED now  Will watch for correspondence

## 2023-09-16 NOTE — ED Triage Notes (Signed)
States had a couple of episodes of n/v/d starting around noon. Patient states she does not have memory of episodes. Husband states she has similar episodes in the past of "not remembering when she is sick" Denies hitting head. Patient is A&O x 4 and no focal deficits at the moment.

## 2023-09-16 NOTE — ED Notes (Signed)
Pt given ginger ale to fluid challenge at this time.

## 2023-09-16 NOTE — Telephone Encounter (Signed)
Chief Complaint: vomiting, diarrhea, confusion Symptoms: 5 episodes of vomiting, 5 episodes of diarrhea, "feels like I'm in a dream" and short term memory loss Frequency: today started around 11/11:30 this morning Pertinent Negatives: Patient denies blood or coffee ground emesis, recent head injury. Disposition: [x] ED /[] Urgent Care (no appt availability in office) / [] Appointment(In office/virtual)/ []  [facility]/ [] Home Care/ [] Refused Recommended Disposition /[] [facility]/ []  Follow-up with PCP Additional Notes: Patient and husband on phone for triage. He states last year she had a similar episode and was seen in the ED for dehydration. Husband states they are in the [facility] parking lot. Advised ED due to urgent care can not provide IV treatment and confirmed with urgent care. Husband asked if patient could just have OTC medicine and Pedialyte. Husband and wife agreeable to go to ED and educated on need for ED evaluation/treatment due to possible dehydration. Copied from CRM [id number] [id number]. Topic: Clinical - Red Word Triage >> Sep 16, 2023  3:19 PM Grace [name] wrote: Red Word that prompted transfer to Nurse Triage: Patient's husband reports she began having diarrhea and vomiting around 11-11:30 am. Last bout of diarrhea and vomit was around 1:30 pm. She is also having mental confusion. Has had similar symptoms about 2 years ago, was assessed in the ER and was thought to be dehydration. Mental confusion was much worse with that episode. Reason for Disposition  [1] MODERATE vomiting (e.g., 3 - 5 times/day) AND [2] age > 60 years  Answer Assessment - Initial Assessment Questions 1. VOMITING SEVERITY: "How many times have you vomited in the past 24 hours?"     - MILD:  1 - 2 times/day    - MODERATE: 3 - 5 times/day, decreased oral intake without significant weight loss or symptoms of dehydration    - SEVERE: 6 or more times/day, vomits everything or nearly everything,  with significant weight loss, symptoms of dehydration      5 episodes estimated since this AM.  2. ONSET: "When did the vomiting begin?"      Started today around 11/11:30AM.  3. FLUIDS: "What fluids or food have you vomited up today?" "Have you been able to keep any fluids down?"     "What's been coming up is yellow". She has drank about 12 ounces of ginger ale since 13:15pm with some vomiting after.  4. ABDOMEN PAIN: "Are your having any abdomen pain?" If Yes : "How bad is it and what does it feel like?" (e.g., crampy, dull, intermittent, constant)      Yes; states not at this time but it was hurting earlier.  5. DIARRHEA: "Is there any diarrhea?" If Yes, ask: "How many times today?"      Estimated 5 episodes of diarrhea.  6. CONTACTS: "Is there anyone else in the family with the same symptoms?"      Their daughter is also sick with symptoms and they went to church with her yesterday. Husband is not having any symptoms.  7. CAUSE: "What do you think is causing your vomiting?"     Unsure.  8. HYDRATION STATUS: "Any signs of dehydration?" (e.g., dry mouth [not only dry lips], too weak to stand) "When did you last urinate?"     Patient unable to remember when she last urinated. She feels dehydrated due to she feels like she can't remember anything "it's like I'm dreaming and I can't remember everything"  9. OTHER SYMPTOMS: "Do you have any other symptoms?" (e.g., fever,  headache, vertigo, vomiting blood or coffee grounds, recent head injury)     Confusion (patient states she can not remember throwing up and feels like she's dreaming)  Protocols used: Vomiting-A-AH

## 2023-09-16 NOTE — ED Provider Notes (Signed)
[place] EMERGENCY DEPARTMENT AT [facility] Provider Note   CSN: [id number] Arrival date & time: 09/16/23  1608     History  Chief Complaint  Patient presents with   Emesis    Grace [name] is a 63 y.o. female with a history of chronic pain and anxiety who presents the ED today for vomiting.  Patient reports multiple episodes of nausea, vomiting, and diarrhea that started at noon today and resolved around 2 PM.  She denies any known sick contacts or new foods.  States that she is feeling queasy when she came to the ED but was given Zofran improvement of symptoms.  Denies fevers, cough, congestion, chest pain, shortness of breath, or abdominal pain.  She has not tried taking any medication for symptoms.  Additionally, patient reports that she does not remember having diarrhea or vomiting today.  Her husband is at bedside and states that she had a previous episode of forgetfulness when she was last sick.  She had an extensive workup at the time and was negative for stroke.  At that time, patient's husband states that they associated with her forgetfulness with dehydration.  Patient denies any recent head injury or blood thinner use.  No additional complaints or concerns at this time.    Home Medications Prior to Admission medications   Medication Sig Start Date End Date Taking? Authorizing Provider  ondansetron (ZOFRAN-ODT) 4 MG disintegrating tablet Take 1 tablet (4 mg total) by mouth every 8 (eight) hours as needed for nausea or vomiting. 09/16/23 10/16/23 Yes Maxwell Marion, PA-C  baclofen (LIORESAL) 10 MG tablet Take 10 mg by mouth at bedtime as needed for muscle spasms.    [provider]  Calcium Carbonate-Vitamin D (CALCIUM-D PO) Take 1 tablet by mouth daily.    [provider]  DULoxetine (CYMBALTA) 30 MG capsule Take 1 capsule (30 mg total) by mouth 2 (two) times daily. Take 2 capsules (60 mg) by mouth every morning and take 1 capsule (30 mg) every evening  12/12/21   Sheikh, Kateri Mc Latif, DO  estradiol (VIVELLE-DOT) 0.0375 MG/24HR Place 1 patch onto the skin 2 (two) times a week. Tuesday and Friday    [provider]  hydrochlorothiazide (HYDRODIURIL) 25 MG tablet Take 1 tablet (25 mg total) by mouth daily. 12/15/21   Sheikh, Omair Latif, DO  IMVEXXY MAINTENANCE PACK 4 MCG INST Place 4 mcg vaginally 2 (two) times a week. Monday and Thursday 12/01/21   [provider]  Multiple Vitamin (MULTIVITAMIN) tablet Take 1 tablet by mouth daily.    [provider]  timolol (TIMOPTIC) 0.5 % ophthalmic solution 1 drop 2 (two) times daily. 12/27/20   [provider]  VYZULTA 0.024 % SOLN Apply 1 drop to eye daily. 01/15/23   [provider]      Allergies    Celecoxib, Codeine, Morphine, Propoxyphene n-acetaminophen, Tramadol hcl, Metronidazole, Pneumococcal vaccines, and Sulfa antibiotics    Review of Systems   Review of Systems  Gastrointestinal:  Positive for vomiting.  All other systems reviewed and are negative.   Physical Exam Updated Vital Signs BP (!) 147/70   Pulse 82   Temp 98.7 F (37.1 C) (Oral)   Resp 18   Ht 5\' 5"  (1.651 m)   Wt 65.8 kg   LMP 08/20/1988   SpO2 100%   BMI 24.13 kg/m  Physical Exam Vitals and nursing note reviewed.  Constitutional:      General: She is not in acute distress.  Appearance: Normal appearance.  HENT:     Head: Normocephalic and atraumatic.     Mouth/Throat:     Mouth: Mucous membranes are moist.  Eyes:     Conjunctiva/sclera: Conjunctivae normal.     Pupils: Pupils are equal, round, and reactive to light.  Cardiovascular:     Rate and Rhythm: Normal rate and regular rhythm.     Pulses: Normal pulses.     Heart sounds: Normal heart sounds.  Pulmonary:     Effort: Pulmonary effort is normal.     Breath sounds: Normal breath sounds.  Abdominal:     Palpations: Abdomen is soft.     Tenderness: There is no abdominal tenderness.  Musculoskeletal:         General: Normal range of motion.     Cervical back: Normal range of motion.  Skin:    General: Skin is warm and dry.     Findings: No rash.  Neurological:     General: No focal deficit present.     Mental Status: She is alert.     Sensory: No sensory deficit.     Motor: No weakness.  Psychiatric:        Mood and Affect: Mood normal.        Behavior: Behavior normal.     ED Results / Procedures / Treatments   Labs (all labs ordered are listed, but only abnormal results are displayed) Labs Reviewed  CBC WITH DIFFERENTIAL/PLATELET - Abnormal; Notable for the following components:      Result Value   WBC 14.0 (*)    Hemoglobin 15.2 (*)    Neutro Abs 12.8 (*)    Lymphs Abs 0.5 (*)    All other components within normal limits  COMPREHENSIVE METABOLIC PANEL - Abnormal; Notable for the following components:   Chloride 95 (*)    Glucose, Bld 122 (*)    AST 49 (*)    ALT 94 (*)    All other components within normal limits  URINALYSIS, ROUTINE W REFLEX MICROSCOPIC - Abnormal; Notable for the following components:   Hgb urine dipstick TRACE (*)    Bacteria, UA FEW (*)    All other components within normal limits  RESP PANEL BY RT-PCR (RSV, FLU A&B, COVID)  RVPGX2  LIPASE, BLOOD    EKG None  Radiology No results found.  Procedures Procedures: not indicated.   Medications Ordered in ED Medications  ondansetron (ZOFRAN) injection 4 mg (4 mg Intravenous Given 09/16/23 1846)  sodium chloride 0.9 % bolus 500 mL (500 mLs Intravenous New Bag/Given 09/16/23 2045)    ED Course/ Medical Decision Making/ A&P                                 Medical Decision Making  This patient presents to the ED for concern of nausea/vomiting, this involves an extensive number of treatment options, and is a complaint that carries with it a high risk of complications and morbidity.   Differential diagnosis includes: Flu, COVID, RSV, gastroenteritis, IBS, IBD, electrolyte derangement, AKI,  dehydration, transient global amnesia, stroke, etc. Low suspicion for stroke - no neurologic focal deficits. The same thing occurred about a year or so ago with no acute findings. Patient does not want imaging at this time.   Comorbidities  See HPI above   Additional History  Additional history obtained from prior records.   Lab Tests  I ordered and personally interpreted  labs.  The pertinent results include:   Negative respiratory panel UA is unremarkable AST of 49, ALT of 94, otherwise CMP is within normal limits CBC has WBC of 14.9 otherwise within normal limits Patient remarkable   Problem List / ED Course / Critical Interventions / Medication Management  Transient amnesia with nausea, vomiting, diarrhea earlier today.  Episode lasted about 2 hours. Offered imaging for patient which she declined stating that she has had it all done for negative.  She denies any vision changes, headaches, slurred speech, or weakness. Patient staffed with my attending, Dr. Andria Meuse I ordered medications including: Zofran for nausea/vomiting Normal saline for dehydration  Reevaluation of the patient after these medicines showed that the patient improved.  Patient able to tolerate ginger ale without complication. I have reviewed the patients home medicines and have made adjustments as needed   Social Determinants of Health  Access to healthcare   Test / Admission - Considered  Discussed findings with patient and husband at bedside.  All questions were answered. She is stable and safe for discharge home. Return precautions provided.       Final Clinical Impression(s) / ED Diagnoses Final diagnoses:  Nausea and vomiting, unspecified vomiting type    Rx / DC Orders ED Discharge Orders          Ordered    ondansetron (ZOFRAN-ODT) 4 MG disintegrating tablet  Every 8 hours PRN        09/16/23 2117              Maxwell Marion, PA-C 09/16/23 2139    Anders Simmonds T,  DO 09/17/23 2306

## 2023-11-15 LAB — HM MAMMOGRAPHY

## 2023-12-10 ENCOUNTER — Encounter: Payer: Self-pay | Admitting: Family Medicine

## 2023-12-10 ENCOUNTER — Ambulatory Visit: Admitting: Family Medicine

## 2023-12-10 VITALS — BP 134/78 | HR 88 | Temp 97.8°F | Ht 65.0 in | Wt 155.1 lb

## 2023-12-10 DIAGNOSIS — M546 Pain in thoracic spine: Secondary | ICD-10-CM | POA: Diagnosis not present

## 2023-12-10 DIAGNOSIS — M5030 Other cervical disc degeneration, unspecified cervical region: Secondary | ICD-10-CM

## 2023-12-10 DIAGNOSIS — M79601 Pain in right arm: Secondary | ICD-10-CM | POA: Diagnosis not present

## 2023-12-10 DIAGNOSIS — R234 Changes in skin texture: Secondary | ICD-10-CM | POA: Insufficient documentation

## 2023-12-10 DIAGNOSIS — Z780 Asymptomatic menopausal state: Secondary | ICD-10-CM | POA: Insufficient documentation

## 2023-12-10 MED ORDER — PREDNISONE 10 MG PO TABS: ORAL_TABLET | ORAL | 0 refills | Status: AC

## 2023-12-10 NOTE — Assessment & Plan Note (Signed)
 Pt recently came off of her low dose hormone patch due to daughter's dx of breast cancer (daughter had negative genetic testing)   Her aches and pains worsened (she has chronic spinal and hip pain)  Suspect this may be due to menopause/along with worse sleep  Antic guidance given

## 2023-12-10 NOTE — Assessment & Plan Note (Signed)
 Some throacic muscle spasm on right with tenderness No bony tenderness   Normal shoulder exam

## 2023-12-10 NOTE — Assessment & Plan Note (Addendum)
 Suspect radicular with history of DDD CS  Also some thoracic pain  Reassuring shoulder exam   Prednisone  prescription 40 mg taper given (discussed possible side effects)  Watch for rash  Gentle rom exercises  Recommend follow up with Dr Rexanne Catalina  Update if not starting to improve in a week or if worsening  Call back and Er precautions noted in detail today

## 2023-12-10 NOTE — Assessment & Plan Note (Addendum)
 Pt has arm symptoms- hyper sensitivity and pain  Suspect this is radicular from CS DDD  Reviewed last MRI noting narrowing at C3-4, 5-6 and 6-7  No neuro changes on exam  Normal rom of shoulder   Prednisone  prescription given 40 mg taper  Heat/ice/ baclofen prn  Recommend follow up with Dr Rexanne Catalina who has treated her before

## 2023-12-10 NOTE — Assessment & Plan Note (Signed)
 Pt has scab on right nipple that came off in office (2 mm max)  Small irritated area on tip of nipple  No nipple d/c on exam/ no lumps Sent for recent mammo report from gyn   Encouraged to use antibiotic ointment  Protect area from friction   Update if not starting to improve in a week or if worsening  Call back and Er precautions noted in detail today

## 2023-12-10 NOTE — Patient Instructions (Addendum)
 Start using antibiotic ointment on nipple and let us  know if if does not improve   Watch for rash on neck , back, breast or arm    I think arm pain may be coming from a pinched nerve  Use heat/ice if helpful  Baclofen as tolerated up to every 8 hours   Try the prednisone  taper as directed  (may cause you to feel hyper and hungry)   I want to get you back to Dr Rexanne Catalina  Go ahead and schedule an appointment (if you need a referral let us  know)

## 2023-12-10 NOTE — Progress Notes (Signed)
 Subjective:    Patient ID: Grace [name], female    DOB: 06-30-1961, 63 y.o.   MRN: [id number]  HPI  Wt Readings from Last 3 Encounters:  12/10/23 155 lb 2 oz (70.4 kg)  09/16/23 145 lb (65.8 kg)  04/09/23 154 lb 2 oz (69.9 kg)   25.81 kg/m  Vitals:   12/10/23 1448  BP: 134/78  Pulse: 88  Temp: 97.8 F (36.6 C)  SpO2: 99%    Pt presents with  Shoulder/arm pain on the right  Desires breast check   (last mammogram 10/2023 and was clear)  due to scab on nipple   Last Wednesday- vacuumed car/ lifted suit cases  Woke up next day with sore shoulder  Took her baclofen (several times daily)  By Friday pain radiated down into her arm and into her throacic spine  Tried heat and ice  Tried sleeping on left side-no improvement / also tried sleeping on back  Uncomfortable to drive with right arm / and also to sit upright  No problems moving arm   She has pushed through - house work and cooking   Worst spot now is just proximal to elbow   Last mri of neck was 2022  IMPRESSION: Mild spinal canal and bilateral neural foraminal narrowing at the C3-4, C5-6 levels.   Mild spinal canal narrowing at C4-5. Mild right C6-7 neural foraminal narrowing.  Has seen Dr Rexanne Catalina in past  (back /neck injections/ nerve blocks)  Dr Bernard Brick for lower issues  Dr Larrie Po for neuro surg   (no surgery in the past)   Has chronic lumbar and cervical disk dz Also troch bursitis  Has seen ortho and done PT   Has chronic pain  Takes  Cymbalta   Baclofen    Right breast complaint  Had a rash close to the nipple -used neosporin on it  Now scabbed area  No lumps   Her daughter has breast cancer  Negative generic testing  Came off hormone patch  More aches and pains since    Patient Active Problem List   Diagnosis Date Noted   Acute right-sided thoracic back pain 12/10/2023   Right arm pain 12/10/2023   Scab 12/10/2023   Menopause 12/10/2023   Nipple dermatitis 04/09/2023   Pulsatile  tinnitus 04/09/2023   Low calcium levels 12/15/2021   Acute encephalopathy 12/11/2021   GAD (generalized anxiety disorder) 02/17/2021   Chronic pain 02/17/2021   Abnormal thyroid  blood test 11/14/2020   Abnormal imaging of thyroid  10/31/2020   DDD (degenerative disc disease), cervical 03/06/2018   NONSPEC ELEVATION OF LEVELS OF TRANSAMINASE/LDH 08/25/2010   MENOPAUSAL SYNDROME 07/12/2009   Allergic rhinitis 09/16/2008   Past Medical History:  Diagnosis Date   Allergic rhinitis, cause unspecified    Back pain    epidural injections   Foot fracture    Headache(784.0)    Nonspecific elevation of levels of transaminase or lactic acid dehydrogenase (LDH)    Pain in joint, site unspecified    Symptomatic menopausal or female climacteric states    Unspecified gastritis and gastroduodenitis without mention of hemorrhage    Unspecified sinusitis (chronic)    Past Surgical History:  Procedure Laterality Date   CCY  2006   COLONOSCOPY     ESOPHAGOGASTRODUODENOSCOPY  2006   gastritis   FOOT FRACTURE SURGERY  2010   Social History   Tobacco Use   Smoking status: Never   Smokeless tobacco: Never  Substance Use Topics   Alcohol use: No  Drug use: No   Family History  Problem Relation Age of Onset   Other Father        disk disease   Other Brother        disk disease   Arthritis Other        paternal side   Breast cancer Daughter    Allergies  Allergen Reactions   Celecoxib Hives and Nausea And Vomiting    rash, migraine   Codeine Nausea And Vomiting    rash, Syncope, Migraine HA    Morphine Hives and Itching   Propoxyphene N-Acetaminophen Hives and Itching   Tramadol Hcl Hives and Itching   Metronidazole Rash   Pneumococcal Vaccines Rash    Local reaction on arm. Redness swelling and rash   Sulfa Antibiotics Rash   Current Outpatient Medications on File Prior to Visit  Medication Sig Dispense Refill   baclofen (LIORESAL) 10 MG tablet Take 10 mg by mouth at  bedtime as needed for muscle spasms.     Calcium Carbonate-Vitamin D (CALCIUM-D PO) Take 1 tablet by mouth daily.     DULoxetine  (CYMBALTA ) 30 MG capsule Take 1 capsule (30 mg total) by mouth 2 (two) times daily. Take 2 capsules (60 mg) by mouth every morning and take 1 capsule (30 mg) every evening     hydrochlorothiazide  (HYDRODIURIL ) 25 MG tablet Take 1 tablet (25 mg total) by mouth daily.     INTRAROSA 6.5 MG INST Place 1 Application vaginally daily.     Multiple Vitamin (MULTIVITAMIN) tablet Take 1 tablet by mouth daily.     timolol (TIMOPTIC) 0.5 % ophthalmic solution 1 drop 2 (two) times daily.     VYZULTA 0.024 % SOLN Apply 1 drop to eye daily.     No current facility-administered medications on file prior to visit.    Review of Systems  Constitutional:  Negative for activity change, appetite change, fatigue, fever and unexpected weight change.  HENT:  Negative for congestion, rhinorrhea, sore throat and trouble swallowing.   Eyes:  Negative for pain, redness, itching and visual disturbance.  Respiratory:  Negative for cough, chest tightness, shortness of breath and wheezing.   Cardiovascular:  Negative for chest pain and palpitations.  Gastrointestinal:  Negative for abdominal pain, blood in stool, constipation, diarrhea and nausea.  Endocrine: Negative for cold intolerance, heat intolerance, polydipsia and polyuria.  Genitourinary:  Negative for difficulty urinating, dysuria, frequency and urgency.  Musculoskeletal:  Positive for arthralgias, back pain and neck pain. Negative for joint swelling and myalgias.  Skin:  Negative for pallor and rash.  Neurological:  Negative for dizziness, tremors, weakness, numbness and headaches.       Skin on right arm feels hyper sensitive   Hematological:  Negative for adenopathy. Does not bruise/bleed easily.  Psychiatric/Behavioral:  Negative for decreased concentration and dysphoric mood. The patient is not nervous/anxious.         Objective:   Physical Exam Constitutional:      General: She is not in acute distress.    Appearance: Normal appearance. She is normal weight. She is not ill-appearing.  Eyes:     General: No scleral icterus.       Right eye: No discharge.        Left eye: No discharge.     Conjunctiva/sclera: Conjunctivae normal.     Pupils: Pupils are equal, round, and reactive to light.  Cardiovascular:     Rate and Rhythm: Normal rate and regular rhythm.  Heart sounds: Normal heart sounds.  Pulmonary:     Effort: Pulmonary effort is normal. No respiratory distress.     Breath sounds: Normal breath sounds. No wheezing.  Genitourinary:    Comments: Breast exam:right No mass, nodules, thickening, tenderness, bulging, retraction, inflamation, or nipple discharge   No axillary or clavicular LA.     Small scab came off of tip of nipple with mild irritation underneath (is tender to palp)   Musculoskeletal:     Right shoulder: No swelling, deformity, effusion, tenderness, bony tenderness or crepitus. Normal range of motion. Normal strength. Normal pulse.     Cervical back: Neck supple. Crepitus present. No swelling, deformity or bony tenderness. Pain with movement present. Decreased range of motion.     Thoracic back: Spasms and tenderness present. No swelling, deformity or bony tenderness.     Comments: Some tenderness in right rhomboid area with spasm  No bony tenderness Right cervical muscles are more tight than left   Some tenderness of upper arm and lateral epicondyle of right elbow   Normal rom of shoulder Neg hawking and neer tests   Lymphadenopathy:     Cervical: No cervical adenopathy.  Skin:    General: Skin is warm and dry.     Findings: No bruising, erythema or rash.     Comments: 1-2 mm scab (dark) on tip of right nipple with skin irritation underneath (less than 0.5 cm)  Removed with friction No nipple discharge    Neurological:     Mental Status: She is alert.     Cranial  Nerves: No cranial nerve deficit.     Motor: No weakness.     Gait: Gait normal.     Deep Tendon Reflexes: Reflexes normal.  Psychiatric:        Mood and Affect: Mood normal.           Assessment & Plan:   Problem List Items Addressed This Visit       Musculoskeletal and Integument   Scab   Pt has scab on right nipple that came off in office (2 mm max)  Small irritated area on tip of nipple  No nipple d/c on exam/ no lumps Sent for recent mammo report from gyn   Encouraged to use antibiotic ointment  Protect area from friction   Update if not starting to improve in a week or if worsening  Call back and Er precautions noted in detail today        DDD (degenerative disc disease), cervical   Pt has arm symptoms- hyper sensitivity and pain  Suspect this is radicular from CS DDD  Reviewed last MRI noting narrowing at C3-4, 5-6 and 6-7  No neuro changes on exam  Normal rom of shoulder   Prednisone  prescription given 40 mg taper  Heat/ice/ baclofen prn  Recommend follow up with Dr Rexanne Catalina who has treated her before          Other   Right arm pain - Primary   Suspect radicular with history of DDD CS  Also some thoracic pain  Reassuring shoulder exam   Prednisone  prescription 40 mg taper given (discussed possible side effects)  Watch for rash  Gentle rom exercises  Recommend follow up with Dr Rexanne Catalina  Update if not starting to improve in a week or if worsening  Call back and Er precautions noted in detail today        Menopause   Pt recently came off of her low dose  hormone patch due to daughter's dx of breast cancer (daughter had negative genetic testing)   Her aches and pains worsened (she has chronic spinal and hip pain)  Suspect this may be due to menopause/along with worse sleep  Antic guidance given       Acute right-sided thoracic back pain   Some throacic muscle spasm on right with tenderness No bony tenderness   Normal shoulder exam         Relevant Medications   predniSONE  (DELTASONE ) 10 MG tablet

## 2023-12-24 ENCOUNTER — Telehealth: Payer: Self-pay | Admitting: Family Medicine

## 2023-12-24 MED ORDER — FLUCONAZOLE 150 MG PO TABS: 150.0000 mg | ORAL_TABLET | Freq: Once | ORAL | 0 refills | Status: AC

## 2023-12-24 NOTE — Telephone Encounter (Signed)
 Please ask what she wants it for since not on list

## 2023-12-24 NOTE — Addendum Note (Signed)
 Addended by: Deri Fleet A on: 12/24/2023 04:20 PM   Modules accepted: Orders

## 2023-12-24 NOTE — Telephone Encounter (Signed)
Pt notified of Dr. Tower comments and verbalized understanding  

## 2023-12-24 NOTE — Telephone Encounter (Signed)
 Called and spoke with pt who states she's been having a vaginal burning sensation for the past few days. Believes it to be a yeast infection due to antibiotics (prednisone ) she was taking.

## 2023-12-24 NOTE — Telephone Encounter (Signed)
 Patient is requesting Fluconazole  Rx this is not active on the med list. Please advise.   Does patient need an appointment to discuss this?

## 2023-12-24 NOTE — Telephone Encounter (Signed)
 I sent it  Follow up if not improved

## 2023-12-24 NOTE — Telephone Encounter (Signed)
 Copied from CRM [id number]. Topic: Clinical - Medication Refill >> Dec 24, 2023  8:45 AM Bobbie Burows wrote: Most Recent Primary Care Visit:  Provider: Deri Fleet A  Department: [facility]  Visit Type: ACUTE  Date: 12/10/2023  Medication: Fluconazole   Has the patient contacted their pharmacy? No (Agent: If no, request that the patient contact the pharmacy for the refill. If patient does not wish to contact the pharmacy document the reason why and proceed with request.) (Agent: If yes, when and what did the pharmacy advise?)  Is this the correct pharmacy for this prescription? Yes If no, delete pharmacy and type the correct one.  This is the patient's preferred pharmacy:  [id number] [place], [place] - [address] [place] [address] Phone: [phone] Fax: [phone]  [id number] - [address] [place] - [address] [place] [address] Phone: [phone] Fax: [phone]   Has the prescription been filled recently? Yes  Is the patient out of the medication? Yes  Has the patient been seen for an appointment in the last year OR does the patient have an upcoming appointment? Yes  Can we respond through MyChart? Yes  Agent: Please be advised that Rx refills may take up to 3 business days. We ask that you follow-up with your pharmacy.

## 2024-07-28 DIAGNOSIS — N952 Postmenopausal atrophic vaginitis: Secondary | ICD-10-CM | POA: Diagnosis not present

## 2024-07-28 DIAGNOSIS — Z01419 Encounter for gynecological examination (general) (routine) without abnormal findings: Secondary | ICD-10-CM | POA: Diagnosis not present

## 2024-07-28 DIAGNOSIS — Z124 Encounter for screening for malignant neoplasm of cervix: Secondary | ICD-10-CM | POA: Diagnosis not present

## 2024-07-28 DIAGNOSIS — Z6827 Body mass index (BMI) 27.0-27.9, adult: Secondary | ICD-10-CM | POA: Diagnosis not present

## 2024-08-06 ENCOUNTER — Ambulatory Visit: Admitting: Internal Medicine

## 2024-08-06 ENCOUNTER — Ambulatory Visit: Payer: Self-pay

## 2024-08-06 VITALS — BP 120/72 | HR 82 | Temp 98.1°F | Resp 16 | Ht 65.0 in | Wt 155.5 lb

## 2024-08-06 DIAGNOSIS — J029 Acute pharyngitis, unspecified: Secondary | ICD-10-CM | POA: Diagnosis not present

## 2024-08-06 DIAGNOSIS — J3489 Other specified disorders of nose and nasal sinuses: Secondary | ICD-10-CM | POA: Diagnosis not present

## 2024-08-06 DIAGNOSIS — J0100 Acute maxillary sinusitis, unspecified: Secondary | ICD-10-CM | POA: Diagnosis not present

## 2024-08-06 LAB — POC COVID19/FLU A&B COMBO
Covid Antigen, POC: NEGATIVE
Influenza A Antigen, POC: NEGATIVE
Influenza B Antigen, POC: NEGATIVE

## 2024-08-06 LAB — POCT RAPID STREP A (OFFICE): Rapid Strep A Screen: NEGATIVE

## 2024-08-06 MED ORDER — AMOXICILLIN-POT CLAVULANATE 875-125 MG PO TABS: 1.0000 | ORAL_TABLET | Freq: Two times a day (BID) | ORAL | 0 refills | Status: AC

## 2024-08-06 MED ORDER — FLUCONAZOLE 150 MG PO TABS: 150.0000 mg | ORAL_TABLET | Freq: Once | ORAL | 0 refills | Status: AC

## 2024-08-06 NOTE — Telephone Encounter (Signed)
 Aware, will watch for correspondence Thanks for seeing her

## 2024-08-06 NOTE — Telephone Encounter (Signed)
 FYI Only or Action Required?: FYI only for provider: appointment scheduled on 08/06/24.  Patient was last seen in primary care on 12/10/2023 by Randeen Laine LABOR, MD.  Called Nurse Triage reporting Sinusitis, Nasal Congestion, and Cough.  Symptoms began a week ago.  Interventions attempted: OTC medications: Sudafed; Advil cold and sinus.  Symptoms are: gradually worsening.  Triage Disposition: See Physician Within 24 Hours  Patient/caregiver understands and will follow disposition?: Yes            Copied from CRM #[id number]. Topic: Clinical - Red Word Triage >> Aug 06, 2024  7:42 AM Berneda FALCON wrote: Red Word that prompted transfer to Nurse Triage: Sinus issues last week into this week. This week it has gotten worse, headache last night and today.   Symptoms: congestion, headache, facial pressure, drainage, coughing has lessened Reason for Disposition  Earache  Answer Assessment - Initial Assessment Questions 1. LOCATION: Where does it hurt?      Forehead, around cheekbones, sometimes going down ears and neck.  2. ONSET: When did the sinus pain start?  (e.g., hours, days)      Thursday, 1 week.  3. SEVERITY: How bad is the pain?   (Scale 0-10; or none, mild, moderate or severe)     4/10, worse at night.  4. RECURRENT SYMPTOM: Have you ever had sinus problems before? If Yes, ask: When was the last time? and What happened that time?      Yes, she is unsure of last sinus infection.  5. NASAL CONGESTION: Is the nose blocked? If Yes, ask: Can you open it or must you breathe through your mouth?     She states it is not loosening up much, stays congested.   6. NASAL DISCHARGE: Do you have discharge from your nose? If so ask, What color?     Clear and bloody discharge.  7. FEVER: Do you have a fever? If Yes, ask: What is it, how was it measured, and when did it start?      No.  8. OTHER SYMPTOMS: Do you have any other symptoms? (e.g., sore throat,  cough, earache, difficulty breathing)     Sore throat, cough, nasal congestion.  Protocols used: Sinus Pain or Congestion-A-AH

## 2024-08-06 NOTE — Progress Notes (Signed)
 Acute Office Visit  Subjective:     Patient ID: Grace [name], female    DOB: 10-Oct-1960, 63 y.o.   MRN: [id number]  Chief Complaint  Patient presents with   Sinusitis    Sore throat, ear pain, nasal congestion, sinus pressure for 1 week    Sinusitis Associated symptoms include congestion, ear pain and a sore throat. Pertinent negatives include no chills, coughing or shortness of breath.   Patient is in today for sore throat, bilateral ear pain and congestion x 1 week.   Discussed the use of AI scribe software for clinical note transcription with the patient, who gave verbal consent to proceed.  History of Present Illness Grace [name] is a 63 year old female who presents with sinus congestion, sore throat, and headache.  Symptoms started about one week ago with a left-sided sore throat that progressed to severe bilateral pain and difficulty swallowing, along with sinus congestion and headache. She also has ear pain radiating from her cheekbones to her neck.  She was recently exposed to young grandchildren with colds. Sinus congestion was initially productive and is now clear with occasional blood-tinged discharge.  She has tried Sudafed, Delsym, Flonase, Tylenol, and an over-the-counter sinus medication with limited relief. She is sensitive to medications and temporarily stopped her usual NSAID. She is currently using nasal saline, Sudafed, Tylenol, and a probiotic.  She has had no fever. An initial cough has resolved.   Review of Systems  Constitutional:  Negative for chills and fever.  HENT:  Positive for congestion, ear pain, sinus pain and sore throat.   Respiratory:  Negative for cough, shortness of breath and wheezing.         Objective:    BP 120/72 (Cuff Size: Large)   Pulse 82   Temp 98.1 F (36.7 C) (Oral)   Resp 16   Ht 5' 5 (1.651 m)   Wt 155 lb 8 oz (70.5 kg)   LMP 08/20/1988   SpO2 99%   BMI 25.88 kg/m  BP Readings from Last 3 Encounters:   08/06/24 120/72  12/10/23 134/78  09/16/23 136/68   Wt Readings from Last 3 Encounters:  08/06/24 155 lb 8 oz (70.5 kg)  12/10/23 155 lb 2 oz (70.4 kg)  09/16/23 145 lb (65.8 kg)      Physical Exam Constitutional:      Appearance: Normal appearance.  HENT:     Head: Normocephalic and atraumatic.     Comments: Tenderness to palpation over left maxillary sinus     Right Ear: Tympanic membrane, ear canal and external ear normal.     Left Ear: Tympanic membrane, ear canal and external ear normal.     Nose: Congestion present.     Mouth/Throat:     Mouth: Mucous membranes are moist.     Pharynx: Posterior oropharyngeal erythema present.  Eyes:     Conjunctiva/sclera: Conjunctivae normal.  Cardiovascular:     Rate and Rhythm: Normal rate and regular rhythm.  Pulmonary:     Effort: Pulmonary effort is normal.     Breath sounds: Normal breath sounds. No wheezing, rhonchi or rales.  Skin:    General: Skin is warm and dry.  Neurological:     General: No focal deficit present.     Mental Status: She is alert. Mental status is at baseline.  Psychiatric:        Mood and Affect: Mood normal.        Behavior: Behavior normal.  Results for orders placed or performed in visit on 08/06/24  POC Covid19/Flu A&B Antigen  Result Value Ref Range   Influenza A Antigen, POC Negative Negative   Influenza B Antigen, POC Negative Negative   Covid Antigen, POC Negative Negative  POCT rapid strep A  Result Value Ref Range   Rapid Strep A Screen Negative Negative        Assessment & Plan:   Assessment & Plan Acute maxillary sinusitis and pharyngitis Symptoms include sore throat, ear pain, and sinus congestion. Negative rapid strep, COVID, and flu tests. Augmentin  prescribed for sinus infection. Throat culture ordered to confirm strep throat. Discussed Augmentin  side effects and recommended probiotics. Advised on decongestant use and blood pressure monitoring due to hypertension. -  Prescribed Augmentin  twice daily for five days. - Ordered throat culture to confirm strep throat. - Recommended probiotic use to prevent antibiotic-associated diarrhea. - Advised use of nasal saline for congestion relief. - Recommended hot tea with honey for throat soothing. - Suggested benzocaine spray for throat pain relief. - Advised use of Sudafed for sinus congestion, with caution regarding blood pressure. - Instructed to monitor blood pressure at home if taking Sudafed. - Advised against Mucinex due to lack of chest congestion. - Recommended Tylenol for headache management, not exceeding 3000 mg per day.  - Culture, Group A Strep - POC Covid19/Flu A&B Antigen - POCT rapid strep A - amoxicillin -clavulanate (AUGMENTIN ) 875-125 MG tablet; Take 1 tablet by mouth 2 (two) times daily for 5 days.  Dispense: 10 tablet; Refill: 0 - fluconazole  (DIFLUCAN ) 150 MG tablet; Take 1 tablet (150 mg total) by mouth once for 1 dose.  Dispense: 1 tablet; Refill: 0   Return if symptoms worsen or fail to improve.  Sharyle Fischer, DO

## 2024-08-08 LAB — CULTURE, GROUP A STREP
Micro Number: 17374470
SPECIMEN QUALITY:: ADEQUATE

## 2024-08-14 ENCOUNTER — Ambulatory Visit: Payer: Self-pay | Admitting: Internal Medicine
